# Patient Record
Sex: Female | Born: 2000 | Race: White | Hispanic: No | Marital: Single | Smoking: Current every day smoker
Health system: Southern US, Community
[De-identification: ages and names within clinical notes are randomized; demographics above are authoritative.]

## PROBLEM LIST (undated history)

## (undated) DIAGNOSIS — F329 Major depressive disorder, single episode, unspecified: Secondary | ICD-10-CM

## (undated) DIAGNOSIS — E119 Type 2 diabetes mellitus without complications: Secondary | ICD-10-CM

## (undated) DIAGNOSIS — J45909 Unspecified asthma, uncomplicated: Secondary | ICD-10-CM

## (undated) DIAGNOSIS — F32A Depression, unspecified: Secondary | ICD-10-CM

## (undated) DIAGNOSIS — IMO0002 Reserved for concepts with insufficient information to code with codable children: Secondary | ICD-10-CM

## (undated) DIAGNOSIS — F419 Anxiety disorder, unspecified: Secondary | ICD-10-CM

## (undated) HISTORY — PX: ADENOIDECTOMY: SUR15

## (undated) HISTORY — PX: OTHER SURGICAL HISTORY: SHX169

## (undated) HISTORY — DX: Anxiety disorder, unspecified: F41.9

## (undated) HISTORY — DX: Depression, unspecified: F32.A

## (undated) HISTORY — DX: Unspecified asthma, uncomplicated: J45.909

## (undated) HISTORY — DX: Type 2 diabetes mellitus without complications: E11.9

## (undated) HISTORY — DX: Major depressive disorder, single episode, unspecified: F32.9

## (undated) HISTORY — PX: TONSILLECTOMY: SUR1361

---
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
## 2018-06-05 NOTE — Progress Notes (Signed)
Subjective: QI:HKVQQVZDG care, ovarian pain HPI: Shelby Rubio is a 17 y.o. female presenting to clinic today for:  1.  Ovarian pain Patient reports a 4 to 28-monthhistory of ovarian pain.  She notes that it can occur on either side but the right seems to be more affected.  Pain seems worse within 2 weeks of her menstrual cycle and gradually gets worse as she approaches her menstrual cycle.  She reports associated nausea when the pain occurs.  Typically the pain lasts about 5 to 10 minutes then resolves.  Sometimes independently but sometimes she has to take ibuprofen to get it to go away.  The pain is sharp.  Often, she is left with residual aching and soreness.  She denies any abnormal vaginal discharge, dysuria, vaginal lesions, vaginal itching, vomiting, diarrhea.  She does occasionally have constipation.  Denies any rectal bleeding.  She is sexually active with one partner.  She does not use any contraception.  She was previously on OCPs but states that it seemed to make symptoms worse so she discontinued them several months ago.  Last menstrual cycle was 05/16/2018 and was very heavy.  She notes prior to that she had consecutive menstrual cycles every couple of weeks.  She has been screened for STDs in the past and reports she was negative.  Denies any known thyroid disorders in the family.  Denies any history of ovarian cancer, breast cancer, uterine cancer prostate cancers in the family.  No known family history of liver dysfunction or clotting disorders.  Past Medical History:  Diagnosis Date  . Behavior disorder    Past Surgical History:  Procedure Laterality Date  . ADENOIDECTOMY    . TONSILLECTOMY    . tubes in ears     Social History   Socioeconomic History  . Marital status: Single    Spouse name: Not on file  . Number of children: Not on file  . Years of education: Not on file  . Highest education level: Not on file  Occupational History  . Not on file  Social Needs  .  Financial resource strain: Not on file  . Food insecurity:    Worry: Not on file    Inability: Not on file  . Transportation needs:    Medical: Not on file    Non-medical: Not on file  Tobacco Use  . Smoking status: Never Smoker  . Smokeless tobacco: Never Used  Substance and Sexual Activity  . Alcohol use: No  . Drug use: No  . Sexual activity: Not on file  Lifestyle  . Physical activity:    Days per week: Not on file    Minutes per session: Not on file  . Stress: Not on file  Relationships  . Social connections:    Talks on phone: Not on file    Gets together: Not on file    Attends religious service: Not on file    Active member of club or organization: Not on file    Attends meetings of clubs or organizations: Not on file    Relationship status: Not on file  . Intimate partner violence:    Fear of current or ex partner: Not on file    Emotionally abused: Not on file    Physically abused: Not on file    Forced sexual activity: Not on file  Other Topics Concern  . Not on file  Social History Narrative  . Not on file   No outpatient medications have  been marked as taking for the 06/06/18 encounter (Appointment) with [name], DO.   Family History  Problem Relation Age of Onset  . Diabetes Other   . Asthma Sister   . Asthma Brother    Allergies  Allergen Reactions  . Amoxil [Amoxicillin] Hives  . Penicillins Hives     Health Maintenance: Declines flu shot  ROS: Per HPI  Objective: Office vital signs reviewed. BP 129/83   Pulse 93   Temp 98.3 F (36.8 C) (Oral)   Ht 5' 4"  (1.626 m)   Wt 210 lb (95.3 kg)   BMI 36.05 kg/m   Physical Examination:  General: Awake, alert, obese, No acute distress HEENT: Normal    Neck: No masses palpated. No lymphadenopathy; no thyromegaly or palpable nodules    Eyes: PERRLA, extraocular movement in tact, sclera white.  No exophthalmos Cardio: regular rate and rhythm, S1S2 heard, no murmurs appreciated Pulm:  clear to auscultation bilaterally, no wheezes, rhonchi or rales; normal work of breathing on room air GI: soft, non-tender, non-distended, bowel sounds present x4, no hepatomegaly, no splenomegaly, no masses GU: Mild tenderness to palpation over the right adnexa.  Assessment/ Plan: 17 y.o. female    1. Lower abdominal pain Mild right greater than left lower quadrant pain bilaterally.  This is over the ovaries.  I suspect that there is a component of mittelschmerz and generalized ovarian/uterine irritation related to menstrual cycles.  However, will obtain pelvic ultrasound to further evaluate.  Differential diagnosis considered include PID, UTI, referred pain from constipation.  Nothing on exam to suggest acute appendicitis.  Check labs. - Beta hCG quant (ref lab) - TSH - CMP14+EGFR - CBC - US Pelvic Complete With Transvaginal; Future  2. Abnormal menses We had a good discussion with regards to contraception and high risk sexual activity.  She does not desire pregnancy at this time.  Will obtain labs in pelvic ultrasound but will likely be proceeding with oral contraceptives.  Plan for Tri-Sprintec.  She will follow up in 3 months. - Beta hCG quant (ref lab) - TSH - CMP14+EGFR - CBC - US Pelvic Complete With Transvaginal; Future  3. Establishing care with new doctor, encounter for    [name], [place] [phone]

## 2018-06-06 ENCOUNTER — Ambulatory Visit (INDEPENDENT_AMBULATORY_CARE_PROVIDER_SITE_OTHER): Payer: Managed Care, Other (non HMO) | Admitting: Family Medicine

## 2018-06-06 ENCOUNTER — Encounter: Payer: Self-pay | Admitting: Family Medicine

## 2018-06-06 VITALS — BP 129/83 | HR 93 | Temp 98.3°F | Ht 64.0 in | Wt 210.0 lb

## 2018-06-06 DIAGNOSIS — R1030 Lower abdominal pain, unspecified: Secondary | ICD-10-CM

## 2018-06-06 DIAGNOSIS — Z7689 Persons encountering health services in other specified circumstances: Secondary | ICD-10-CM

## 2018-06-06 DIAGNOSIS — Z2821 Immunization not carried out because of patient refusal: Secondary | ICD-10-CM

## 2018-06-06 DIAGNOSIS — N926 Irregular menstruation, unspecified: Secondary | ICD-10-CM

## 2018-06-06 HISTORY — DX: Lower abdominal pain, unspecified: R10.30

## 2018-06-06 HISTORY — DX: Irregular menstruation, unspecified: N92.6

## 2018-06-06 HISTORY — DX: Immunization not carried out because of patient refusal: Z28.21

## 2018-06-06 NOTE — Patient Instructions (Signed)
You had labs performed today.  You will be contacted with the results of the labs once they are available, usually in the next 3 business days for routine lab work.   See me in 3 months for recheck, sooner if needed.  If your pain worsens, you have abnormal vaginal discharge or other worrisome symptoms, please seek medical attention.  Mittelschmerz Mittelschmerz is pain in the lower abdomen that is felt between periods.  The pain affects one side of the abdomen. The side that it affects may change from month to month.  The pain may be mild or severe.  The pain may last from minutes to hours. It does not last longer than 1-2 days.  The pain can happen with nausea and light vaginal bleeding.  Mittelschmerz is common among women. It is caused by the growth and release of an egg from an ovary, and it is a natural part of the ovulation cycle. It often happens about two weeks after a woman's period ends. Follow these instructions at home: Pay attention to any changes in your condition. Take these actions to help with your pain:  Try soaking in a hot bath.  Take over-the-counter and prescription medicines only as told by your health care provider.  Keep all follow-up visits as told by your health care provider. This is important.  Contact a health care provider if:  You have very bad pain most months.  You have abdominal pain that lasts longer than 24 hours.  Your pain medicine is not helping.  You have a fever.  You have nausea or vomiting that will not go away.  You miss your period.  You have vaginal bleeding between your periods that is heavier than spotting. This information is not intended to replace advice given to you by your health care provider. Make sure you discuss any questions you have with your health care provider. Document Released: 07/08/2002 Document Revised: 12/24/2015 Document Reviewed: 10/13/2014 Elsevier Interactive Patient Education  [employer].

## 2018-06-07 LAB — CMP14+EGFR
ALBUMIN: 4.5 g/dL (ref 3.5–5.5)
ALT: 16 IU/L (ref 0–32)
AST: 14 IU/L (ref 0–40)
Albumin/Globulin Ratio: 1.8 (ref 1.2–2.2)
Alkaline Phosphatase: 95 IU/L (ref 39–117)
BUN/Creatinine Ratio: 12 (ref 9–23)
BUN: 8 mg/dL (ref 6–20)
Bilirubin Total: 0.3 mg/dL (ref 0.0–1.2)
CO2: 20 mmol/L (ref 20–29)
CREATININE: 0.67 mg/dL (ref 0.57–1.00)
Calcium: 9.6 mg/dL (ref 8.7–10.2)
Chloride: 104 mmol/L (ref 96–106)
GFR calc non Af Amer: 128 mL/min/{1.73_m2} (ref >59)
GFR, EST AFRICAN AMERICAN: 147 mL/min/{1.73_m2} (ref >59)
GLOBULIN, TOTAL: 2.5 g/dL (ref 1.5–4.5)
GLUCOSE: 77 mg/dL (ref 65–99)
Potassium: 4.1 mmol/L (ref 3.5–5.2)
SODIUM: 140 mmol/L (ref 134–144)
TOTAL PROTEIN: 7 g/dL (ref 6.0–8.5)

## 2018-06-07 LAB — CBC
Hematocrit: 43.9 % (ref 34.0–46.6)
Hemoglobin: 15.2 g/dL (ref 11.1–15.9)
MCH: 31.3 pg (ref 26.6–33.0)
MCHC: 34.6 g/dL (ref 31.5–35.7)
MCV: 91 fL (ref 79–97)
PLATELETS: 363 10*3/uL (ref 150–450)
RBC: 4.85 x10E6/uL (ref 3.77–5.28)
RDW: 12.9 % (ref 12.3–15.4)
WBC: 10.3 10*3/uL (ref 3.4–10.8)

## 2018-06-07 LAB — BETA HCG QUANT (REF LAB): hCG Quant: <1 m[IU]/mL

## 2018-06-07 LAB — TSH: TSH: 1.66 u[IU]/mL (ref 0.450–4.500)

## 2018-06-08 ENCOUNTER — Other Ambulatory Visit: Payer: Self-pay | Admitting: Family Medicine

## 2018-06-08 ENCOUNTER — Encounter: Payer: Self-pay | Admitting: Family Medicine

## 2018-06-08 MED ORDER — NORGESTIM-ETH ESTRAD TRIPHASIC 0.18/0.215/0.25 MG-35 MCG PO TABS: 1.0000 | ORAL_TABLET | Freq: Every day | ORAL | 0 refills | Status: DC

## 2018-06-14 ENCOUNTER — Ambulatory Visit (HOSPITAL_COMMUNITY)
Admission: RE | Admit: 2018-06-14 | Discharge: 2018-06-14 | Disposition: A | Discharge status: Home / Self Care | Payer: Managed Care, Other (non HMO) | Source: Ambulatory Visit | Attending: Family Medicine | Admitting: Family Medicine

## 2018-06-14 DIAGNOSIS — R1030 Lower abdominal pain, unspecified: Secondary | ICD-10-CM | POA: Diagnosis present

## 2018-06-14 DIAGNOSIS — N926 Irregular menstruation, unspecified: Secondary | ICD-10-CM | POA: Insufficient documentation

## 2018-06-22 ENCOUNTER — Encounter: Payer: Self-pay | Admitting: Family Medicine

## 2018-06-22 MED ORDER — NORGESTIM-ETH ESTRAD TRIPHASIC 0.18/0.215/0.25 MG-35 MCG PO TABS: 1.0000 | ORAL_TABLET | Freq: Every day | ORAL | 0 refills | Status: DC

## 2018-07-05 ENCOUNTER — Encounter (HOSPITAL_COMMUNITY): Payer: Self-pay | Admitting: *Deleted

## 2018-07-05 ENCOUNTER — Emergency Department (HOSPITAL_COMMUNITY)
Admission: EM | Admit: 2018-07-05 | Discharge: 2018-07-05 | Disposition: A | Discharge status: Home / Self Care | Payer: Managed Care, Other (non HMO) | Attending: Emergency Medicine | Admitting: Emergency Medicine

## 2018-07-05 ENCOUNTER — Other Ambulatory Visit: Payer: Self-pay

## 2018-07-05 DIAGNOSIS — H9201 Otalgia, right ear: Secondary | ICD-10-CM | POA: Diagnosis not present

## 2018-07-05 DIAGNOSIS — F17210 Nicotine dependence, cigarettes, uncomplicated: Secondary | ICD-10-CM | POA: Diagnosis not present

## 2018-07-05 MED ORDER — AZITHROMYCIN 250 MG PO TABS: ORAL_TABLET | ORAL | 0 refills | Status: DC

## 2018-07-05 MED ORDER — IBUPROFEN 400 MG PO TABS
400.0000 mg | ORAL_TABLET | Freq: Once | ORAL | Status: AC
  Administered 2018-07-05: 400 mg via ORAL
  Filled 2018-07-05: qty 1

## 2018-07-05 NOTE — ED Provider Notes (Signed)
[facility] EMERGENCY DEPARTMENT Provider Note   CSN: [id number] Arrival date & time: 07/05/18  0046     History   Chief Complaint Chief Complaint  Patient presents with  . Otalgia    HPI Shelby Rubio is a 17 y.o. female.  The history is provided by the patient.  Otalgia  This is a new problem. The current episode started 1 to 2 hours ago. There is pain in the right ear. The problem occurs constantly. The problem has been gradually worsening. There has been no fever. The pain is moderate. Pertinent negatives include no ear discharge and no cough.  Patient reports onset of right ear pain.  She reports recent symptoms of a sinus infection, that she had abrupt onset of ear pain.  No drainage.  No fever.  Past Medical History:  Diagnosis Date  . Behavior disorder   . Depression     Patient Active Problem List   Diagnosis Date Noted  . Refused influenza vaccine 06/06/2018  . Abnormal menses 06/06/2018  . Lower abdominal pain 06/06/2018  . Moderate episode of recurrent major depressive disorder (HCC) 06/10/2016    Past Surgical History:  Procedure Laterality Date  . ADENOIDECTOMY    . TONSILLECTOMY    . tubes in ears       OB History    Gravida      Para      Term      Preterm      AB      Living  0     SAB      TAB      Ectopic      Multiple      Live Births               Home Medications    Prior to Admission medications   Medication Sig Start Date End Date Taking? Authorizing Provider  azithromycin (ZITHROMAX) 250 MG tablet Take first 2 tablets together, then 1 every day until finished. 07/05/18   Zadie Rhine, MD  Norgestimate-Ethinyl Estradiol Triphasic (ORTHO TRI-CYCLEN, 28,) 0.18/0.215/0.25 MG-35 MCG tablet Take 1 tablet by mouth daily. 06/22/18   Bennie Pierini, FNP    Family History Family History  Problem Relation Age of Onset  . Diabetes Other   . Asthma Sister   . Asthma Brother   . Hypertension Mother      Social History Social History   Tobacco Use  . Smoking status: Current Every Day Smoker    Packs/day: 0.50    Years: 0.50    Pack years: 0.25    Types: Cigarettes  . Smokeless tobacco: Never Used  Substance Use Topics  . Alcohol use: No  . Drug use: No     Allergies   Amoxil [amoxicillin] and Penicillins   Review of Systems Review of Systems  Constitutional: Negative for fever.  HENT: Positive for ear pain. Negative for ear discharge.   Respiratory: Negative for cough.      Physical Exam Updated Vital Signs BP (!) 139/91 (BP Location: Right Arm)   Pulse 95   Temp 98.8 F (37.1 C) (Oral)   Resp 17   Ht 1.626 m (5\' 4" )   Wt 94.3 kg   LMP 07/05/2018   SpO2 100%   BMI 35.70 kg/m   Physical Exam  CONSTITUTIONAL: Well developed/well nourished HEAD: Normocephalic/atraumatic EYES: EOMI/PERRL ENMT: Mucous membranes moist, right TM mostly occluded by cerumen, no mastoid tenderness, ears symmetric, no erythema noted NECK: supple no meningeal  signs SPINE/BACK:entire spine nontender CV: S1/S2 noted, no murmurs/rubs/gallops noted LUNGS: Lungs are clear to auscultation bilaterally, no apparent distress ABDOMEN: soft, nontender NEURO: Pt is awake/alert/appropriate, moves all extremitiesx4.  No facial droop.   EXTREMITIES:  full ROM SKIN: warm, color normal PSYCH: no abnormalities of mood noted, alert and oriented to situation  ED Treatments / Results  Labs (all labs ordered are listed, but only abnormal results are displayed) Labs Reviewed - No data to display  EKG None  Radiology No results found.  Procedures Procedures   Medications Ordered in ED Medications  ibuprofen (ADVIL,MOTRIN) tablet 400 mg (400 mg Oral Given 07/05/18 0125)     Initial Impression / Assessment and Plan / ED Course  I have reviewed the triage vital signs and the nursing notes.      Suspicion for otitis media.  However I was unable to fully visualize TM, due to cerumen.   She will first attempt to disimpact the cerumen at home, and if pain continues I advised her to start an antibiotic. If her pain resolves, she will not need the azithromycin I advised her to use other means of contraception while taking antibiotic as this may decrease effectiveness of OCPs Final Clinical Impressions(s) / ED Diagnoses   Final diagnoses:  Otalgia of right ear    ED Discharge Orders         Ordered    azithromycin (ZITHROMAX) 250 MG tablet     07/05/18 0116           Zadie RhineWickline, Toure Edmonds, MD 07/05/18 [phone]

## 2018-07-05 NOTE — ED Notes (Signed)
Pt ambulatory to waiting room. Pt verbalized understanding of discharge instructions.   

## 2018-07-05 NOTE — ED Triage Notes (Signed)
Pt c/o right ear pain that started tonight,  

## 2018-09-10 ENCOUNTER — Ambulatory Visit (INDEPENDENT_AMBULATORY_CARE_PROVIDER_SITE_OTHER): Payer: Managed Care, Other (non HMO) | Admitting: Family Medicine

## 2018-09-10 ENCOUNTER — Encounter: Payer: Self-pay | Admitting: Family Medicine

## 2018-09-10 VITALS — BP 108/74 | HR 87 | Temp 97.8°F | Ht 64.0 in | Wt 209.0 lb

## 2018-09-10 DIAGNOSIS — J069 Acute upper respiratory infection, unspecified: Secondary | ICD-10-CM

## 2018-09-10 DIAGNOSIS — N926 Irregular menstruation, unspecified: Secondary | ICD-10-CM

## 2018-09-10 MED ORDER — NORGESTIM-ETH ESTRAD TRIPHASIC 0.18/0.215/0.25 MG-35 MCG PO TABS: 1.0000 | ORAL_TABLET | Freq: Every day | ORAL | 3 refills | Status: DC

## 2018-09-10 MED ORDER — BENZONATATE 100 MG PO CAPS: 100.0000 mg | ORAL_CAPSULE | Freq: Three times a day (TID) | ORAL | 0 refills | Status: DC | PRN

## 2018-09-10 NOTE — Patient Instructions (Signed)
Glad to hear that the new pill is working well!  I have sent in a year's worth of refills.  See me back in 1 year, sooner if needed.  I have also sent in a cough medication you can use for your cold.  It appears that you have a viral upper respiratory infection (cold).  Cold symptoms can last up to 2 weeks.    - Get plenty of rest and drink plenty of fluids. - Try to breathe moist air. Use a cold mist humidifier. - Consume warm fluids (soup or tea) to provide relief for a stuffy nose and to loosen phlegm. - For nasal stuffiness, try saline nasal spray or a Neti Pot. Afrin nasal spray can also be used but this product should not be used longer than 3 days or it will cause rebound nasal stuffiness (worsening nasal congestion). - For sore throat pain relief: use chloraseptic spray, suck on throat lozenges, hard candy or popsicles; gargle with warm salt water (1/4 tsp. salt per 8 oz. of water); and eat soft, bland foods. - Eat a well-balanced diet. If you cannot, ensure you are getting enough nutrients by taking a daily multivitamin. - Avoid dairy products, as they can thicken phlegm. - Avoid alcohol, as it impairs your body's immune system.  CONTACT YOUR DOCTOR IF YOU EXPERIENCE ANY OF THE FOLLOWING: - High fever - Ear pain - Sinus-type headache - Unusually severe cold symptoms - Cough that gets worse while other cold symptoms improve - Flare up of any chronic lung problem, such as asthma - Your symptoms persist longer than 2 weeks

## 2018-09-10 NOTE — Progress Notes (Signed)
Subjective: CC: Abnormal menses/ pelvic pain HPI: Shelby Rubio is a 18 y.o. female presenting to clinic today for:  1.  Abnormal menses Patient was seen in November at that time complained about a 4 to 51-month history of "ovarian pain".  Ovarian pain was predominantly on the right.  She had noted associated nausea and gradual worsening of the pain about 2 weeks prior to menstrual cycles.  She reported heavy menses.  She was started on Ortho Tri-Cyclen and follows up today for 58-month recheck.  She notes substantial improvement in abdominal pain associated with menstrual cycle, she also reports that heaviness has improved substantially.  She now has a normal period that occurs 1 time per month lasting about 5 days with normal flow.  She has some cramping but notes that this is quite a bit better than it was previously.  She would like refills on the Ortho Tri-Cyclen.  2.  URI Patient reports onset of cough, cold symptoms about 1 week ago.  No shortness of breath, wheeze or hemoptysis.  She reports that she has been using Mucinex with some improvement.  She has associated stuffy nose and had some scant bleeding from the nose recently.  Not currently using any nasal sprays including saline.   Past Medical History:  Diagnosis Date  . Behavior disorder   . Depression    Past Surgical History:  Procedure Laterality Date  . ADENOIDECTOMY    . TONSILLECTOMY    . tubes in ears     Social History   Socioeconomic History  . Marital status: Single    Spouse name: Not on file  . Number of children: Not on file  . Years of education: Not on file  . Highest education level: Not on file  Occupational History  . Not on file  Social Needs  . Financial resource strain: Not on file  . Food insecurity:    Worry: Not on file    Inability: Not on file  . Transportation needs:    Medical: Not on file    Non-medical: Not on file  Tobacco Use  . Smoking status: Current Every Day Smoker   Packs/day: 0.50    Years: 0.50    Pack years: 0.25    Types: Cigarettes  . Smokeless tobacco: Never Used  Substance and Sexual Activity  . Alcohol use: No  . Drug use: No  . Sexual activity: Not on file  Lifestyle  . Physical activity:    Days per week: Not on file    Minutes per session: Not on file  . Stress: Not on file  Relationships  . Social connections:    Talks on phone: Not on file    Gets together: Not on file    Attends religious service: Not on file    Active member of club or organization: Not on file    Attends meetings of clubs or organizations: Not on file    Relationship status: Not on file  . Intimate partner violence:    Fear of current or ex partner: Not on file    Emotionally abused: Not on file    Physically abused: Not on file    Forced sexual activity: Not on file  Other Topics Concern  . Not on file  Social History Narrative  . Not on file   No outpatient medications have been marked as taking for the 09/10/18 encounter (Appointment) with Raliegh Ip, DO.   Family History  Problem Relation Age  of Onset  . Diabetes Other   . Asthma Sister   . Asthma Brother   . Hypertension Mother    Allergies  Allergen Reactions  . Amoxil [Amoxicillin] Hives  . Penicillins Hives     ROS: Per HPI  Objective: Office vital signs reviewed. BP 108/74   Pulse 87   Temp 97.8 F (36.6 C) (Oral)   Ht 5\' 4"  (1.626 m)   Wt 209 lb (94.8 kg)   BMI 35.87 kg/m   Physical Examination:  General: Awake, alert, obese, No acute distress HEENT: Normal    Eyes: PERRLA, extraocular movement in tact, sclera white.  No exophthalmos    Ears: Tympanic membranes intact bilaterally.  No bulging or erythema.  No purulence behind tympanic membrane    Mouth: Moist mucous membranes.  No oropharyngeal erythema.  No tonsillar exudates or enlargement. Cardio: regular rate and rhythm, S1S2 heard, no murmurs appreciated Pulm: clear to auscultation bilaterally, no wheezes,  rhonchi or rales; normal work of breathing on room air  Assessment/ Plan: 18 y.o. female   1. Abnormal menses Under excellent control with new OCP.  Ortho Tri-Cyclen refilled x1 year.  She may follow-up in 1 year or sooner if needed.  2. URI with cough and congestion She is afebrile and well-appearing.  I suspect this is a viral process.  I have recommended continuing Mucinex and over-the-counter therapies.  Consider adding nasal saline spray for nasal dryness.  We also discussed consideration for Claritin.  Tessalon Perles have been prescribed for cough.  Reasons for return discussed.  Handout provided.  Follow-up PRN.  Meds ordered this encounter  Medications  . Norgestimate-Ethinyl Estradiol Triphasic (ORTHO TRI-CYCLEN, 28,) 0.18/0.215/0.25 MG-35 MCG tablet    Sig: Take 1 tablet by mouth daily.    Dispense:  3 Package    Refill:  3  . benzonatate (TESSALON PERLES) 100 MG capsule    Sig: Take 1 capsule (100 mg total) by mouth 3 (three) times daily as needed.    Dispense:  20 capsule    Refill:  0   Ailey Wessling Hulen SkainsM Takiah Maiden, DO [facility] [phone]

## 2018-11-19 ENCOUNTER — Telehealth: Payer: Self-pay

## 2018-11-19 NOTE — Telephone Encounter (Signed)
[facility] - Declined services  

## 2018-11-19 NOTE — Telephone Encounter (Signed)
Patient declined [facility] services 

## 2018-12-21 ENCOUNTER — Encounter: Payer: Self-pay | Admitting: Family

## 2018-12-21 ENCOUNTER — Telehealth: Payer: Self-pay | Admitting: Family Medicine

## 2018-12-21 ENCOUNTER — Other Ambulatory Visit: Payer: Self-pay

## 2018-12-21 ENCOUNTER — Ambulatory Visit (INDEPENDENT_AMBULATORY_CARE_PROVIDER_SITE_OTHER): Payer: Managed Care, Other (non HMO) | Admitting: Family

## 2018-12-21 DIAGNOSIS — F17200 Nicotine dependence, unspecified, uncomplicated: Secondary | ICD-10-CM | POA: Diagnosis not present

## 2018-12-21 DIAGNOSIS — R05 Cough: Secondary | ICD-10-CM

## 2018-12-21 DIAGNOSIS — R059 Cough, unspecified: Secondary | ICD-10-CM

## 2018-12-21 MED ORDER — BENZONATATE 100 MG PO CAPS: 100.0000 mg | ORAL_CAPSULE | Freq: Three times a day (TID) | ORAL | 0 refills | Status: DC | PRN

## 2018-12-21 MED ORDER — CETIRIZINE HCL 10 MG PO TABS: 10.0000 mg | ORAL_TABLET | Freq: Every day | ORAL | 11 refills | Status: DC

## 2018-12-21 MED ORDER — FLUTICASONE PROPIONATE 50 MCG/ACT NA SUSP: 2.0000 | Freq: Every day | NASAL | 6 refills | Status: DC

## 2018-12-21 NOTE — Progress Notes (Signed)
   Virtual Visit via telephone Note  I connected with Shelby Rubio on 12/21/18 at 4:37 pm by telephone and verified that I am speaking with the correct person using two identifiers. Shelby Rubio is currently located at home and no one is currently with her during visit. The provider, Jannifer Rodney, FNP is located in their office at time of visit.  I discussed the limitations, risks, security and privacy concerns of performing an evaluation and management service by telephone and the availability of in person appointments. I also discussed with the patient that there may be a patient responsible charge related to this service. The patient expressed understanding and agreed to proceed.   History and Present Illness:  Cough  This is a new problem. The current episode started in the past 7 days. The problem has been gradually worsening. The problem occurs every few minutes. The cough is productive of sputum. Associated symptoms include a fever (99), headaches, nasal congestion, postnasal drip, a sore throat (scratchy) and wheezing. Pertinent negatives include no chills, ear congestion, ear pain, myalgias or shortness of breath. Associated symptoms comments: Sinus pressure . The symptoms are aggravated by lying down. Risk factors for lung disease include smoking/tobacco exposure. She has tried rest and OTC cough suppressant for the symptoms. The treatment provided mild relief. There is no history of asthma.      Review of Systems  Constitutional: Positive for fever (99). Negative for chills.  HENT: Positive for postnasal drip and sore throat (scratchy). Negative for ear pain.   Respiratory: Positive for cough and wheezing. Negative for shortness of breath.   Musculoskeletal: Negative for myalgias.  Neurological: Positive for headaches.  All other systems reviewed and are negative.    Observations/Objective: No SOB or distress   Assessment and Plan: 1. Cough - Take meds as prescribed - Use a  cool mist humidifier  -Use saline nose sprays frequently -Force fluids -For any cough or congestion  Use plain Mucinex- regular strength or max strength is fine -For fever or aces or pains- take tylenol or ibuprofen. -Throat lozenges if help -RTO if symptoms worsens or do not improve - benzonatate (TESSALON PERLES) 100 MG capsule; Take 1 capsule (100 mg total) by mouth 3 (three) times daily as needed.  Dispense: 20 capsule; Refill: 0 - fluticasone (FLONASE) 50 MCG/ACT nasal spray; Place 2 sprays into both nostrils daily.  Dispense: 16 g; Refill: 6 - cetirizine (ZYRTEC) 10 MG tablet; Take 1 tablet (10 mg total) by mouth daily.  Dispense: 30 tablet; Refill: 11  2. Current smoker Smoking cessation discussed      I discussed the assessment and treatment plan with the patient. The patient was provided an opportunity to ask questions and all were answered. The patient agreed with the plan and demonstrated an understanding of the instructions.   The patient was advised to call back or seek an in-person evaluation if the symptoms worsen or if the condition fails to improve as anticipated.  The above assessment and management plan was discussed with the patient. The patient verbalized understanding of and has agreed to the management plan. Patient is aware to call the clinic if symptoms persist or worsen. Patient is aware when to return to the clinic for a follow-up visit. Patient educated on when it is appropriate to go to the emergency department.   Time call ended:  4:46 pm  I provided 9 minutes of non-face-to-face time during this encounter.    Jannifer Rodney, FNP

## 2018-12-26 ENCOUNTER — Encounter: Payer: Self-pay | Admitting: Family Medicine

## 2018-12-26 ENCOUNTER — Other Ambulatory Visit: Payer: Self-pay

## 2018-12-26 ENCOUNTER — Telehealth: Payer: Self-pay | Admitting: Family Medicine

## 2018-12-26 ENCOUNTER — Other Ambulatory Visit: Payer: Managed Care, Other (non HMO)

## 2018-12-26 ENCOUNTER — Ambulatory Visit (INDEPENDENT_AMBULATORY_CARE_PROVIDER_SITE_OTHER): Payer: Managed Care, Other (non HMO) | Admitting: Family Medicine

## 2018-12-26 DIAGNOSIS — J988 Other specified respiratory disorders: Secondary | ICD-10-CM

## 2018-12-26 DIAGNOSIS — Z20822 Contact with and (suspected) exposure to covid-19: Secondary | ICD-10-CM

## 2018-12-26 DIAGNOSIS — B9789 Other viral agents as the cause of diseases classified elsewhere: Secondary | ICD-10-CM

## 2018-12-26 NOTE — Progress Notes (Signed)
Virtual Visit via telephone Note  I connected with Shelby Rubio on 12/26/18 at 1529 by telephone and verified that I am speaking with the correct person using two identifiers. Shelby Rubio is currently located at home and no other people are currently with her during visit. The provider, Elige Radon Dettinger, MD is located in their office at time of visit.  Call ended at 1340  I discussed the limitations, risks, security and privacy concerns of performing an evaluation and management service by telephone and the availability of in person appointments. I also discussed with the patient that there may be a patient responsible charge related to this service. The patient expressed understanding and agreed to proceed.   History and Present Illness: Patient has a cough and congestion that has been going 1 week.  Patient has temp of 99.1 but no true fever.  Patient has runny nose and cough and it has not gotten any better.  She has been using mucinex and flonase and zyrtec.  Patient denies any shortness of breath or wheezing.  Mother and sister-in-law are sick with same symptoms.  One of her co-workers came in sick and that is where it started.   No diagnosis found.  Outpatient Encounter Medications as of 12/26/2018  Medication Sig  . benzonatate (TESSALON PERLES) 100 MG capsule Take 1 capsule (100 mg total) by mouth 3 (three) times daily as needed.  . cetirizine (ZYRTEC) 10 MG tablet Take 1 tablet (10 mg total) by mouth daily.  . fluticasone (FLONASE) 50 MCG/ACT nasal spray Place 2 sprays into both nostrils daily.  . Norgestimate-Ethinyl Estradiol Triphasic (ORTHO TRI-CYCLEN, 28,) 0.18/0.215/0.25 MG-35 MCG tablet Take 1 tablet by mouth daily.   No facility-administered encounter medications on file as of 12/26/2018.     Review of Systems  Constitutional: Positive for fever. Negative for chills.  HENT: Positive for congestion, postnasal drip, rhinorrhea, sinus pressure, sneezing and sore throat.  Negative for ear discharge and ear pain.   Eyes: Negative for pain, redness and visual disturbance.  Respiratory: Positive for cough. Negative for chest tightness and shortness of breath.   Cardiovascular: Negative for chest pain and leg swelling.  Genitourinary: Negative for difficulty urinating and dysuria.  Musculoskeletal: Negative for back pain, gait problem and myalgias.  Skin: Negative for rash.  Neurological: Negative for light-headedness and headaches.  Psychiatric/Behavioral: Negative for agitation and behavioral problems.  All other systems reviewed and are negative.   Observations/Objective: Patient sounds comfortable and in no acute distress  Assessment and Plan: Problem List Items Addressed This Visit    None    Visit Diagnoses    Viral respiratory illness    -  Primary      Continue current management, will send for PEC testing Follow Up Instructions:  As needed, managed conservatively and self quarantine   I discussed the assessment and treatment plan with the patient. The patient was provided an opportunity to ask questions and all were answered. The patient agreed with the plan and demonstrated an understanding of the instructions.   The patient was advised to call back or seek an in-person evaluation if the symptoms worsen or if the condition fails to improve as anticipated.  The above assessment and management plan was discussed with the patient. The patient verbalized understanding of and has agreed to the management plan. Patient is aware to call the clinic if symptoms persist or worsen. Patient is aware when to return to the clinic for a follow-up visit. Patient  educated on when it is appropriate to go to the emergency department.    I provided 11 minutes of non-face-to-face time during this encounter.    Nils PyleJoshua A Dettinger, MD

## 2018-12-26 NOTE — Addendum Note (Signed)
Addended by: Amado Coe on: 12/26/2018 04:20 PM   Modules accepted: Orders

## 2018-12-26 NOTE — Telephone Encounter (Signed)
Pt has been scheduled for Covid testing. Referred by Dr. Ivin Booty Dettinger   Reached pt at [facility].

## 2018-12-27 ENCOUNTER — Other Ambulatory Visit: Payer: Self-pay | Admitting: *Deleted

## 2018-12-27 ENCOUNTER — Other Ambulatory Visit: Payer: Managed Care, Other (non HMO)

## 2018-12-27 DIAGNOSIS — Z20822 Contact with and (suspected) exposure to covid-19: Secondary | ICD-10-CM

## 2018-12-29 ENCOUNTER — Ambulatory Visit (INDEPENDENT_AMBULATORY_CARE_PROVIDER_SITE_OTHER)
Admission: RE | Admit: 2018-12-29 | Discharge: 2018-12-29 | Disposition: A | Discharge status: Home / Self Care | Payer: Managed Care, Other (non HMO) | Source: Ambulatory Visit

## 2018-12-29 ENCOUNTER — Telehealth: Payer: Managed Care, Other (non HMO)

## 2018-12-29 DIAGNOSIS — R05 Cough: Secondary | ICD-10-CM | POA: Diagnosis not present

## 2018-12-29 DIAGNOSIS — R0981 Nasal congestion: Secondary | ICD-10-CM

## 2018-12-29 DIAGNOSIS — R059 Cough, unspecified: Secondary | ICD-10-CM

## 2018-12-29 LAB — NOVEL CORONAVIRUS, NAA: SARS-CoV-2, NAA: NOT DETECTED

## 2018-12-29 MED ORDER — DOXYCYCLINE HYCLATE 100 MG PO CAPS: 100.0000 mg | ORAL_CAPSULE | Freq: Two times a day (BID) | ORAL | 0 refills | Status: AC

## 2018-12-29 NOTE — Discharge Instructions (Signed)
Push fluids to ensure adequate hydration and keep secretions thin.  Tylenol and/or ibuprofen as needed for pain or fevers.  May continue with previously prescribed medications.  Complete course of antibiotics.  If no improvement or if worsening please be seen in person.

## 2018-12-29 NOTE — ED Provider Notes (Signed)
Virtual Visit via Video Note:  LODELL MAGNIN  initiated request for Telemedicine visit with [facility] team. I connected with JOWANNA KREAMER  on 12/30/2018 at 9:54 AM  for a synchronized telemedicine visit using a video enabled HIPPA compliant telemedicine application. I verified that I am speaking with Charlsie Merles  using two identifiers. Georgetta Haber, NP  was physically located in a [facility] Urgent care site and KHALEYA AWADALLA was located at a different location.   The limitations of evaluation and management by telemedicine as well as the availability of in-person appointments were discussed. Patient was informed that she  may incur a bill ( including co-pay) for this virtual visit encounter. RAGINA GORDILLO  expressed understanding and gave verbal consent to proceed with virtual visit.     History of Present Illness:Shelby Rubio  is a 18 y.o. female presents with complaints of cough, runny nose last week. Flonase, zyrtec and tessalon was started, saw PCP 5/27.  No better since then. Covid testing completed, negative. Still with cough runny nose. No worse, no better. Temp of 99.1 occasionally, intermittently. No shortness of breath. No sore throat or ear pain. People at work with cough symptoms, mother ended up ill also and was treated with antibiotics. No facial pressure, no headaches. No gi symptoms. Tried sudafed and OTC cold and flu didn't help either. Denies any known previous, but has had sinus infections in the past.   Past Medical History:  Diagnosis Date  . Behavior disorder   . Depression     Allergies  Allergen Reactions  . Amoxil [Amoxicillin] Hives  . Penicillins Hives        Observations/Objective:   Assessment and Plan: Alert, oriented, non toxic appearing. Speaking in clear sentences without difficulty. No apparent shortness of breath or increased work of breathing.   Follow Up Instructions:  Persistent symptoms, nearly 10 days of symptoms, mother and ill  coworkers as well. Supportive treatments have not helped with symptoms. Opted to initiate antibiotics at this time. Return precautions provided. Patient verbalized understanding and agreeable to plan.     I discussed the assessment and treatment plan with the patient. The patient was provided an opportunity to ask questions and all were answered. The patient agreed with the plan and demonstrated an understanding of the instructions.   The patient was advised to call back or seek an in-person evaluation if the symptoms worsen or if the condition fails to improve as anticipated.  I provided 13 minutes of non-face-to-face time during this encounter.    Georgetta Haber, NP  12/30/2018 9:54 AM         Georgetta Haber, NP 12/30/18 [phone]

## 2019-03-01 ENCOUNTER — Encounter: Payer: Self-pay | Admitting: Family Medicine

## 2019-03-01 ENCOUNTER — Ambulatory Visit (INDEPENDENT_AMBULATORY_CARE_PROVIDER_SITE_OTHER): Payer: Managed Care, Other (non HMO) | Admitting: Family Medicine

## 2019-03-01 DIAGNOSIS — Z8639 Personal history of other endocrine, nutritional and metabolic disease: Secondary | ICD-10-CM

## 2019-03-01 DIAGNOSIS — R42 Dizziness and giddiness: Secondary | ICD-10-CM

## 2019-03-01 HISTORY — DX: Personal history of other endocrine, nutritional and metabolic disease: Z86.39

## 2019-03-01 HISTORY — DX: Dizziness and giddiness: R42

## 2019-03-01 MED ORDER — BLOOD GLUCOSE METER KIT: PACK | 0 refills | Status: DC

## 2019-03-01 NOTE — Progress Notes (Signed)
Virtual Visit via telephone Note Due to COVID-19 pandemic this visit was conducted virtually. This visit type was conducted due to national recommendations for restrictions regarding the COVID-19 Pandemic (e.g. social distancing, sheltering in place) in an effort to limit this patient's exposure and mitigate transmission in our community. All issues noted in this document were discussed and addressed.  A physical exam was not performed with this format.   I connected with Shelby Rubio on 03/01/19 at 0930 by telephone and verified that I am speaking with the correct person using two identifiers. Shelby Rubio is currently located at home and family is currently with them during visit. The provider, Monia Pouch, FNP is located in their office at time of visit.  I discussed the limitations, risks, security and privacy concerns of performing an evaluation and management service by telephone and the availability of in person appointments. I also discussed with the patient that there may be a patient responsible charge related to this service. The patient expressed understanding and agreed to proceed.  Subjective:  Patient ID: Shelby Rubio, female    DOB: [birth year], 18 y.o.   MRN: [id number]  Chief Complaint:  Dizziness (one month)   HPI: Shelby Rubio is a 18 y.o. female presenting on 03/01/2019 for Dizziness (one month)   Pt reports intermittent dizzy spells. States this has happened several times over the last few months. Notices them more when she has not eaten anything. States she feels light headed, fatigued, nauseated, and thirsty when the spells happen. Pt states she does have cold sweats with the episodes. She denies weakness, focal deficits, slurred speech, shortness of breath, chest pain, palpitations, or syncope. States the episodes improve after eating or drinking something with sugar. She states her dad has hypoglycemia and several other family members have DM.  Dizziness This is a  recurrent problem. The current episode started more than 1 month ago. The problem occurs intermittently. The problem has been waxing and waning. Associated symptoms include diaphoresis, fatigue, nausea and vertigo. Pertinent negatives include no abdominal pain, anorexia, arthralgias, change in bowel habit, chest pain, chills, congestion, coughing, fever, headaches, joint swelling, myalgias, neck pain, numbness, rash, sore throat, swollen glands, urinary symptoms, visual change, vomiting or weakness. Nothing aggravates the symptoms. She has tried drinking and eating for the symptoms. The treatment provided significant relief.     Relevant past medical, surgical, family, and social history reviewed and updated as indicated.  Allergies and medications reviewed and updated.   Past Medical History:  Diagnosis Date  . Behavior disorder   . Depression     Past Surgical History:  Procedure Laterality Date  . ADENOIDECTOMY    . TONSILLECTOMY    . tubes in ears      Social History   Socioeconomic History  . Marital status: Single    Spouse name: Not on file  . Number of children: Not on file  . Years of education: Not on file  . Highest education level: Not on file  Occupational History  . Not on file  Social Needs  . Financial resource strain: Not on file  . Food insecurity    Worry: Not on file    Inability: Not on file  . Transportation needs    Medical: Not on file    Non-medical: Not on file  Tobacco Use  . Smoking status: Current Every Day Smoker    Packs/day: 0.50    Years: 0.50    Pack years: 0.25  Types: Cigarettes  . Smokeless tobacco: Never Used  Substance and Sexual Activity  . Alcohol use: No  . Drug use: No  . Sexual activity: Not on file  Lifestyle  . Physical activity    Days per week: Not on file    Minutes per session: Not on file  . Stress: Not on file  Relationships  . Social Herbalist on phone: Not on file    Gets together: Not on file     Attends religious service: Not on file    Active member of club or organization: Not on file    Attends meetings of clubs or organizations: Not on file    Relationship status: Not on file  . Intimate partner violence    Fear of current or ex partner: Not on file    Emotionally abused: Not on file    Physically abused: Not on file    Forced sexual activity: Not on file  Other Topics Concern  . Not on file  Social History Narrative  . Not on file    Outpatient Encounter Medications as of 03/01/2019  Medication Sig  . benzonatate (TESSALON PERLES) 100 MG capsule Take 1 capsule (100 mg total) by mouth 3 (three) times daily as needed.  . blood glucose meter kit and supplies Dispense based on patient and insurance preference. Use up to four times daily as directed. (FOR ICD-10 E10.9, E11.9).  . cetirizine (ZYRTEC) 10 MG tablet Take 1 tablet (10 mg total) by mouth daily.  . fluticasone (FLONASE) 50 MCG/ACT nasal spray Place 2 sprays into both nostrils daily.  . Norgestimate-Ethinyl Estradiol Triphasic (ORTHO TRI-CYCLEN, 28,) 0.18/0.215/0.25 MG-35 MCG tablet Take 1 tablet by mouth daily.   No facility-administered encounter medications on file as of 03/01/2019.     Allergies  Allergen Reactions  . Amoxil [Amoxicillin] Hives  . Penicillins Hives    Review of Systems  Constitutional: Positive for diaphoresis and fatigue. Negative for activity change, appetite change, chills, fever and unexpected weight change.  HENT: Negative for congestion and sore throat.   Eyes: Negative for photophobia and visual disturbance.  Respiratory: Negative for cough, chest tightness and shortness of breath.   Cardiovascular: Negative for chest pain, palpitations and leg swelling.  Gastrointestinal: Positive for nausea. Negative for abdominal pain, anal bleeding, anorexia, blood in stool, change in bowel habit, constipation, diarrhea, rectal pain and vomiting.  Endocrine: Positive for polyuria. Negative for  polydipsia and polyphagia.  Genitourinary: Negative for decreased urine volume and difficulty urinating.  Musculoskeletal: Negative for arthralgias, joint swelling, myalgias and neck pain.  Skin: Negative for color change, pallor and rash.  Neurological: Positive for dizziness, vertigo and light-headedness. Negative for tremors, seizures, syncope, facial asymmetry, speech difficulty, weakness, numbness and headaches.  Psychiatric/Behavioral: Negative for confusion.  All other systems reviewed and are negative.        Observations/Objective: No vital signs or physical exam, this was a telephone or virtual health encounter.  Pt alert and oriented, answers all questions appropriately, and able to speak in full sentences.    Assessment and Plan: Latrish was seen today for dizziness.  Diagnoses and all orders for this visit:  Dizziness History of hypoglycemia Dizzy spells associated with fasting. Improvement with eating and drinking. No red flags concerning for acute cardiovascular or neurovascular events. Reported symptoms consistent with hypoglycemic episodes. Symptomatic care discussed. Pt aware to eat at least 3 meals per day and to keep a snack on her at all times. Will  order glucose monitoring kit. Pt instructed to check blood sugars randomly and with dizzy episodes and keep a record to bring to follow up appointment. Pt aware of symptoms that require emergent evaluation and treatment.  -     blood glucose meter kit and supplies; Dispense based on patient and insurance preference. Use up to four times daily as directed. (FOR ICD-10 E10.9, E11.9).     Follow Up Instructions: Return for ? hypoglycemia, dizziness.    I discussed the assessment and treatment plan with the patient. The patient was provided an opportunity to ask questions and all were answered. The patient agreed with the plan and demonstrated an understanding of the instructions.   The patient was advised to call back or  seek an in-person evaluation if the symptoms worsen or if the condition fails to improve as anticipated.  The above assessment and management plan was discussed with the patient. The patient verbalized understanding of and has agreed to the management plan. Patient is aware to call the clinic if symptoms persist or worsen. Patient is aware when to return to the clinic for a follow-up visit. Patient educated on when it is appropriate to go to the emergency department.    I provided 15 minutes of non-face-to-face time during this encounter. The call started at 0930. The call ended at 0945. The other time was used for coordination of care.    Monia Pouch, FNP-C [facility] [address] [place], [place] [address] [phone] 03/01/19

## 2019-03-19 ENCOUNTER — Other Ambulatory Visit: Payer: Self-pay | Admitting: Family Medicine

## 2019-03-26 ENCOUNTER — Other Ambulatory Visit: Payer: Self-pay

## 2019-03-27 ENCOUNTER — Encounter: Payer: Self-pay | Admitting: Family Medicine

## 2019-03-27 ENCOUNTER — Ambulatory Visit (INDEPENDENT_AMBULATORY_CARE_PROVIDER_SITE_OTHER): Payer: Managed Care, Other (non HMO) | Admitting: Family Medicine

## 2019-03-27 VITALS — BP 134/75 | HR 103 | Temp 99.4°F | Ht 64.0 in | Wt 211.0 lb

## 2019-03-27 DIAGNOSIS — F331 Major depressive disorder, recurrent, moderate: Secondary | ICD-10-CM

## 2019-03-27 DIAGNOSIS — E162 Hypoglycemia, unspecified: Secondary | ICD-10-CM

## 2019-03-27 DIAGNOSIS — G479 Sleep disorder, unspecified: Secondary | ICD-10-CM

## 2019-03-27 DIAGNOSIS — R5383 Other fatigue: Secondary | ICD-10-CM

## 2019-03-27 LAB — BAYER DCA HB A1C WAIVED: HB A1C (BAYER DCA - WAIVED): 5.1 % (ref <7.0)

## 2019-03-27 MED ORDER — FLUOXETINE HCL 20 MG PO TABS: 20.0000 mg | ORAL_TABLET | Freq: Every day | ORAL | 1 refills | Status: DC

## 2019-03-27 NOTE — Progress Notes (Signed)
Subjective: CC: hypoglycemia PCP: [name], DO HWK:GSUP Shelby Rubio is a 18 y.o. female presenting to clinic today for:  1. Hypoglycemia Patient notes that over the last 2 months she has been having intermittent episodes of lightheadedness, nausea and pallor.  She notes becoming tremulous during the episodes and had been monitoring her blood sugar as result.  The lowest that she seen is been in the 61s.  The highest in the 120s.  Typically she runs 70s to 80s fasting.  She does report erratic sleeping, often not getting to bed until 2:00 in the morning and sleeping till noon or later.  She never goes longer than 14 hours without a meal and this includes overnight.  Often she does not skip meals for longer than 4 hours.  2.  Depressive disorder/sleep disturbance She does report that her depression seems to impact her ability to go to sleep on time and in fact causes her to sleep longer than normal.  She was previously treated with a medication but she does not recall the name of the medicine.  She does want to go back on an antidepressant as she does feel the need for it.   ROS: Per HPI  Allergies  Allergen Reactions  . Amoxil [Amoxicillin] Hives  . Penicillins Hives   Past Medical History:  Diagnosis Date  . Behavior disorder   . Depression     Current Outpatient Medications:  .  benzonatate (TESSALON PERLES) 100 MG capsule, Take 1 capsule (100 mg total) by mouth 3 (three) times daily as needed., Disp: 20 capsule, Rfl: 0 .  blood glucose meter kit and supplies, Dispense based on patient and insurance preference. Use up to four times daily as directed. (FOR ICD-10 E10.9, E11.9)., Disp: 1 each, Rfl: 0 .  cetirizine (ZYRTEC) 10 MG tablet, Take 1 tablet (10 mg total) by mouth daily., Disp: 30 tablet, Rfl: 11 .  fluticasone (FLONASE) 50 MCG/ACT nasal spray, Place 2 sprays into both nostrils daily., Disp: 16 g, Rfl: 6 .  Norgestimate-Ethinyl Estradiol Triphasic (ORTHO TRI-CYCLEN,  28,) 0.18/0.215/0.25 MG-35 MCG tablet, Take 1 tablet by mouth daily., Disp: 3 Package, Rfl: 3 Social History   Socioeconomic History  . Marital status: Single    Spouse name: Not on file  . Number of children: Not on file  . Years of education: Not on file  . Highest education level: Not on file  Occupational History  . Not on file  Social Needs  . Financial resource strain: Not on file  . Food insecurity    Worry: Not on file    Inability: Not on file  . Transportation needs    Medical: Not on file    Non-medical: Not on file  Tobacco Use  . Smoking status: Current Every Day Smoker    Packs/day: 0.50    Years: 0.50    Pack years: 0.25    Types: Cigarettes  . Smokeless tobacco: Never Used  Substance and Sexual Activity  . Alcohol use: No  . Drug use: No  . Sexual activity: Not on file  Lifestyle  . Physical activity    Days per week: Not on file    Minutes per session: Not on file  . Stress: Not on file  Relationships  . Social Herbalist on phone: Not on file    Gets together: Not on file    Attends religious service: Not on file    Active member of club or organization:  Not on file    Attends meetings of clubs or organizations: Not on file    Relationship status: Not on file  . Intimate partner violence    Fear of current or ex partner: Not on file    Emotionally abused: Not on file    Physically abused: Not on file    Forced sexual activity: Not on file  Other Topics Concern  . Not on file  Social History Narrative  . Not on file   Family History  Problem Relation Age of Onset  . Diabetes Other   . Asthma Sister   . Asthma Brother   . Hypertension Mother     Objective: Office vital signs reviewed. BP 134/75   Pulse (!) 103   Temp 99.4 F (37.4 C) (Temporal)   Ht 5' 4"  (1.626 m)   Wt 211 lb (95.7 kg)   BMI 36.22 kg/m   Physical Examination:  General: Awake, alert, obese, No acute distress HEENT: Normal    Neck: No masses palpated.  No lymphadenopathy; no goiter Cardio: regular rate and rhythm, S1S2 heard, no murmurs appreciated Pulm: clear to auscultation bilaterally, no wheezes, rhonchi or rales; normal work of breathing on room air Extremities: warm, well perfused, No edema, cyanosis or clubbing; +2 pulses bilaterally Skin: dry; intact; no rashes or lesions; normal temperature Neuro: no tremor Psych: Mood stable, speech normal, eye contact fair Depression screen [facility] 2/9 03/27/2019 09/10/2018 06/06/2018  Decreased Interest 0 1 1  Down, Depressed, Hopeless 0 3 3  PHQ - 2 Score 0 4 4  Altered sleeping 0 3 3  Tired, decreased energy 0 3 3  Change in appetite 0 2 3  Feeling bad or failure about yourself  0 1 3  Trouble concentrating 0 1 2  Moving slowly or fidgety/restless 0 2 2  Suicidal thoughts 0 1 0  PHQ-9 Score 0 17 20  Difficult doing work/chores - - Somewhat difficult   GAD 7 : Generalized Anxiety Score 03/27/2019  Nervous, Anxious, on Edge 0  Control/stop worrying 0  Worry too much - different things 0  Trouble relaxing 0  Restless 0  Easily annoyed or irritable 1  Afraid - awful might happen 0  Total GAD 7 Score 1      Assessment/ Plan: 18 y.o. female   1. Hypoglycemia No significant hypoglycemic episodes per her report.  We will go ahead and check a cortisol level for completion.  Her A1c was 5.1 today.  No evidence of diabetes.  I did advise her to try and maintain a better sleep cycle/hygiene.  Would try and schedule meals that are wholesome with complex carbohydrates. - Bayer DCA Hb A1c Waived - Cortisol; Future  2. Sleep difficulties Advised to start melatonin and set a set sleep time and wake time.  I do think that this would likely improve overall fatigue symptoms.  Reinforced physical activity  3. Moderate episode of recurrent major depressive disorder (HCC) Her PHQ 9 score is not significant.  However, I do question if she is stoic and not answering questions truthfully.  We will trial  Prozac 20 mg daily and have her recheck in 6 to 8 weeks.  The national suicide hotline number, [place] crisis hotline and [facility] crisis hotline numbers were provided to her today - FLUoxetine (PROZAC) 20 MG tablet; Take 1 tablet (20 mg total) by mouth daily.  Dispense: 30 tablet; Refill: 1  4. Fatigue, unspecified type Uncertain etiology.  Likely related to low  physical activity, irregular sleep patterns and likely unhealthy eating habits.  She will come in for early a.m. labs.  Check TSH and CBC for completion - TSH; Future - CBC; Future - CMP14+EGFR; Future   Orders Placed This Encounter  Procedures  . Bayer DCA Hb A1c Waived   No orders of the defined types were placed in this encounter.    [name], DO [place] [phone]

## 2019-03-27 NOTE — Patient Instructions (Signed)
I have ordered labs to have done.  You have to come in at 8 AM to have these done to be valid.  I have sent in Fluoxetine to help with depression, and hopefully sleep.  Ok to start melatonin 3mg  daily.    Taking the medicine as directed and not missing any doses is one of the best things you can do to treat your depression.  Here are some things to keep in mind:  1) Side effects (stomach upset, some increased anxiety) may happen before you notice a benefit.  These side effects typically go away over time. 2) Changes to your dose of medicine or a change in medication all together is sometimes necessary 3) Most people need to be on medication at least 12 months 4) Many people will notice an improvement within two weeks but the full effect of the medication can take up to 4-6 weeks 5) Stopping the medication when you start feeling better often results in a return of symptoms 6) Never discontinue your medication without contacting a health care professional first.  Some medications require gradual discontinuation/ taper and can make you sick if you stop them abruptly.  If your symptoms worsen or you have thoughts of suicide/homicide, PLEASE SEEK IMMEDIATE MEDICAL ATTENTION.  You may always call:  National Suicide Hotline: 206 755 2115 [place]: 605-484-5538 Crisis Recovery in [place]: (919) 660-9797   These are available 24 hours a day, 7 days a week.

## 2019-05-25 ENCOUNTER — Other Ambulatory Visit: Payer: Self-pay | Admitting: Family Medicine

## 2019-05-25 DIAGNOSIS — F331 Major depressive disorder, recurrent, moderate: Secondary | ICD-10-CM

## 2019-05-28 ENCOUNTER — Ambulatory Visit (INDEPENDENT_AMBULATORY_CARE_PROVIDER_SITE_OTHER): Payer: Managed Care, Other (non HMO) | Admitting: Family Medicine

## 2019-05-28 ENCOUNTER — Encounter: Payer: Self-pay | Admitting: Family Medicine

## 2019-05-28 DIAGNOSIS — J019 Acute sinusitis, unspecified: Secondary | ICD-10-CM

## 2019-05-28 DIAGNOSIS — R3989 Other symptoms and signs involving the genitourinary system: Secondary | ICD-10-CM

## 2019-05-28 MED ORDER — LEVOFLOXACIN 750 MG PO TABS: 750.0000 mg | ORAL_TABLET | Freq: Every day | ORAL | 0 refills | Status: AC

## 2019-05-28 NOTE — Patient Instructions (Addendum)
Sinusitis, Adult Sinusitis is soreness and swelling (inflammation) of your sinuses. Sinuses are hollow spaces in the bones around your face. They are located:  Around your eyes.  In the middle of your forehead.  Behind your nose.  In your cheekbones. Your sinuses and nasal passages are lined with a fluid called mucus. Mucus drains out of your sinuses. Swelling can trap mucus in your sinuses. This lets germs (bacteria, virus, or fungus) grow, which leads to infection. Most of the time, this condition is caused by a virus. What are the causes? This condition is caused by:  Allergies.  Asthma.  Germs.  Things that block your nose or sinuses.  Growths in the nose (nasal polyps).  Chemicals or irritants in the air.  Fungus (rare). What increases the risk? You are more likely to develop this condition if:  You have a weak body defense system (immune system).  You do a lot of swimming or diving.  You use nasal sprays too much.  You smoke. What are the signs or symptoms? The main symptoms of this condition are pain and a feeling of pressure around the sinuses. Other symptoms include:  Stuffy nose (congestion).  Runny nose (drainage).  Swelling and warmth in the sinuses.  Headache.  Toothache.  A cough that may get worse at night.  Mucus that collects in the throat or the back of the nose (postnasal drip).  Being unable to smell and taste.  Being very tired (fatigue).  A fever.  Sore throat.  Bad breath. How is this diagnosed? This condition is diagnosed based on:  Your symptoms.  Your medical history.  A physical exam.  Tests to find out if your condition is short-term (acute) or long-term (chronic). Your doctor may: ? Check your nose for growths (polyps). ? Check your sinuses using a tool that has a light (endoscope). ? Check for allergies or germs. ? Do imaging tests, such as an MRI or CT scan. How is this treated? Treatment for this condition  depends on the cause and whether it is short-term or long-term.  If caused by a virus, your symptoms should go away on their own within 10 days. You may be given medicines to relieve symptoms. They include: ? Medicines that shrink swollen tissue in the nose. ? Medicines that treat allergies (antihistamines). ? A spray that treats swelling of the nostrils. ? Rinses that help get rid of thick mucus in your nose (nasal saline washes).  If caused by bacteria, your doctor may wait to see if you will get better without treatment. You may be given antibiotic medicine if you have: ? A very bad infection. ? A weak body defense system.  If caused by growths in the nose, you may need to have surgery. Follow these instructions at home: Medicines  Take, use, or apply over-the-counter and prescription medicines only as told by your doctor. These may include nasal sprays.  If you were prescribed an antibiotic medicine, take it as told by your doctor. Do not stop taking the antibiotic even if you start to feel better. Hydrate and humidify   Drink enough water to keep your pee (urine) pale yellow.  Use a cool mist humidifier to keep the humidity level in your home above 50%.  Breathe in steam for 10-15 minutes, 3-4 times a day, or as told by your doctor. You can do this in the bathroom while a hot shower is running.  Try not to spend time in cool or dry air.   Rest  Rest as much as you can.  Sleep with your head raised (elevated).  Make sure you get enough sleep each night. General instructions   Put a warm, moist washcloth on your face 3-4 times a day, or as often as told by your doctor. This will help with discomfort.  Wash your hands often with soap and water. If there is no soap and water, use hand sanitizer.  Do not smoke. Avoid being around people who are smoking (secondhand smoke).  Keep all follow-up visits as told by your doctor. This is important. Contact a doctor if:  You  have a fever.  Your symptoms get worse.  Your symptoms do not get better within 10 days. Get help right away if:  You have a very bad headache.  You cannot stop throwing up (vomiting).  You have very bad pain or swelling around your face or eyes.  You have trouble seeing.  You feel confused.  Your neck is stiff.  You have trouble breathing. Summary  Sinusitis is swelling of your sinuses. Sinuses are hollow spaces in the bones around your face.  This condition is caused by tissues in your nose that become inflamed or swollen. This traps germs. These can lead to infection.  If you were prescribed an antibiotic medicine, take it as told by your doctor. Do not stop taking it even if you start to feel better.  Keep all follow-up visits as told by your doctor. This is important. This information is not intended to replace advice given to you by your health care provider. Make sure you discuss any questions you have with your health care provider. Document Released: 01/04/2008 Document Revised: 12/18/2017 Document Reviewed: 12/18/2017 Elsevier Patient Education  2020 [employer]   Urinary Tract Infection, Adult A urinary tract infection (UTI) is an infection of any part of the urinary tract. The urinary tract includes:  The kidneys.  The ureters.  The bladder.  The urethra. These organs make, store, and get rid of pee (urine) in the body. What are the causes? This is caused by germs (bacteria) in your genital area. These germs grow and cause swelling (inflammation) of your urinary tract. What increases the risk? You are more likely to develop this condition if:  You have a small, thin tube (catheter) to drain pee.  You cannot control when you pee or poop (incontinence).  You are female, and: ? You use these methods to prevent pregnancy: ? A medicine that kills sperm (spermicide). ? A device that blocks sperm (diaphragm). ? You have low levels of a female hormone  (estrogen). ? You are pregnant.  You have genes that add to your risk.  You are sexually active.  You take antibiotic medicines.  You have trouble peeing because of: ? A prostate that is bigger than normal, if you are female. ? A blockage in the part of your body that drains pee from the bladder (urethra). ? A kidney stone. ? A nerve condition that affects your bladder (neurogenic bladder). ? Not getting enough to drink. ? Not peeing often enough.  You have other conditions, such as: ? Diabetes. ? A weak disease-fighting system (immune system). ? Sickle cell disease. ? Gout. ? Injury of the spine. What are the signs or symptoms? Symptoms of this condition include:  Needing to pee right away (urgently).  Peeing often.  Peeing small amounts often.  Pain or burning when peeing.  Blood in the pee.  Pee that smells bad or  not like normal.  Trouble peeing.  Pee that is cloudy.  Fluid coming from the vagina, if you are female.  Pain in the belly or lower back. Other symptoms include:  Throwing up (vomiting).  No urge to eat.  Feeling mixed up (confused).  Being tired and grouchy (irritable).  A fever.  Watery poop (diarrhea). How is this treated? This condition may be treated with:  Antibiotic medicine.  Other medicines.  Drinking enough water. Follow these instructions at home:  Medicines  Take over-the-counter and prescription medicines only as told by your doctor.  If you were prescribed an antibiotic medicine, take it as told by your doctor. Do not stop taking it even if you start to feel better. General instructions  Make sure you: ? Pee until your bladder is empty. ? Do not hold pee for a long time. ? Empty your bladder after sex. ? Wipe from front to back after pooping if you are a female. Use each tissue one time when you wipe.  Drink enough fluid to keep your pee pale yellow.  Keep all follow-up visits as told by your doctor. This is  important. Contact a doctor if:  You do not get better after 1-2 days.  Your symptoms go away and then come back. Get help right away if:  You have very bad back pain.  You have very bad pain in your lower belly.  You have a fever.  You are sick to your stomach (nauseous).  You are throwing up. Summary  A urinary tract infection (UTI) is an infection of any part of the urinary tract.  This condition is caused by germs in your genital area.  There are many risk factors for a UTI. These include having a small, thin tube to drain pee and not being able to control when you pee or poop.  Treatment includes antibiotic medicines for germs.  Drink enough fluid to keep your pee pale yellow. This information is not intended to replace advice given to you by your health care provider. Make sure you discuss any questions you have with your health care provider. Document Released: 01/04/2008 Document Revised: 07/05/2018 Document Reviewed: 01/25/2018 Elsevier Patient Education  2020 [employer].

## 2019-05-28 NOTE — Progress Notes (Signed)
Virtual Visit via Telephone Note  I connected with Shelby Rubio on 05/28/19 at 4:59 PM by telephone and verified that I am speaking with the correct person using two identifiers. Shelby Rubio is currently located at home and nobody is currently with her during this visit. The provider, Loman Brooklyn, FNP is located in their office at time of visit.  I discussed the limitations, risks, security and privacy concerns of performing an evaluation and management service by telephone and the availability of in person appointments. I also discussed with the patient that there may be a patient responsible charge related to this service. The patient expressed understanding and agreed to proceed.  Subjective: PCP: [name], DO  Chief Complaint  Patient presents with  . Flank Pain  . Headache  . Generalized Body Aches  . Chest Pain   Patient complains of flank and chest pain. Additional symptoms include cough, chest congestion, headache, runny nose, sneezing, sore throat, facial pain/pressure, nausea, urinary frequency, cloudy urine, and body aches. Onset of symptoms was 3 days ago, gradually worsening since that time. She denies hematuria, abnormal odor, and fevers. She is drinking plenty of fluids. Evaluation to date: none. Treatment to date: Azo and Naproxen. She does smoke. Patient has not had known recent close contact with someone who has tested positive for COVID-19. Patient works at [employer].     ROS: Per HPI  Current Outpatient Medications:  .  blood glucose meter kit and supplies, Dispense based on patient and insurance preference. Use up to four times daily as directed. (FOR ICD-10 E10.9, E11.9)., Disp: 1 each, Rfl: 0 .  cetirizine (ZYRTEC) 10 MG tablet, Take 1 tablet (10 mg total) by mouth daily., Disp: 30 tablet, Rfl: 11 .  FLUoxetine (PROZAC) 20 MG tablet, Take 1 tablet (20 mg total) by mouth daily., Disp: 30 tablet, Rfl: 1 .  levofloxacin (LEVAQUIN) 750 MG  tablet, Take 1 tablet (750 mg total) by mouth daily for 7 days., Disp: 7 tablet, Rfl: 0 .  Norgestimate-Ethinyl Estradiol Triphasic (ORTHO TRI-CYCLEN, 28,) 0.18/0.215/0.25 MG-35 MCG tablet, Take 1 tablet by mouth daily., Disp: 3 Package, Rfl: 3  Allergies  Allergen Reactions  . Amoxil [Amoxicillin] Hives  . Penicillins Hives   Past Medical History:  Diagnosis Date  . Behavior disorder   . Depression     Observations/Objective: A&O  No respiratory distress or wheezing audible over the phone Mood, judgement, and thought processes all WNL  Assessment and Plan: 1. Acute non-recurrent sinusitis, unspecified location - Education provided on sinusitis and symptom management. Encouraged patient to go get tested for COVID-19. Note provided for work.  - levofloxacin (LEVAQUIN) 750 MG tablet; Take 1 tablet (750 mg total) by mouth daily for 7 days.  Dispense: 7 tablet; Refill: 0  2. Suspected UTI - levofloxacin (LEVAQUIN) 750 MG tablet; Take 1 tablet (750 mg total) by mouth daily for 7 days.  Dispense: 7 tablet; Refill: 0   Follow Up Instructions:  I discussed the assessment and treatment plan with the patient. The patient was provided an opportunity to ask questions and all were answered. The patient agreed with the plan and demonstrated an understanding of the instructions.   The patient was advised to call back or seek an in-person evaluation if the symptoms worsen or if the condition fails to improve as anticipated.  The above assessment and management plan was discussed with the patient. The patient verbalized understanding of and has agreed to the management plan.  Patient is aware to call the clinic if symptoms persist or worsen. Patient is aware when to return to the clinic for a follow-up visit. Patient educated on when it is appropriate to go to the emergency department.   Time call ended: 5:15 PM  I provided 17 minutes of non-face-to-face time during this encounter.  Shelby Limes, MSN, APRN, FNP-C [facility] 05/28/19

## 2019-09-03 IMAGING — US US PELVIS COMPLETE TRANSABD/TRANSVAG
1 series · 13 of 25 positions shown · non-contrast
Comparison: None

CLINICAL DATA: Initial evaluation for intermittent lower abdominal
pain for several months.



[Series 1: us pelvis complete transabd/transvag · 13 of 66 slices shown]
[im 1/66]
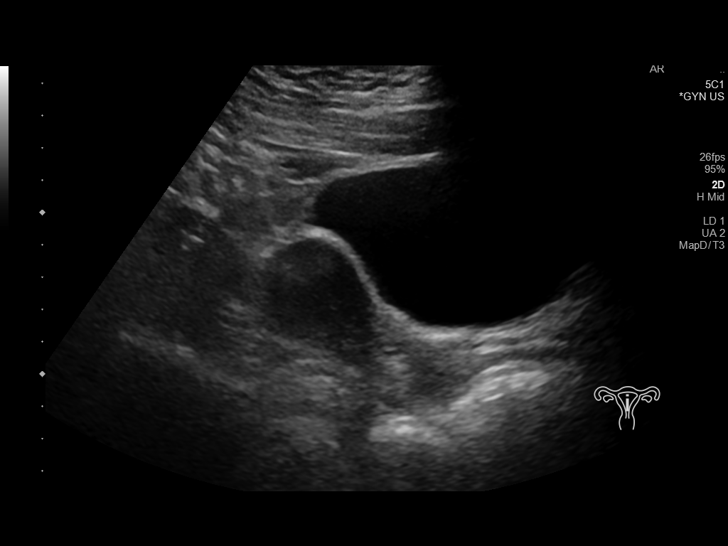
[im 6/66]
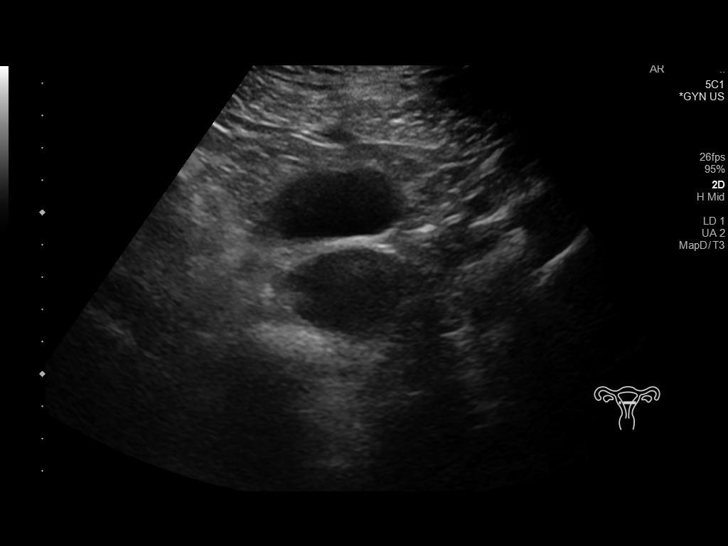
[im 11/66]
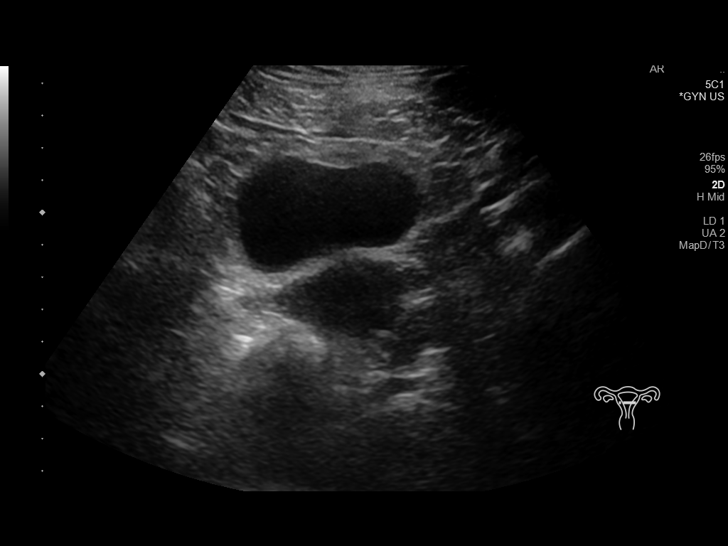
[im 17/66]
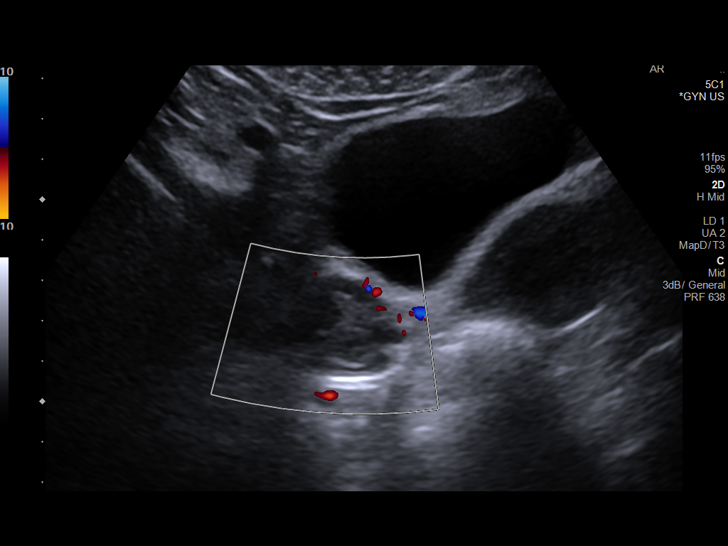
[im 22/66]
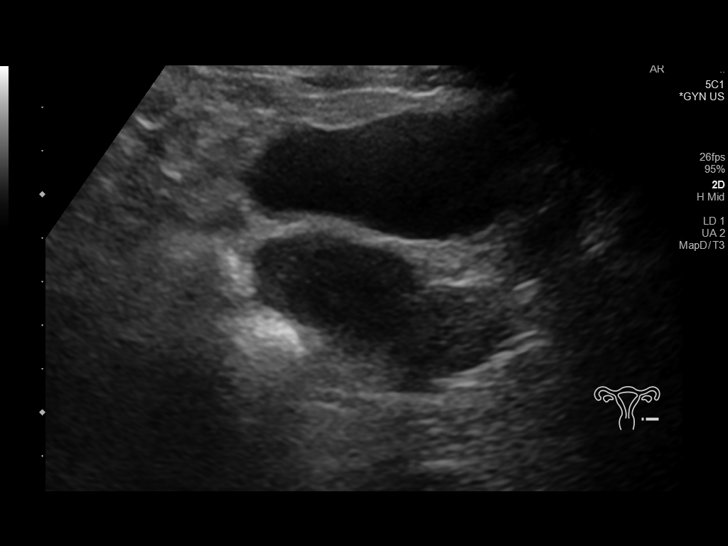
[im 28/66]
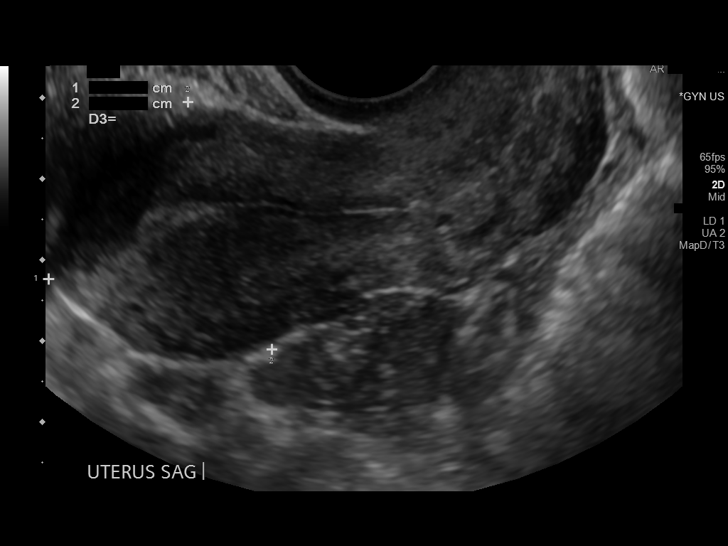
[im 33/66]
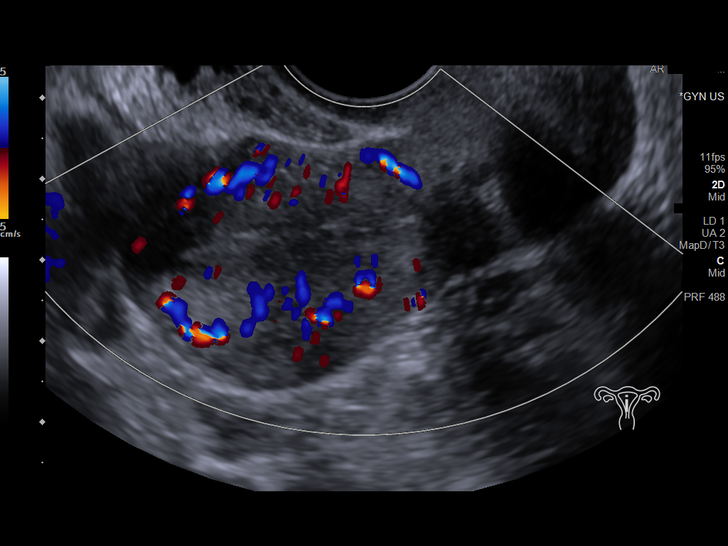
[im 38/66]
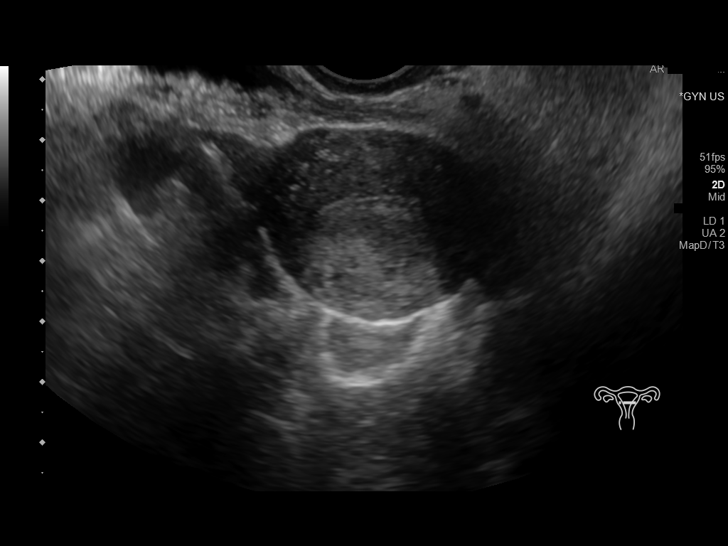
[im 44/66]
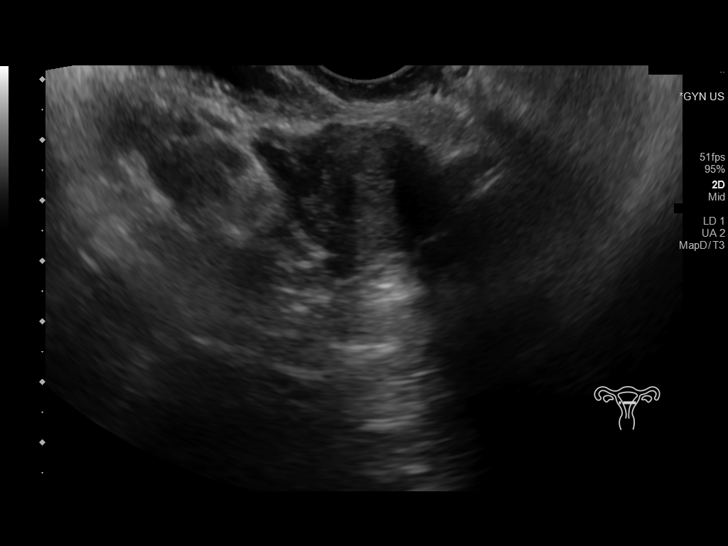
[im 49/66]
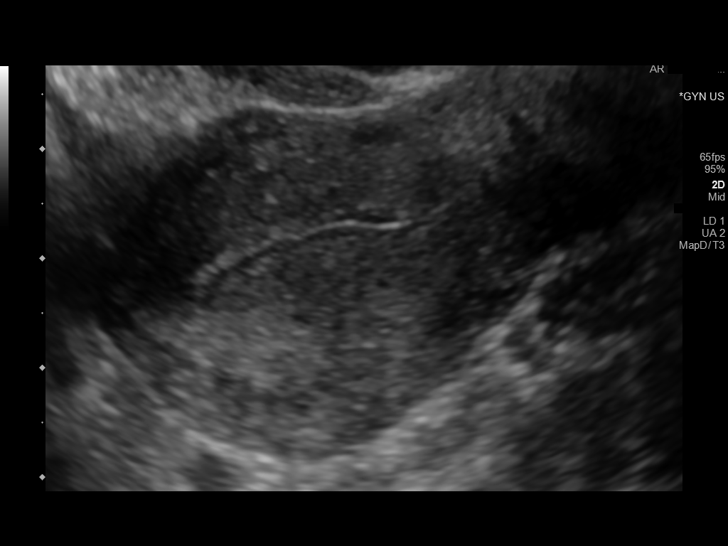
[im 55/66]
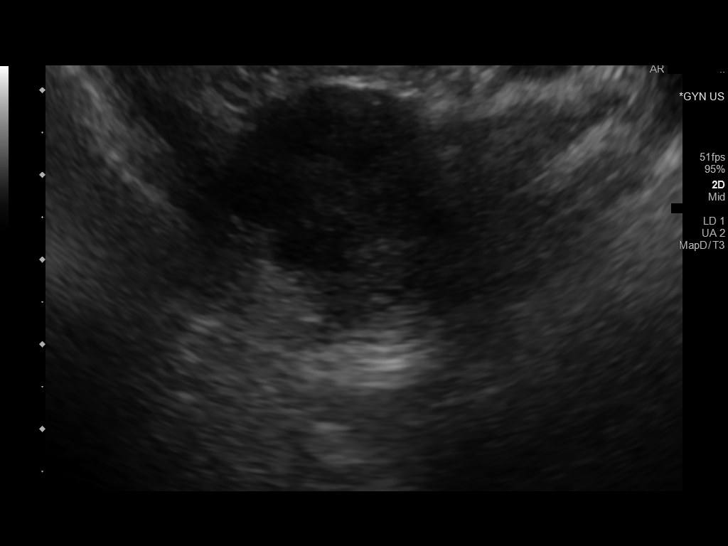
[im 60/66]
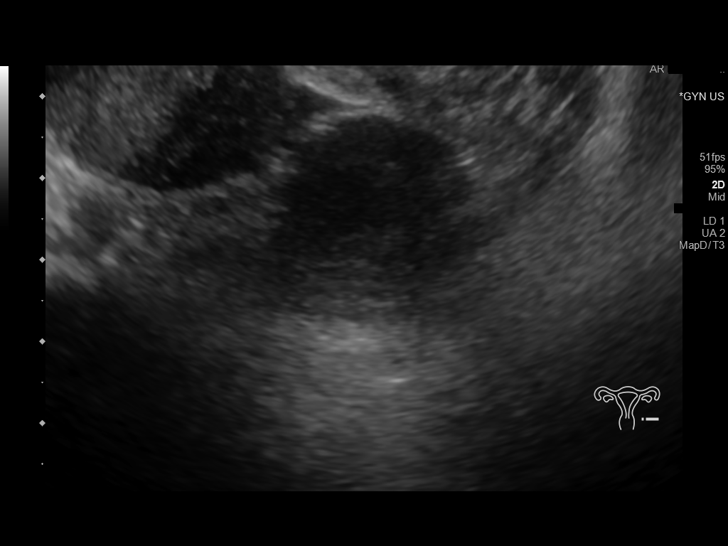
[im 66/66]
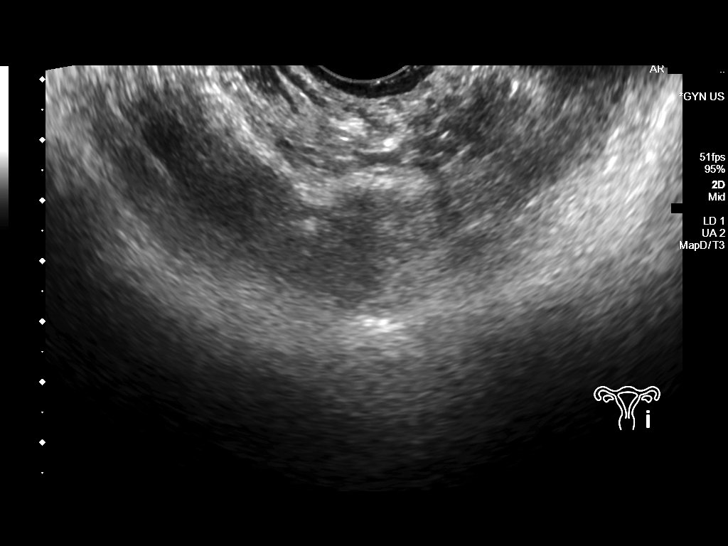

[13 of 25 positions shown; findings below may reference images not displayed]

FINDINGS: Uterus

Measurements: 7.3 x 3.2 x 3.6 cm = volume: 44.1 mL. No fibroids or
other mass visualized.

Endometrium

Thickness: 4 mm.  No focal abnormality visualized.

Right ovary

Measurements: 2.9 x 2.3 x 2.4 cm = volume: 8.4 mL. Normal
appearance/no adnexal mass. No findings to suggest PCOS identified.

Left ovary

Measurements: 3.1 x 2.6 x 2.6 cm = volume: 11.1 mL. Normal
appearance/no adnexal mass. No findings to suggest PCOS identified.

Other findings

No abnormal free fluid.
IMPRESSION: 1. Normal pelvic ultrasound.  No acute abnormality identified.
2. No imaging findings to suggest PCOS identified.

## 2020-07-01 ENCOUNTER — Encounter: Payer: Self-pay | Admitting: Emergency Medicine

## 2020-07-01 ENCOUNTER — Other Ambulatory Visit: Payer: Self-pay

## 2020-07-01 ENCOUNTER — Ambulatory Visit
Admission: EM | Admit: 2020-07-01 | Discharge: 2020-07-01 | Disposition: A | Discharge status: Home / Self Care | Payer: Managed Care, Other (non HMO) | Attending: Emergency Medicine | Admitting: Emergency Medicine

## 2020-07-01 DIAGNOSIS — J019 Acute sinusitis, unspecified: Secondary | ICD-10-CM

## 2020-07-01 DIAGNOSIS — R059 Cough, unspecified: Secondary | ICD-10-CM

## 2020-07-01 MED ORDER — DOXYCYCLINE HYCLATE 100 MG PO CAPS: 100.0000 mg | ORAL_CAPSULE | Freq: Two times a day (BID) | ORAL | 0 refills | Status: DC

## 2020-07-01 NOTE — Discharge Instructions (Signed)
COVID testing ordered.  It will take between 5-7 days for test results.  Someone will contact you regarding abnormal results.    In the meantime: You should remain isolated in your home for 10 days from symptom onset AND greater than 72 hours after symptoms resolution (absence of fever without the use of fever-reducing medication and improvement in respiratory symptoms), whichever is longer Get plenty of rest and push fluids Will cover for probably sinus infection Use OTC zyrtec for nasal congestion, runny nose, and/or sore throat Use OTC flonase for nasal congestion and runny nose Use medications daily for symptom relief Use OTC medications like ibuprofen or tylenol as needed fever or pain Call or go to the ED if you have any new or worsening symptoms such as fever, cough, shortness of breath, chest tightness, chest pain, turning blue, changes in mental status, etc..Marland Kitchen

## 2020-07-01 NOTE — ED Triage Notes (Signed)
Sinus congestion since Friday  and scratchy throat that started yesterday morning

## 2020-07-01 NOTE — ED Provider Notes (Signed)
[facility]   [id number] 07/01/20 Arrival Time: 4944   CC: COVID symptoms  SUBJECTIVE: History from: patient.  Shelby Rubio is a 19 y.o. female who presents with sinus congestion x 5 days and scratchy throat x 1 day.  Little brother with similar symptoms, did not test positive for covid.  Has tried OTC medications without relief.  Denies aggravating factors.  Denies previous covid infection in the past.   Denies fever, chills, fatigue, rhinorrhea, cough, SOB, wheezing, chest pain, nausea, changes in bowel or bladder habits.     ROS: As per HPI.  All other pertinent ROS negative.     Past Medical History:  Diagnosis Date  . Behavior disorder   . Depression    Past Surgical History:  Procedure Laterality Date  . ADENOIDECTOMY    . TONSILLECTOMY    . tubes in ears     Allergies  Allergen Reactions  . Amoxil [Amoxicillin] Hives  . Penicillins Hives   No current facility-administered medications on file prior to encounter.   Current Outpatient Medications on File Prior to Encounter  Medication Sig Dispense Refill  . blood glucose meter kit and supplies Dispense based on patient and insurance preference. Use up to four times daily as directed. (FOR ICD-10 E10.9, E11.9). 1 each 0  . cetirizine (ZYRTEC) 10 MG tablet Take 1 tablet (10 mg total) by mouth daily. 30 tablet 11  . FLUoxetine (PROZAC) 20 MG tablet Take 1 tablet (20 mg total) by mouth daily. 30 tablet 1  . Norgestimate-Ethinyl Estradiol Triphasic (ORTHO TRI-CYCLEN, 28,) 0.18/0.215/0.25 MG-35 MCG tablet Take 1 tablet by mouth daily. 3 Package 3   Social History   Socioeconomic History  . Marital status: Single    Spouse name: Not on file  . Number of children: Not on file  . Years of education: Not on file  . Highest education level: Not on file  Occupational History  . Not on file  Tobacco Use  . Smoking status: Current Every Day Smoker    Packs/day: 0.50    Years: 0.50    Pack years: 0.25    Types:  Cigarettes  . Smokeless tobacco: Never Used  Vaping Use  . Vaping Use: Never used  Substance and Sexual Activity  . Alcohol use: No  . Drug use: No  . Sexual activity: Not on file  Other Topics Concern  . Not on file  Social History Narrative  . Not on file   Social Determinants of Health   Financial Resource Strain:   . Difficulty of Paying Living Expenses: Not on file  Food Insecurity:   . Worried About Charity fundraiser in the Last Year: Not on file  . Ran Out of Food in the Last Year: Not on file  Transportation Needs:   . Lack of Transportation (Medical): Not on file  . Lack of Transportation (Non-Medical): Not on file  Physical Activity:   . Days of Exercise per Week: Not on file  . Minutes of Exercise per Session: Not on file  Stress:   . Feeling of Stress : Not on file  Social Connections:   . Frequency of Communication with Friends and Family: Not on file  . Frequency of Social Gatherings with Friends and Family: Not on file  . Attends Religious Services: Not on file  . Active Member of Clubs or Organizations: Not on file  . Attends Archivist Meetings: Not on file  . Marital Status: Not on file  Intimate Partner Violence:   . Fear of Current or Ex-Partner: Not on file  . Emotionally Abused: Not on file  . Physically Abused: Not on file  . Sexually Abused: Not on file   Family History  Problem Relation Age of Onset  . Diabetes Other   . Asthma Sister   . Asthma Brother   . Hypertension Mother     OBJECTIVE:  Vitals:   07/01/20 1438 07/01/20 1439  BP:  115/78  Pulse:  (!) 104  Resp:  19  Temp:  98 F (36.7 C)  TempSrc:  Oral  SpO2:  97%  Weight: 218 lb (98.9 kg)   Height: 5' 4"  (1.626 m)      General appearance: alert; appears fatigued, but nontoxic; speaking in full sentences and tolerating own secretions HEENT: NCAT; Ears: EACs clear, TMs pearly gray; Eyes: PERRL.  EOM grossly intact. Sinuses: TTP; Nose: nares patent without  rhinorrhea, Throat: oropharynx clear, tonsils non erythematous or enlarged, uvula midline  Neck: supple without LAD Lungs: unlabored respirations, symmetrical air entry; cough: absent; no respiratory distress; CTAB Heart: regular rate and rhythm.   Skin: warm and dry Psychological: alert and cooperative; normal mood and affect  ASSESSMENT & PLAN:  1. Cough   2. Acute non-recurrent sinusitis, unspecified location     Meds ordered this encounter  Medications  . doxycycline (VIBRAMYCIN) 100 MG capsule    Sig: Take 1 capsule (100 mg total) by mouth 2 (two) times daily.    Dispense:  20 capsule    Refill:  0    Order Specific Question:   Supervising Provider    Answer:   Raylene Everts [[id number]]   COVID testing ordered.  It will take between 5-7 days for test results.  Someone will contact you regarding abnormal results.    In the meantime: You should remain isolated in your home for 10 days from symptom onset AND greater than 72 hours after symptoms resolution (absence of fever without the use of fever-reducing medication and improvement in respiratory symptoms), whichever is longer Get plenty of rest and push fluids Will cover for probable sinus infection Use OTC zyrtec for nasal congestion, runny nose, and/or sore throat Use OTC flonase for nasal congestion and runny nose Use medications daily for symptom relief Use OTC medications like ibuprofen or tylenol as needed fever or pain Call or go to the ED if you have any new or worsening symptoms such as fever, cough, shortness of breath, chest tightness, chest pain, turning blue, changes in mental status, etc...   Reviewed expectations re: course of current medical issues. Questions answered. Outlined signs and symptoms indicating need for more acute intervention. Patient verbalized understanding. After Visit Summary given.         Lestine Box, PA-C 07/01/20 1456

## 2020-07-03 LAB — SARS-COV-2, NAA 2 DAY TAT

## 2020-07-03 LAB — NOVEL CORONAVIRUS, NAA: SARS-CoV-2, NAA: NOT DETECTED

## 2020-10-29 ENCOUNTER — Other Ambulatory Visit: Payer: Self-pay

## 2020-10-29 ENCOUNTER — Emergency Department (HOSPITAL_COMMUNITY)
Admission: EM | Admit: 2020-10-29 | Discharge: 2020-10-29 | Disposition: A | Discharge status: Home / Self Care | Payer: BC Managed Care – PPO | Attending: Emergency Medicine | Admitting: Emergency Medicine

## 2020-10-29 ENCOUNTER — Encounter (HOSPITAL_COMMUNITY): Payer: Self-pay | Admitting: Emergency Medicine

## 2020-10-29 DIAGNOSIS — R0981 Nasal congestion: Secondary | ICD-10-CM | POA: Insufficient documentation

## 2020-10-29 DIAGNOSIS — F17210 Nicotine dependence, cigarettes, uncomplicated: Secondary | ICD-10-CM | POA: Insufficient documentation

## 2020-10-29 DIAGNOSIS — R067 Sneezing: Secondary | ICD-10-CM | POA: Diagnosis not present

## 2020-10-29 DIAGNOSIS — Z20822 Contact with and (suspected) exposure to covid-19: Secondary | ICD-10-CM | POA: Diagnosis not present

## 2020-10-29 MED ORDER — LORATADINE 10 MG PO TABS: 10.0000 mg | ORAL_TABLET | Freq: Every day | ORAL | 0 refills | Status: DC

## 2020-10-29 MED ORDER — FLUTICASONE PROPIONATE 50 MCG/ACT NA SUSP: 2.0000 | Freq: Every day | NASAL | 0 refills | Status: DC

## 2020-10-29 NOTE — Discharge Instructions (Signed)
You were given a prescription for antibiotics. If your symptoms continue past 7-10 days then you should take the antibiotic.    You were also given a prescription for nasal spray and allergy medication. Take these medications as directed.   Your covid test is pending. You can follow up on the result tomorrow in [place].  Please follow up with your primary care provider within 5-7 days for re-evaluation of your symptoms. If you do not have a primary care provider, information for a healthcare clinic has been provided for you to make arrangements for follow up care. Please return to the emergency department for any new or worsening symptoms.

## 2020-10-29 NOTE — ED Provider Notes (Signed)
[facility] EMERGENCY DEPARTMENT Provider Note   CSN: [id number] Arrival date & time: 10/29/20  1436     History Chief Complaint  Patient presents with  . Nasal Congestion    Shelby Rubio is a 20 y.o. female.  HPI   20 year old female with a history of behavior disorder, depression, who presents to the emergency department today for evaluation of nasal congestion.  She reports nasal congestion postnasal drip and cough for the last 6 days.  She denies any fevers or sore throat.  She has been sneezing as well.  She has a history of seasonal allergies but is no longer taking anything for them right now.  She has not had her Covid vaccine.  She denies any sick contacts.   Past Medical History:  Diagnosis Date  . Behavior disorder   . Depression     Patient Active Problem List   Diagnosis Date Noted  . History of hypoglycemia 03/01/2019  . Dizziness 03/01/2019  . Refused influenza vaccine 06/06/2018  . Abnormal menses 06/06/2018  . Lower abdominal pain 06/06/2018  . Moderate episode of recurrent major depressive disorder ([place]) 06/10/2016    Past Surgical History:  Procedure Laterality Date  . ADENOIDECTOMY    . TONSILLECTOMY    . tubes in ears       OB History    Gravida      Para      Term      Preterm      AB      Living  0     SAB      IAB      Ectopic      Multiple      Live Births              Family History  Problem Relation Age of Onset  . Diabetes Other   . Asthma Sister   . Asthma Brother   . Hypertension Mother     Social History   Tobacco Use  . Smoking status: Current Every Day Smoker    Packs/day: 0.50    Years: 0.50    Pack years: 0.25    Types: Cigarettes  . Smokeless tobacco: Never Used  Vaping Use  . Vaping Use: Never used  Substance Use Topics  . Alcohol use: No  . Drug use: No    Home Medications Prior to Admission medications   Medication Sig Start Date End Date Taking? Authorizing Provider  fluticasone  (FLONASE) 50 MCG/ACT nasal spray Place 2 sprays into both nostrils daily. 10/29/20  Yes Tabari Volkert S, PA-C  loratadine (CLARITIN) 10 MG tablet Take 1 tablet (10 mg total) by mouth daily. 10/29/20 11/28/20 Yes Trever Streater S, PA-C  blood glucose meter kit and supplies Dispense based on patient and insurance preference. Use up to four times daily as directed. (FOR ICD-10 E10.9, E11.9). 03/01/19   Baruch Gouty, FNP  doxycycline (VIBRAMYCIN) 100 MG capsule Take 1 capsule (100 mg total) by mouth 2 (two) times daily. 07/01/20   Wurst, [place], PA-C  FLUoxetine (PROZAC) 20 MG tablet Take 1 tablet (20 mg total) by mouth daily. 03/27/19   [name], DO  Norgestimate-Ethinyl Estradiol Triphasic (ORTHO TRI-CYCLEN, 28,) 0.18/0.215/0.25 MG-35 MCG tablet Take 1 tablet by mouth daily. 09/10/18   [name], DO    Allergies    Amoxil [amoxicillin] and Penicillins  Review of Systems   Review of Systems  Constitutional: Negative for fever.  HENT: Positive for congestion  and postnasal drip.   Eyes: Negative for visual disturbance.  Respiratory: Positive for cough.   Cardiovascular: Negative for chest pain.  Gastrointestinal: Negative for abdominal pain.  Genitourinary: Negative for pelvic pain.  Musculoskeletal: Negative for back pain.    Physical Exam Updated Vital Signs BP 127/84 (BP Location: Right Arm)   Pulse 95   Temp 97.9 F (36.6 C) (Oral)   Resp 17   Ht 5' 4"  (1.626 m)   Wt 102.1 kg   LMP 10/25/2020   SpO2 99%   BMI 38.62 kg/m   Physical Exam Vitals and nursing note reviewed.  Constitutional:      General: She is not in acute distress.    Appearance: She is well-developed.  HENT:     Head: Normocephalic and atraumatic.     Nose: Congestion present.     Comments: Nasal turbinates swollen bilaterally Eyes:     Conjunctiva/sclera: Conjunctivae normal.  Cardiovascular:     Rate and Rhythm: Normal rate.  Pulmonary:     Effort: Pulmonary effort is normal.   Musculoskeletal:        General: Normal range of motion.     Cervical back: Neck supple.  Skin:    General: Skin is warm and dry.  Neurological:     Mental Status: She is alert.     ED Results / Procedures / Treatments   Labs (all labs ordered are listed, but only abnormal results are displayed) Labs Reviewed  SARS CORONAVIRUS 2 (TAT 6-24 HRS)    EKG None  Radiology No results found.  Procedures Procedures   Medications Ordered in ED Medications - No data to display  ED Course  I have reviewed the triage vital signs and the nursing notes.  Pertinent labs & imaging results that were available during my care of the patient were reviewed by me and considered in my medical decision making (see chart for details).    MDM Rules/Calculators/A&P                          20 year old female here with URI symptoms that started 6 days ago.  Lungs are clear to auscultation bilaterally.  She is afebrile here.  She is unvaccinated against Covid so we will test her for this.  She has had symptoms of sinusitis for about 6 days, I did give antibiotic and advised to wait and watch symptoms and if they continue for 7 to 10 days she should take the antibiotic.  She does have history of allergies as well as I gave prescription for fluticasone and Claritin to help with the symptoms.  Have advised close follow-up with PCP and strict return precautions.  She voices understanding of the plan and reasons to return.  All questions answered.  Patient stable for discharge.   Final Clinical Impression(s) / ED Diagnoses Final diagnoses:  Nasal congestion    Rx / DC Orders ED Discharge Orders         Ordered    loratadine (CLARITIN) 10 MG tablet  Daily        10/29/20 1550    fluticasone (FLONASE) 50 MCG/ACT nasal spray  Daily        10/29/20 [address] [place], PA-C 10/29/20 1551    Milton Ferguson, MD 10/29/20 2237

## 2020-10-29 NOTE — ED Triage Notes (Signed)
Pt c/o nasal congestion, cough x 6 days, reports she has taken OTC sinus meds with no relief

## 2020-10-30 LAB — SARS CORONAVIRUS 2 (TAT 6-24 HRS): SARS Coronavirus 2: NEGATIVE

## 2021-08-06 ENCOUNTER — Other Ambulatory Visit: Payer: Self-pay

## 2021-08-06 ENCOUNTER — Ambulatory Visit
Admission: EM | Admit: 2021-08-06 | Discharge: 2021-08-06 | Disposition: A | Discharge status: Home / Self Care | Payer: BC Managed Care – PPO

## 2021-08-06 DIAGNOSIS — E119 Type 2 diabetes mellitus without complications: Secondary | ICD-10-CM | POA: Diagnosis not present

## 2021-08-06 DIAGNOSIS — B3731 Acute candidiasis of vulva and vagina: Secondary | ICD-10-CM | POA: Diagnosis not present

## 2021-08-06 DIAGNOSIS — R000 Tachycardia, unspecified: Secondary | ICD-10-CM

## 2021-08-06 DIAGNOSIS — R052 Subacute cough: Secondary | ICD-10-CM | POA: Diagnosis not present

## 2021-08-06 DIAGNOSIS — R739 Hyperglycemia, unspecified: Secondary | ICD-10-CM

## 2021-08-06 DIAGNOSIS — E1165 Type 2 diabetes mellitus with hyperglycemia: Secondary | ICD-10-CM

## 2021-08-06 LAB — POCT URINALYSIS DIP (MANUAL ENTRY)
Bilirubin, UA: NEGATIVE
Glucose, UA: >=1000 mg/dL — AB
Leukocytes, UA: NEGATIVE
Nitrite, UA: NEGATIVE
Protein Ur, POC: 30 mg/dL — AB
Spec Grav, UA: 1.02 (ref 1.010–1.025)
Urobilinogen, UA: 0.2 E.U./dL
pH, UA: 5.5 (ref 5.0–8.0)

## 2021-08-06 LAB — POCT FASTING CBG KUC MANUAL ENTRY: POCT Glucose (KUC): 333 mg/dL — AB (ref 70–99)

## 2021-08-06 MED ORDER — FLUCONAZOLE 150 MG PO TABS: 150.0000 mg | ORAL_TABLET | ORAL | 0 refills | Status: DC

## 2021-08-06 MED ORDER — LISINOPRIL 5 MG PO TABS: 5.0000 mg | ORAL_TABLET | Freq: Every day | ORAL | 0 refills | Status: DC

## 2021-08-06 MED ORDER — METFORMIN HCL 1000 MG PO TABS: 1000.0000 mg | ORAL_TABLET | Freq: Two times a day (BID) | ORAL | 0 refills | Status: DC

## 2021-08-06 NOTE — Discharge Instructions (Addendum)
Metformin Dosing (to be taken with food) Week 1: take 1/2 tablet twice a day. Week 2: take 1 tablet in the morning, 1/2 tablet at night. Week 3: take 1 tablets twice a day.  For diabetes or elevated blood sugar, please make sure you are limiting and avoiding starchy, carbohydrate foods like pasta, breads, sweet breads, pastry, rice, potatoes, desserts. These foods can elevate your blood sugar. Also, limit and avoid drinks that contain a lot of sugar such as sodas, sweet teas, fruit juices.  Drinking plain water will be much more helpful, try 64 ounces of water daily.  It is okay to flavor your water naturally by cutting cucumber, lemon, mint or lime, placing it in a picture with water and drinking it over a period of 24-48 hours as long as it remains refrigerated.  For elevated blood pressure, make sure you are monitoring salt in your diet.  Do not eat restaurant foods and limit processed foods at home. I highly recommend you prepare and cook your own foods at home.  Processed foods include things like frozen meals, pre-seasoned meats and dinners, deli meats, canned foods as these foods contain a high amount of sodium/salt.  Make sure you are paying attention to sodium labels on foods you buy at the grocery store. Buy your spices separately such as garlic powder, onion powder, cumin, cayenne, parsley flakes so that you can avoid seasonings that contain salt. However, salt-free seasonings are available and can be used, an example is Mrs. Dash and includes a lot of different mixtures that do not contain salt.  Lastly, when cooking using oils that are healthier for you is important. This includes olive oil, avocado oil, canola oil. We have discussed a lot of foods to avoid but below is a list of foods that can be very healthy to use in your diet whether it is for diabetes, cholesterol, high blood pressure, or in general healthy eating.  Salads - kale, spinach, cabbage, spring mix, arugula Fruits -  avocadoes, berries (blueberries, raspberries, blackberries), apples, oranges, pomegranate, grapefruit, kiwi Vegetables - asparagus, cauliflower, broccoli, green beans, brussel sprouts, bell peppers, beets; stay away from or limit starchy vegetables like potatoes, carrots, peas Other general foods - kidney beans, egg whites, almonds, walnuts, sunflower seeds, pumpkin seeds, fat free yogurt, almond milk, flax seeds, quinoa, oats  Meat - It is better to eat lean meats and limit your red meat including pork to once a week.  Wild caught fish, chicken breast are good options as they tend to be leaner sources of good protein. Still be mindful of the sodium labels for the meats you buy.  DO NOT EAT ANY FOODS ON THIS LIST THAT YOU ARE ALLERGIC TO. For more specific needs, I highly recommend consulting a dietician or nutritionist but this can definitely be a good starting point.  

## 2021-08-06 NOTE — ED Provider Notes (Signed)
[place]   MRN: [id number] DOB: [birth year]  Subjective:   Shelby Rubio is a 21 y.o. female presenting for 1 week history of persistent urinary frequency, persistent thirstiness and dry mouth. Has family history of type 2 diabetes.  One of her family members passed away from a hypoglycemic coma.  Patient would like to start diabetes treatment.  She also has concerns about a yeast vaginitis.  Denies any fever, confusion, headache, vision changes, chest pain, shortness of breath, nausea, vomiting, abdominal pain, hematuria, genital rashes.  She has a primary care doctor but has not seen them recently.  No current facility-administered medications for this encounter.  Current Outpatient Medications:    levonorgestrel (MIRENA) 20 MCG/DAY IUD, 1 each by Intrauterine route once., Disp: , Rfl:    blood glucose meter kit and supplies, Dispense based on patient and insurance preference. Use up to four times daily as directed. (FOR ICD-10 E10.9, E11.9)., Disp: 1 each, Rfl: 0   doxycycline (VIBRAMYCIN) 100 MG capsule, Take 1 capsule (100 mg total) by mouth 2 (two) times daily., Disp: 20 capsule, Rfl: 0   FLUoxetine (PROZAC) 20 MG tablet, Take 1 tablet (20 mg total) by mouth daily., Disp: 30 tablet, Rfl: 1   fluticasone (FLONASE) 50 MCG/ACT nasal spray, Place 2 sprays into both nostrils daily., Disp: 16 g, Rfl: 0   loratadine (CLARITIN) 10 MG tablet, Take 1 tablet (10 mg total) by mouth daily., Disp: 30 tablet, Rfl: 0   Norgestimate-Ethinyl Estradiol Triphasic (ORTHO TRI-CYCLEN, 28,) 0.18/0.215/0.25 MG-35 MCG tablet, Take 1 tablet by mouth daily., Disp: 3 Package, Rfl: 3   Allergies  Allergen Reactions   Penicillins Hives   Amoxil [Amoxicillin] Hives    Past Medical History:  Diagnosis Date   Behavior disorder    Depression      Past Surgical History:  Procedure Laterality Date   ADENOIDECTOMY     TONSILLECTOMY     tubes in ears      Family History  Problem Relation Age  of Onset   Diabetes Mother    Hypertension Mother    Asthma Sister    Asthma Brother    Diabetes Other     Social History   Tobacco Use   Smoking status: Every Day    Packs/day: 1.00    Types: Cigarettes   Smokeless tobacco: Never  Vaping Use   Vaping Use: Never used  Substance Use Topics   Alcohol use: No   Drug use: Never    ROS   Objective:   Vitals: BP (!) 147/108 (BP Location: Right Arm)    Pulse (!) 125    Temp 98.5 F (36.9 C) (Oral)    Resp 18    SpO2 98%   BP Readings from Last 3 Encounters:  08/06/21 (!) 147/108  10/29/20 127/84  07/01/20 115/78   Physical Exam Constitutional:      General: She is not in acute distress.    Appearance: Normal appearance. She is well-developed. She is obese. She is not ill-appearing, toxic-appearing or diaphoretic.  HENT:     Head: Normocephalic and atraumatic.     Nose: Nose normal.     Mouth/Throat:     Mouth: Mucous membranes are moist.  Eyes:     General: No scleral icterus.       Right eye: No discharge.        Left eye: No discharge.     Extraocular Movements: Extraocular movements intact.     Conjunctiva/sclera: Conjunctivae normal.  Cardiovascular:     Rate and Rhythm: Regular rhythm. Tachycardia present.     Pulses: Normal pulses.     Heart sounds: Normal heart sounds. No murmur heard.   No friction rub. No gallop.  Pulmonary:     Effort: Pulmonary effort is normal. No respiratory distress.     Breath sounds: Normal breath sounds. No stridor. No wheezing, rhonchi or rales.  Abdominal:     General: Bowel sounds are normal. There is no distension.     Palpations: Abdomen is soft. There is no mass.     Tenderness: There is no abdominal tenderness. There is no right CVA tenderness, left CVA tenderness, guarding or rebound.  Skin:    General: Skin is warm and dry.     Findings: No rash.  Neurological:     General: No focal deficit present.     Mental Status: She is alert and oriented to person, place,  and time.  Psychiatric:        Mood and Affect: Mood normal.        Behavior: Behavior normal.        Thought Content: Thought content normal.        Judgment: Judgment normal.    Results for orders placed or performed during the hospital encounter of 08/06/21 (from the past 24 hour(s))  POCT CBG (manual entry)     Status: Abnormal   Collection Time: 08/06/21  4:16 PM  Result Value Ref Range   POCT Glucose ([facility]) 333 (A) 70 - 99 mg/dL  POCT urinalysis dipstick     Status: Abnormal   Collection Time: 08/06/21  4:31 PM  Result Value Ref Range   Color, UA yellow yellow   Clarity, UA clear clear   Glucose, UA >=1,000 (A) negative mg/dL   Bilirubin, UA negative negative   Ketones, POC UA moderate (40) (A) negative mg/dL   Spec Grav, UA 1.020 1.010 - 1.025   Blood, UA large (A) negative   pH, UA 5.5 5.0 - 8.0   Protein Ur, POC =30 (A) negative mg/dL   Urobilinogen, UA 0.2 0.2 or 1.0 E.U./dL   Nitrite, UA Negative Negative   Leukocytes, UA Negative Negative    Assessment and Plan :   PDMP not reviewed this encounter.  1. New onset type 2 diabetes mellitus ([place])   2. Tachycardia   3. Hyperglycemia   4. Subacute cough   5. Yeast vaginitis    Patient does not have a history of kidney disease and as her blood sugars severely elevated, has uncontrolled diabetes recommended starting metformin and titrating up to 1030m twice daily.  Advised that she make significant dietary modifications.  Start lisinopril 5 mg for renal protection.  Use fluconazole for yeast vaginitis likely secondary to her uncontrolled diabetes.  No signs of urinary tract infection.  Low suspicion for diabetic ketoacidosis but patient is borderline and therefore reviewed signs and symptoms of this, patient verbalizes understanding.  Counseled patient on potential for adverse effects with medications prescribed/recommended today, ER and return-to-clinic precautions discussed, patient verbalized understanding.    MJaynee Eagles PVermont01/06/23 1702

## 2021-08-06 NOTE — ED Triage Notes (Signed)
Pt reports she is being thirsty, dry mouth, increased urinary frequency recurrent yeast infection x 1 week. Pt reports blood sugar was 363 fasting today

## 2021-08-06 NOTE — ED Notes (Signed)
Provider notified of BG level

## 2021-08-07 LAB — COVID-19, FLU A+B NAA
Influenza A, NAA: NOT DETECTED
Influenza B, NAA: NOT DETECTED
SARS-CoV-2, NAA: NOT DETECTED

## 2021-08-07 LAB — COMPREHENSIVE METABOLIC PANEL
ALT: 17 IU/L (ref 0–32)
AST: 13 IU/L (ref 0–40)
Albumin/Globulin Ratio: 2 (ref 1.2–2.2)
Albumin: 5.1 g/dL — ABNORMAL HIGH (ref 3.9–5.0)
Alkaline Phosphatase: 166 IU/L — ABNORMAL HIGH (ref 44–121)
BUN/Creatinine Ratio: 15 (ref 9–23)
BUN: 11 mg/dL (ref 6–20)
Bilirubin Total: 0.3 mg/dL (ref 0.0–1.2)
CO2: 22 mmol/L (ref 20–29)
Calcium: 10 mg/dL (ref 8.7–10.2)
Chloride: 98 mmol/L (ref 96–106)
Creatinine, Ser: 0.75 mg/dL (ref 0.57–1.00)
Globulin, Total: 2.6 g/dL (ref 1.5–4.5)
Glucose: 327 mg/dL — ABNORMAL HIGH (ref 70–99)
Potassium: 4.3 mmol/L (ref 3.5–5.2)
Sodium: 139 mmol/L (ref 134–144)
Total Protein: 7.7 g/dL (ref 6.0–8.5)
eGFR: 115 mL/min/{1.73_m2} (ref >59)

## 2021-09-01 DIAGNOSIS — E119 Type 2 diabetes mellitus without complications: Secondary | ICD-10-CM

## 2021-09-01 HISTORY — DX: Type 2 diabetes mellitus without complications: E11.9

## 2021-09-09 ENCOUNTER — Ambulatory Visit (INDEPENDENT_AMBULATORY_CARE_PROVIDER_SITE_OTHER): Payer: BC Managed Care – PPO | Admitting: Family

## 2021-09-09 ENCOUNTER — Encounter: Payer: Self-pay | Admitting: Family

## 2021-09-09 ENCOUNTER — Other Ambulatory Visit: Payer: Self-pay

## 2021-09-09 VITALS — BP 114/80 | HR 88 | Temp 98.3°F | Ht 64.0 in | Wt 215.2 lb

## 2021-09-09 DIAGNOSIS — Z794 Long term (current) use of insulin: Secondary | ICD-10-CM | POA: Insufficient documentation

## 2021-09-09 DIAGNOSIS — E669 Obesity, unspecified: Secondary | ICD-10-CM

## 2021-09-09 DIAGNOSIS — E1169 Type 2 diabetes mellitus with other specified complication: Secondary | ICD-10-CM | POA: Insufficient documentation

## 2021-09-09 DIAGNOSIS — E1159 Type 2 diabetes mellitus with other circulatory complications: Secondary | ICD-10-CM | POA: Diagnosis not present

## 2021-09-09 DIAGNOSIS — I152 Hypertension secondary to endocrine disorders: Secondary | ICD-10-CM

## 2021-09-09 HISTORY — DX: Type 2 diabetes mellitus with other specified complication: E11.69

## 2021-09-09 HISTORY — DX: Type 2 diabetes mellitus with other specified complication: E66.9

## 2021-09-09 LAB — LIPID PANEL
Cholesterol: 136 mg/dL (ref 0–200)
HDL: 33 mg/dL — ABNORMAL LOW (ref >39.00)
LDL Cholesterol: 79 mg/dL (ref 0–99)
NonHDL: 102.86
Total CHOL/HDL Ratio: 4
Triglycerides: 119 mg/dL (ref 0.0–149.0)
VLDL: 23.8 mg/dL (ref 0.0–40.0)

## 2021-09-09 LAB — HEMOGLOBIN A1C: Hgb A1c MFr Bld: 8.9 % — ABNORMAL HIGH (ref 4.6–6.5)

## 2021-09-09 MED ORDER — TRULICITY 0.75 MG/0.5ML ~~LOC~~ SOAJ: 0.7500 mg | SUBCUTANEOUS | 0 refills | Status: DC

## 2021-09-09 MED ORDER — METFORMIN HCL ER 500 MG PO TB24: 500.0000 mg | ORAL_TABLET | Freq: Two times a day (BID) | ORAL | 1 refills | Status: DC

## 2021-09-09 NOTE — Progress Notes (Signed)
New Patient Office Visit  Subjective:  Patient ID: Shelby Rubio, female    DOB: [birth year]  Age: 21 y.o. MRN: [id number]  CC:  Chief Complaint  Patient presents with   Diabetes   Establish Care   Hypertension   Depression    HPI Shelby Rubio presents for establishing care and to discuss a couple of problems.  T2DM: Pt is currently maintained on the following medications for diabetes:Metformin bid Denies polyuria/polydipsia/polyphagia Denies hypoglycemia Home glucose readings range: 140-150 fasting; 180-200 postprandiol Last A1C was  Lab Results  Component Value Date   HGBA1C 5.1 03/27/2019   Hypertension: Patient is currently maintained on the following medications for blood pressure: Lisinopril Patient reports good compliance with blood pressure medications. Patient denies chest pain, headaches, shortness of breath or swelling. Last 3 blood pressure readings in our office are as follows: BP Readings from Last 3 Encounters:  09/09/21 114/80  08/06/21 (!) 147/108  10/29/20 127/84     Past Medical History:  Diagnosis Date   Anxiety 2011?   When diagnosed depressed   Asthma    When i was little   Behavior disorder    Depression     Past Surgical History:  Procedure Laterality Date   ADENOIDECTOMY     TONSILLECTOMY     tubes in ears      Family History  Problem Relation Age of Onset   Diabetes Mother    Hypertension Mother    Obesity Mother    Asthma Sister    Asthma Brother    Diabetes Other    ADD / ADHD Father    Alcohol abuse Father    Anxiety disorder Father    Arthritis Father    Depression Father    Stroke Father    Arthritis Maternal Grandfather    Asthma Maternal Grandfather    Cancer Maternal Grandfather    Diabetes Maternal Grandfather    Hearing loss Maternal Grandfather    Kidney disease Maternal Grandfather    Arthritis Maternal Grandmother    Miscarriages / Stillbirths Maternal Grandmother    ADD / ADHD Paternal Grandfather     Anxiety disorder Paternal Grandfather    Arthritis Paternal Grandfather    Depression Paternal Grandfather    Diabetes Paternal Grandfather    Early death Paternal Grandfather    Heart disease Paternal Grandfather    Anxiety disorder Paternal Grandmother    Depression Paternal Grandmother    Diabetes Paternal Grandmother    Obesity Paternal Grandmother    ADD / ADHD Brother    Anxiety disorder Brother    Asthma Brother    Depression Brother    ADD / ADHD Brother    ADD / ADHD Paternal Uncle    Anxiety disorder Paternal Uncle    Arthritis Paternal Uncle    Asthma Paternal Uncle    Depression Paternal Uncle    Diabetes Paternal Uncle    Hypertension Paternal Uncle    Obesity Paternal Uncle    ADD / ADHD Paternal Uncle    Anxiety disorder Paternal Uncle    Arthritis Paternal Uncle    Asthma Paternal Uncle    Depression Paternal Uncle    Diabetes Paternal Uncle    Hypertension Paternal Uncle    Obesity Paternal Uncle    Anxiety disorder Sister    Asthma Sister    Depression Sister    Miscarriages / Stillbirths Sister    Anxiety disorder Paternal Uncle    Depression Paternal Uncle  Diabetes Paternal Uncle    Learning disabilities Paternal Uncle    Obesity Paternal Uncle    Diabetes Paternal Uncle    Kidney disease Paternal Uncle    Obesity Paternal Uncle    Miscarriages / Stillbirths Maternal Aunt    Obesity Maternal Aunt     Social History   Socioeconomic History   Marital status: Single    Spouse name: Not on file   Number of children: Not on file   Years of education: Not on file   Highest education level: Not on file  Occupational History   Not on file  Tobacco Use   Smoking status: Every Day    Packs/day: 1.00    Years: 4.00    Pack years: 4.00    Types: Cigarettes   Smokeless tobacco: Never  Vaping Use   Vaping Use: Never used  Substance and Sexual Activity   Alcohol use: No   Drug use: Never   Sexual activity: Yes    Birth control/protection:  Abstinence, Condom, I.U.D.  Other Topics Concern   Not on file  Social History Narrative   Not on file   Social Determinants of Health   Financial Resource Strain: Not on file  Food Insecurity: Not on file  Transportation Needs: Not on file  Physical Activity: Not on file  Stress: Not on file  Social Connections: Not on file  Intimate Partner Violence: Not on file    Objective:   Today's Vitals: BP 114/80    Pulse 88    Temp 98.3 F (36.8 C) (Temporal)    Ht _0  (1.626 m)    Wt 215 lb 3.2 oz (97.6 kg)    LMP 09/02/2021 (Exact Date)    SpO2 99%    BMI 36.94 kg/m   Physical Exam Vitals and nursing note reviewed.  Constitutional:      Appearance: Normal appearance. She is obese.  Cardiovascular:     Rate and Rhythm: Normal rate and regular rhythm.  Pulmonary:     Effort: Pulmonary effort is normal.     Breath sounds: Normal breath sounds.  Musculoskeletal:        General: Normal range of motion.  Skin:    General: Skin is warm and dry.  Neurological:     Mental Status: She is alert.  Psychiatric:        Mood and Affect: Mood normal.        Behavior: Behavior normal.    Assessment & Plan:   Problem List Items Addressed This Visit       Cardiovascular and Mediastinum   Hypertension associated with diabetes ([place])    NEW - started on Lisinopril in ER, tolerating, BP good today.      Relevant Medications   Dulaglutide (TRULICITY) 4.01 UU/7.2ZD SOPN   metFORMIN (GLUCOPHAGE XR) 500 MG 24 hr tablet     Endocrine   Type 2 diabetes mellitus with obesity ([place]) - Primary    NEW - started on Lisinopril and Metformin bid causing diarrhea- will switch to ER, reports blood sugar >360 in ER, now down to 140-150 fasting and 180-200 postprandiol. pt very motivated to get under control. Starting Trulicity, advised on use & SE and to schedule a nurse visit to learn how to use after picking up from pharmacy. f/u in 1 month.      Relevant Medications   Dulaglutide (TRULICITY)  6.64 QI/3.4VQ SOPN   metFORMIN (GLUCOPHAGE XR) 500 MG 24 hr tablet   Other Relevant Orders  HgB A1c   Lipid panel    Outpatient Encounter Medications as of 09/09/2021  Medication Sig   Dulaglutide (TRULICITY) 6.04 NV/9.8XA SOPN Inject 0.75 mg into the skin once a week.   levonorgestrel (MIRENA) 20 MCG/DAY IUD 1 each by Intrauterine route once.   lisinopril (ZESTRIL) 5 MG tablet Take 1 tablet (5 mg total) by mouth daily.   metFORMIN (GLUCOPHAGE XR) 500 MG 24 hr tablet Take 1 tablet (500 mg total) by mouth 2 (two) times daily after a meal.   [DISCONTINUED] metFORMIN (GLUCOPHAGE) 1000 MG tablet Take 1 tablet (1,000 mg total) by mouth 2 (two) times daily with a meal.   blood glucose meter kit and supplies Dispense based on patient and insurance preference. Use up to four times daily as directed. (FOR ICD-10 E10.9, E11.9).   [DISCONTINUED] doxycycline (VIBRAMYCIN) 100 MG capsule Take 1 capsule (100 mg total) by mouth 2 (two) times daily.   [DISCONTINUED] fluconazole (DIFLUCAN) 150 MG tablet Take 1 tablet (150 mg total) by mouth once a week.   [DISCONTINUED] FLUoxetine (PROZAC) 20 MG tablet Take 1 tablet (20 mg total) by mouth daily.   [DISCONTINUED] fluticasone (FLONASE) 50 MCG/ACT nasal spray Place 2 sprays into both nostrils daily.   [DISCONTINUED] loratadine (CLARITIN) 10 MG tablet Take 1 tablet (10 mg total) by mouth daily.   [DISCONTINUED] Norgestimate-Ethinyl Estradiol Triphasic (ORTHO TRI-CYCLEN, 28,) 0.18/0.215/0.25 MG-35 MCG tablet Take 1 tablet by mouth daily.   No facility-administered encounter medications on file as of 09/09/2021.    Follow-up: Return in about 4 weeks (around 10/07/2021) for diabetes f/u.   Jeanie Sewer, NP

## 2021-09-09 NOTE — Assessment & Plan Note (Signed)
NEW - started on Lisinopril and Metformin bid causing diarrhea- will switch to ER, reports blood sugar >360 in ER, now down to 140-150 fasting and 180-200 postprandiol. pt very motivated to get under control. Starting Trulicity, advised on use & SE and to schedule a nurse visit to learn how to use after picking up from pharmacy. f/u in 1 month.

## 2021-09-09 NOTE — Assessment & Plan Note (Signed)
NEW - started on Lisinopril in ER, tolerating, BP good today.

## 2021-09-09 NOTE — Patient Instructions (Addendum)
Welcome to [facility]! It was a pleasure meeting you today.  Go to the lab for blood work today. As discussed, I have sent Trulicity to your pharmacy, schedule a nurse visit to learn how to do this injection after you pick it up. If it is too expensive, let me know. Let us know through MyChart when you need refills, and I have changed the Metformin to ER which should help the diarrhea. Please schedule a 1 month follow up visit today.    PLEASE NOTE:  If you had any LAB tests please let us know if you have not heard back within a few days. You may see your results on MyChart before we have a chance to review them but we will give you a call once they are reviewed by [place]. If we ordered any REFERRALS today, please let us know if you have not heard from their office within the next week.  Let us know through MyChart if you are needing REFILLS, or have your pharmacy send [place] the request. You can also use MyChart to communicate with me or any office staff.  Please try these tips to maintain a healthy lifestyle:  Eat most of your calories during the day when you are active. Eliminate processed foods including packaged sweets (pies, cakes, cookies), reduce intake of potatoes, white bread, white pasta, and white rice. Look for whole grain options, oat flour or almond flour.  Each meal should contain half fruits/vegetables, one quarter protein, and one quarter carbs (no bigger than a computer mouse).  Cut down on sweet beverages. This includes juice, soda, and sweet tea. Also watch fruit intake, though this is a healthier sweet option, it still contains natural sugar! Limit to 3 servings daily.  Drink at least 1 glass of water with each meal and aim for at least 8 glasses per day  Exercise at least 150 minutes every week.

## 2021-09-14 ENCOUNTER — Encounter: Payer: Self-pay | Admitting: Family

## 2021-09-14 DIAGNOSIS — E1169 Type 2 diabetes mellitus with other specified complication: Secondary | ICD-10-CM

## 2021-10-07 ENCOUNTER — Other Ambulatory Visit: Payer: Self-pay

## 2021-10-07 ENCOUNTER — Encounter: Payer: Self-pay | Admitting: Family

## 2021-10-07 ENCOUNTER — Ambulatory Visit (INDEPENDENT_AMBULATORY_CARE_PROVIDER_SITE_OTHER): Payer: BC Managed Care – PPO | Admitting: Family

## 2021-10-07 DIAGNOSIS — E1159 Type 2 diabetes mellitus with other circulatory complications: Secondary | ICD-10-CM

## 2021-10-07 DIAGNOSIS — E669 Obesity, unspecified: Secondary | ICD-10-CM | POA: Diagnosis not present

## 2021-10-07 DIAGNOSIS — E1169 Type 2 diabetes mellitus with other specified complication: Secondary | ICD-10-CM

## 2021-10-07 DIAGNOSIS — I152 Hypertension secondary to endocrine disorders: Secondary | ICD-10-CM

## 2021-10-07 MED ORDER — OZEMPIC (0.25 OR 0.5 MG/DOSE) 2 MG/1.5ML ~~LOC~~ SOPN: 0.2500 mg | PEN_INJECTOR | SUBCUTANEOUS | 0 refills | Status: DC

## 2021-10-07 NOTE — Assessment & Plan Note (Addendum)
Reports fasting CBGs 120s and postprandiols 150s - better overall. Tolerating Metformin ER, but Trulicity caused stomach cramps. She called in and was advised to wait an extra few days before doing the next dose, but states it still caused cramping the next day. Switching to Ozempic, advised on use & SE. f/u in 1 mo. ?

## 2021-10-07 NOTE — Progress Notes (Signed)
? ?Subjective:  ? ? ? Patient ID: Shelby Rubio, female    DOB: 05-08-2001, 21 y.o.   MRN: [id number] ? ?Chief Complaint  ?Patient presents with  ? Diabetes  ?  Taking as prescribed  ?No issues with glucose since last visit   ? Medication Reaction  ?  Stomach problems due to Rx trulicity   ? ?HPI: ?T2DM: Pt is currently maintained on the following medications for diabetes:Trulicity,  ?Failed meds include: none ?Denies polyuria/polydipsia/polyphagia ?Denies hypoglycemia ?Home glucose readings range: 120-150 ?Last A1C was  ?Lab Results  ?Component Value Date  ? HGBA1C 8.9 (H) 09/09/2021  ? HGBA1C 5.1 03/27/2019  ?Hypertension: Patient is currently maintained on the following medications for blood pressure: Lisinopril ?Failed meds include: none ?Patient reports good compliance with blood pressure medications. ?Patient denies chest pain, headaches, shortness of breath or swelling. ?Last 3 blood pressure readings in our office are as follows: ?BP Readings from Last 3 Encounters:  ?10/07/21 111/77  ?09/09/21 114/80  ?08/06/21 (!) 147/108  ? ? ?Health Maintenance Due  ?Topic Date Due  ? FOOT EXAM  Never done  ? OPHTHALMOLOGY EXAM  Never done  ? HPV VACCINES (1 - 2-dose series) Never done  ? HIV Screening  Never done  ? Hepatitis C Screening  Never done  ? TETANUS/TDAP  02/11/2020  ? PAP-Cervical Cytology Screening  Never done  ? PAP SMEAR-Modifier  Never done  ? ? ?Past Medical History:  ?Diagnosis Date  ? Anxiety 2011?  ? When diagnosed depressed  ? Asthma   ? When i was little  ? Behavior disorder   ? Depression   ? Dizziness 03/01/2019  ? Lower abdominal pain 06/06/2018  ? ? ?Past Surgical History:  ?Procedure Laterality Date  ? ADENOIDECTOMY    ? TONSILLECTOMY    ? tubes in ears    ? ? ?Outpatient Medications Prior to Visit  ?Medication Sig Dispense Refill  ? levonorgestrel (MIRENA) 20 MCG/DAY IUD 1 each by Intrauterine route once.    ? lisinopril (ZESTRIL) 5 MG tablet Take 1 tablet (5 mg total) by mouth daily. 90 tablet 0   ? metFORMIN (GLUCOPHAGE XR) 500 MG 24 hr tablet Take 1 tablet (500 mg total) by mouth 2 (two) times daily after a meal. 180 tablet 1  ? Dulaglutide (TRULICITY) 0.75 MG/0.5ML SOPN Inject 0.75 mg into the skin once a week. 2 mL 0  ? ?No facility-administered medications prior to visit.  ? ? ?Allergies  ?Allergen Reactions  ? Penicillins Hives  ? Amoxil [Amoxicillin] Hives  ?   ?Objective:  ?  ?Physical Exam ?Vitals and nursing note reviewed.  ?Constitutional:   ?   Appearance: Normal appearance.  ?Cardiovascular:  ?   Rate and Rhythm: Normal rate and regular rhythm.  ?Pulmonary:  ?   Effort: Pulmonary effort is normal.  ?   Breath sounds: Normal breath sounds.  ?Musculoskeletal:     ?   General: Normal range of motion.  ?Skin: ?   General: Skin is warm and dry.  ?Neurological:  ?   Mental Status: She is alert.  ?Psychiatric:     ?   Mood and Affect: Mood normal.     ?   Behavior: Behavior normal.  ? ? ?BP 111/77   Pulse 88   Temp 98.6 ?F (37 ?C) (Temporal)   Ht 5\' 4"  (1.626 m)   Wt 212 lb 6.4 oz (96.3 kg)   LMP 09/30/2021   SpO2 99%   BMI 36.46  kg/m?  ?Wt Readings from Last 3 Encounters:  ?10/07/21 212 lb 6.4 oz (96.3 kg)  ?09/09/21 215 lb 3.2 oz (97.6 kg)  ?10/29/20 225 lb (102.1 kg)  ? ? ?   ?Assessment & Plan:  ? ?Problem List Items Addressed This Visit   ? ?  ? Cardiovascular and Mediastinum  ? Hypertension associated with diabetes (HCC)  ?  BP still good, continue Lisinopril ?  ?  ? Relevant Medications  ? Semaglutide,0.25 or 0.5MG /DOS, (OZEMPIC, 0.25 OR 0.5 MG/DOSE,) 2 MG/1.5ML SOPN  ?  ? Endocrine  ? Type 2 diabetes mellitus with obesity (HCC)  ?  Reports fasting CBGs 120s and postprandiols 150s - better overall. Tolerating Metformin ER, but Trulicity caused stomach cramps. She called in and was advised to wait an extra few days before doing the next dose, but states it still caused cramping the next day. Switching to Ozempic, advised on use & SE. f/u in 1 mo. ?  ?  ? Relevant Medications  ?  Semaglutide,0.25 or 0.5MG /DOS, (OZEMPIC, 0.25 OR 0.5 MG/DOSE,) 2 MG/1.5ML SOPN  ? ? ?Meds ordered this encounter  ?Medications  ? Semaglutide,0.25 or 0.5MG /DOS, (OZEMPIC, 0.25 OR 0.5 MG/DOSE,) 2 MG/1.5ML SOPN  ?  Sig: Inject 0.25 mg into the skin once a week. If no side effects, ok to increase to 0.5mg  weekly. Change needle weekly, do not throw pen away.  ?  Dispense:  1.5 mL  ?  Refill:  0  ?  Order Specific Question:   Supervising Provider  ?  Answer:   ANDY, CAMILLE L [[id number]]  ? ? ?Dulce Sellar, NP ? ?

## 2021-10-07 NOTE — Assessment & Plan Note (Signed)
BP still good, continue Lisinopril ?

## 2021-10-07 NOTE — Patient Instructions (Signed)
It was very nice to see you today! ? ?Continue the Metformin twice a day. I have sent Ozempic to your pharmacy to replace the Trulicity. Let me know if this is too expensive. ? ?Schedule a virtual or office visit for 1 month. ? ? ? ?PLEASE NOTE: ? ?If you had any lab tests please let us know if you have not heard back within a few days. You may see your results on MyChart before we have a chance to review them but we will give you a call once they are reviewed by [place]. If we ordered any referrals today, please let us know if you have not heard from their office within the next week.  ? ?Please try these tips to maintain a healthy lifestyle: ? ?Eat most of your calories during the day when you are active. Eliminate processed foods including packaged sweets (pies, cakes, cookies), reduce intake of potatoes, white bread, white pasta, and white rice. Look for whole grain options, oat flour or almond flour. ? ?Each meal should contain half fruits/vegetables, one quarter protein, and one quarter carbs (no bigger than a computer mouse). ? ?Cut down on sweet beverages. This includes juice, soda, and sweet tea. Also watch fruit intake, though this is a healthier sweet option, it still contains natural sugar! Limit to 3 servings daily. ? ?Drink at least 1 glass of water with each meal and aim for at least 8 glasses per day ? ?Exercise at least 150 minutes every week.  ? ?

## 2021-10-17 MED ORDER — BYDUREON BCISE 2 MG/0.85ML ~~LOC~~ AUIJ: 2.0000 mg | AUTO-INJECTOR | SUBCUTANEOUS | 0 refills | Status: DC

## 2021-11-04 ENCOUNTER — Other Ambulatory Visit: Payer: Self-pay | Admitting: Family

## 2021-11-04 DIAGNOSIS — E669 Obesity, unspecified: Secondary | ICD-10-CM

## 2021-11-08 ENCOUNTER — Telehealth: Payer: BC Managed Care – PPO | Admitting: Family

## 2021-11-08 ENCOUNTER — Other Ambulatory Visit: Payer: Self-pay | Admitting: Family

## 2021-11-08 DIAGNOSIS — E1169 Type 2 diabetes mellitus with other specified complication: Secondary | ICD-10-CM

## 2021-11-08 MED ORDER — METFORMIN HCL ER 500 MG PO TB24: 500.0000 mg | ORAL_TABLET | Freq: Two times a day (BID) | ORAL | 1 refills | Status: DC

## 2021-11-08 MED ORDER — TRULICITY 0.75 MG/0.5ML ~~LOC~~ SOAJ: 0.7500 mg | SUBCUTANEOUS | 0 refills | Status: DC

## 2021-11-08 NOTE — Telephone Encounter (Signed)
Did Shelby Rubio start this? If yes, remind her to schedule a 1 month follow up visit after starting if she hasn't already, thx ?

## 2021-11-08 NOTE — Telephone Encounter (Signed)
Pt states she was not able to start this yet due to not being able to afford it. She has an appointment scheduled with you for tomorrow morning and would like to discuss cheaper options.  ?

## 2021-11-09 ENCOUNTER — Telehealth (INDEPENDENT_AMBULATORY_CARE_PROVIDER_SITE_OTHER): Payer: BC Managed Care – PPO | Admitting: Family

## 2021-11-09 ENCOUNTER — Encounter: Payer: Self-pay | Admitting: Family

## 2021-11-09 DIAGNOSIS — E669 Obesity, unspecified: Secondary | ICD-10-CM | POA: Diagnosis not present

## 2021-11-09 DIAGNOSIS — E1169 Type 2 diabetes mellitus with other specified complication: Secondary | ICD-10-CM

## 2021-11-09 DIAGNOSIS — E1159 Type 2 diabetes mellitus with other circulatory complications: Secondary | ICD-10-CM | POA: Diagnosis not present

## 2021-11-09 DIAGNOSIS — I152 Hypertension secondary to endocrine disorders: Secondary | ICD-10-CM

## 2021-11-09 MED ORDER — LISINOPRIL 5 MG PO TABS: 5.0000 mg | ORAL_TABLET | Freq: Every day | ORAL | 1 refills | Status: DC

## 2021-11-09 MED ORDER — TRULICITY 0.75 MG/0.5ML ~~LOC~~ SOAJ: 0.7500 mg | SUBCUTANEOUS | 0 refills | Status: DC

## 2021-11-09 NOTE — Progress Notes (Signed)
? ? ?MyChart Video Visit ? ? ? ?Virtual Visit via Video Note  ? ?This visit type was conducted due to national recommendations for restrictions regarding the COVID-19 Pandemic (e.g. social distancing) in an effort to limit this patient's exposure and mitigate transmission in our community. This patient is at least at moderate Rubio for complications without adequate follow up. This format is felt to be most appropriate for this patient at this time. Physical exam was limited by quality of the video and audio technology used for the visit. CMA was able to get the patient set up on a video visit. ? ?Patient location: Home. Patient and provider in visit ?Provider location: Office ? ?I discussed the limitations of evaluation and management by telemedicine and the availability of in person appointments. The patient expressed understanding and agreed to proceed. ? ?Visit Date: 11/09/2021 ? ?Today's healthcare provider: Dulce Sellar, NP  ? ? ? ?Subjective:  ? ? Patient ID: Shelby Rubio, female    DOB: May 24, 2001, 21 y.o.   MRN: [id number] ? ?Chief Complaint  ?Patient presents with  ? Hypertension  ? Diabetes  ?  Pt states her blood sugar have been a little high at home.  ? ?HPI ?T2DM: Pt is currently maintained on the following medications for diabetes: Metformin, Trulicity for 3 weeks, caused stomach cramping & tried to switch meds, but none covered. ?Denies polyuria/polydipsia/polyphagia ?Denies hypoglycemia ?Home glucose readings range: 130-160 fasting  ?Last A1C was  ?Lab Results  ?Component Value Date  ? HGBA1C 8.9 (H) 09/09/2021  ? HGBA1C 5.1 03/27/2019  ?  ?Past Medical History:  ?Diagnosis Date  ? Anxiety 2011?  ? When diagnosed depressed  ? Asthma   ? When i was little  ? Behavior disorder   ? Dizziness 03/01/2019  ? Lower abdominal pain 06/06/2018  ? ?Past Surgical History:  ?Procedure Laterality Date  ? ADENOIDECTOMY    ? TONSILLECTOMY    ? tubes in ears    ? ?Outpatient Medications Prior to Visit  ?Medication  Sig Dispense Refill  ? levonorgestrel (MIRENA) 20 MCG/DAY IUD 1 each by Intrauterine route once.    ? metFORMIN (GLUCOPHAGE XR) 500 MG 24 hr tablet Take 1 tablet (500 mg total) by mouth 2 (two) times daily after a meal. 180 tablet 1  ? lisinopril (ZESTRIL) 5 MG tablet Take 1 tablet (5 mg total) by mouth daily. 90 tablet 0  ? Dulaglutide (TRULICITY) 0.75 MG/0.5ML SOPN Inject 0.75 mg into the skin once a week. (Patient not taking: Reported on 11/09/2021) 2 mL 0  ? ?No facility-administered medications prior to visit.  ? ? ?Allergies  ?Allergen Reactions  ? Penicillins Hives  ? Amoxil [Amoxicillin] Hives  ? ?   ?Objective:  ?  ? ?Physical Exam ?Vitals and nursing note reviewed.  ?Constitutional:   ?   General: She is not in acute distress. ?   Appearance: Normal appearance.  ?HENT:  ?   Head: Normocephalic.  ?Pulmonary:  ?   Effort: No respiratory distress.  ?Musculoskeletal:  ?   Cervical back: Normal range of motion.  ?Skin: ?   General: Skin is dry.  ?   Coloration: Skin is not pale.  ?Neurological:  ?   Mental Status: She is alert and oriented to person, place, and time.  ?Psychiatric:     ?   Mood and Affect: Mood normal.  ? ?Ht 5\' 4"  (1.626 m)   Wt 208 lb 2 oz (94.4 kg)   LMP 10/27/2021 (Approximate)  BMI 35.72 kg/m?  ? ?Wt Readings from Last 3 Encounters:  ?11/09/21 208 lb 2 oz (94.4 kg)  ?10/07/21 212 lb 6.4 oz (96.3 kg)  ?09/09/21 215 lb 3.2 oz (97.6 kg)  ? ?   ?Assessment & Plan:  ? ?Problem List Items Addressed This Visit   ? ?  ? Cardiovascular and Mediastinum  ? Hypertension associated with diabetes (HCC)  ?  Chronic - refilling Lisinopril ?  ?  ? Relevant Medications  ? lisinopril (ZESTRIL) 5 MG tablet  ? Dulaglutide (TRULICITY) 0.75 MG/0.5ML SOPN  ?  ? Endocrine  ? Type 2 diabetes mellitus with obesity (HCC)  ?  pt started Trulicity but developed stomach cramping, no nausea after 3 weeks, tried to switch to [employer] or [employer] but neither covered. pt wants to retry Trulicity, advised eating mini-meals  every 2-3 hours, avoid any gassy foods, and go 8-9 days in between injections to see if can avoid cramping. pt out of metformin the last week, refill sent again yesterday. f/u in one month. ?  ?  ? Relevant Medications  ? lisinopril (ZESTRIL) 5 MG tablet  ? Dulaglutide (TRULICITY) 0.75 MG/0.5ML SOPN  ? ? ?Meds ordered this encounter  ?Medications  ? lisinopril (ZESTRIL) 5 MG tablet  ?  Sig: Take 1 tablet (5 mg total) by mouth daily.  ?  Dispense:  90 tablet  ?  Refill:  1  ?  Order Specific Question:   Supervising Provider  ?  Answer:   ANDY, CAMILLE L [[id number]]  ? Dulaglutide (TRULICITY) 0.75 MG/0.5ML SOPN  ?  Sig: Inject 0.75 mg into the skin once a week.  ?  Dispense:  2 mL  ?  Refill:  0  ?  Order Specific Question:   Supervising Provider  ?  Answer:   ANDY, CAMILLE L [[id number]]  ? ? ?I discussed the assessment and treatment plan with the patient. The patient was provided an opportunity to ask questions and all were answered. The patient agreed with the plan and demonstrated an understanding of the instructions. ?  ?The patient was advised to call back or seek an in-person evaluation if the symptoms worsen or if the condition fails to improve as anticipated. ? ?I provided 24 minutes of face-to-face time during this encounter. ? ? ?Dulce Sellar, NP ?[facility] ?[phone] (phone) ?[phone] (fax) ? ?[facility]  ?

## 2021-11-09 NOTE — Assessment & Plan Note (Signed)
Chronic - refilling Lisinopril ?

## 2021-11-09 NOTE — Assessment & Plan Note (Signed)
pt started Trulicity but developed stomach cramping, no nausea after 3 weeks, tried to switch to [employer] or [employer] but neither covered. pt wants to retry Trulicity, advised eating mini-meals every 2-3 hours, avoid any gassy foods, and go 8-9 days in between injections to see if can avoid cramping. pt out of metformin the last week, refill sent again yesterday. f/u in one month. ?

## 2021-12-07 ENCOUNTER — Other Ambulatory Visit: Payer: Self-pay | Admitting: Family

## 2021-12-07 DIAGNOSIS — E1169 Type 2 diabetes mellitus with other specified complication: Secondary | ICD-10-CM

## 2021-12-09 ENCOUNTER — Ambulatory Visit: Payer: BC Managed Care – PPO | Admitting: Family

## 2021-12-14 ENCOUNTER — Ambulatory Visit: Payer: BC Managed Care – PPO | Admitting: Family

## 2021-12-16 ENCOUNTER — Ambulatory Visit (INDEPENDENT_AMBULATORY_CARE_PROVIDER_SITE_OTHER): Payer: BC Managed Care – PPO | Admitting: Family

## 2021-12-16 ENCOUNTER — Encounter: Payer: Self-pay | Admitting: Family

## 2021-12-16 VITALS — BP 98/68 | HR 87 | Temp 98.3°F | Ht 64.0 in | Wt 206.2 lb

## 2021-12-16 DIAGNOSIS — I152 Hypertension secondary to endocrine disorders: Secondary | ICD-10-CM

## 2021-12-16 DIAGNOSIS — E1159 Type 2 diabetes mellitus with other circulatory complications: Secondary | ICD-10-CM | POA: Diagnosis not present

## 2021-12-16 DIAGNOSIS — E669 Obesity, unspecified: Secondary | ICD-10-CM | POA: Diagnosis not present

## 2021-12-16 DIAGNOSIS — E1169 Type 2 diabetes mellitus with other specified complication: Secondary | ICD-10-CM | POA: Diagnosis not present

## 2021-12-16 MED ORDER — METFORMIN HCL ER 750 MG PO TB24: 750.0000 mg | ORAL_TABLET | Freq: Two times a day (BID) | ORAL | 1 refills | Status: DC

## 2021-12-16 NOTE — Patient Instructions (Addendum)
It was very nice to see you today!  Go to the lab for blood work today.  Start taking 1 and half pills of your metformin twice a day, let us know when you are running low and we will send a higher dose to your pharmacy. Also let me know if you are still doing well after another month of Trulicity at 0.75 mg, and we will go ahead and send the 1.5 mg dose to start. Okay to take 1/2 pill of the lisinopril at night if you are feeling dizzy during the day.  Your blood pressure is slightly low today.  Follow-up in 2 months.   PLEASE NOTE:  If you had any lab tests please let us know if you have not heard back within a few days. You may see your results on MyChart before we have a chance to review them but we will give you a call once they are reviewed by [place]. If we ordered any referrals today, please let us know if you have not heard from their office within the next week.

## 2021-12-16 NOTE — Assessment & Plan Note (Addendum)
Chronic - f/u 2 mos - doing better on Trulicity, but will stay on current dose another month, advised pt to let me know if still doing ok then will send 1.5mg  to pharmacy. CBGs at home still high, advised to increase Metformin to 1.5 pills bid = 750mg . rechecking A1C today.

## 2021-12-16 NOTE — Progress Notes (Signed)
Subjective:     Patient ID: Shelby Rubio, female    DOB: [birth year], 21 y.o.   MRN: [id number]  Chief Complaint  Patient presents with   Hypertension   Diabetes   HPI: T2DM: Pt is currently maintained on the following medications for diabetes: Metformin, Trulicity for 3 weeks, caused stomach cramping & tried to switch meds, but none covered. Denies polyuria/polydipsia/polyphagia Denies hypoglycemia Home glucose readings range: 190-210 fasting, postprandial 260-290. Hypertension: Patient is currently maintained on the following medications for blood pressure: Lisinopril. Failed meds include: none. Patient reports good compliance with blood pressure medications. Patient denies chest pain, headaches, shortness of breath or swelling. Last 3 blood pressure readings in our office are as follows: BP Readings from Last 3 Encounters:  12/16/21 98/68  10/07/21 111/77  09/09/21 114/80   Assessment & Plan:   Problem List Items Addressed This Visit       Cardiovascular and Mediastinum   Hypertension associated with diabetes ([place])    chronic - BP low today, denies sx, will monitor, but adivsed ok to cut Lisinopril in half if dizzy during the day. checking CMP today.       Relevant Medications   metFORMIN (GLUCOPHAGE-XR) 750 MG 24 hr tablet     Endocrine   Type 2 diabetes mellitus with obesity ([place]) - Primary    Chronic - f/u 2 mos - doing better on Trulicity, but will stay on current dose another month, advised pt to let me know if still doing ok then will send 1.1m to pharmacy. CBGs at home still high, advised to increase Metformin to 1.5 pills bid = 7564m rechecking A1C today.       Relevant Medications   metFORMIN (GLUCOPHAGE-XR) 750 MG 24 hr tablet   Other Relevant Orders   HgB A1c   Comp Met (CMET)    Outpatient Medications Prior to Visit  Medication Sig Dispense Refill   levonorgestrel (MIRENA) 20 MCG/DAY IUD 1 each by Intrauterine route once.     lisinopril (ZESTRIL)  5 MG tablet Take 1 tablet (5 mg total) by mouth daily. 90 tablet 1   TRULICITY 0.2.33GID/5.6YSOPN INJECT 0.75 MG SUBCUTANEOUSLY ONE TIME PER WEEK 2 mL 2   metFORMIN (GLUCOPHAGE XR) 500 MG 24 hr tablet Take 1 tablet (500 mg total) by mouth 2 (two) times daily after a meal. 180 tablet 1   No facility-administered medications prior to visit.    Past Medical History:  Diagnosis Date   Abnormal menses 06/06/2018   Anxiety 2011?   When diagnosed depressed   Asthma    When i was little   Behavior disorder    Dizziness 03/01/2019   History of hypoglycemia 03/01/2019   Lower abdominal pain 06/06/2018    Past Surgical History:  Procedure Laterality Date   ADENOIDECTOMY     TONSILLECTOMY     tubes in ears      Allergies  Allergen Reactions   Penicillins Hives   Amoxil [Amoxicillin] Hives       Objective:    Physical Exam Vitals and nursing note reviewed.  Constitutional:      Appearance: Normal appearance.  Cardiovascular:     Rate and Rhythm: Normal rate and regular rhythm.  Pulmonary:     Effort: Pulmonary effort is normal.     Breath sounds: Normal breath sounds.  Musculoskeletal:        General: Normal range of motion.  Skin:    General: Skin is warm and dry.  Neurological:     Mental Status: She is alert.  Psychiatric:        Mood and Affect: Mood normal.        Behavior: Behavior normal.    BP 98/68 (BP Location: Left Arm, Patient Position: Sitting, Cuff Size: Large)   Pulse 87   Temp 98.3 F (36.8 C) (Temporal)   Ht 5' 4"  (1.626 m)   Wt 206 lb 4 oz (93.6 kg)   LMP 11/21/2021 (Approximate)   SpO2 98%   BMI 35.40 kg/m  Wt Readings from Last 3 Encounters:  12/16/21 206 lb 4 oz (93.6 kg)  11/09/21 208 lb 2 oz (94.4 kg)  10/07/21 212 lb 6.4 oz (96.3 kg)        Meds ordered this encounter  Medications   metFORMIN (GLUCOPHAGE-XR) 750 MG 24 hr tablet    Sig: Take 1 tablet (750 mg total) by mouth 2 (two) times daily after a meal.    Dispense:  180 tablet     Refill:  1    Order Specific Question:   Supervising Provider    Answer:   ANDY, CAMILLE L [[id number]]    Jeanie Sewer, NP

## 2021-12-16 NOTE — Assessment & Plan Note (Addendum)
chronic - BP low today, denies sx, will monitor, but adivsed ok to cut Lisinopril in half if dizzy during the day. checking CMP today.

## 2021-12-17 ENCOUNTER — Encounter: Payer: Self-pay | Admitting: Family

## 2021-12-17 LAB — COMPREHENSIVE METABOLIC PANEL
ALT: 14 U/L (ref 0–35)
AST: 13 U/L (ref 0–37)
Albumin: 4.6 g/dL (ref 3.5–5.2)
Alkaline Phosphatase: 89 U/L (ref 39–117)
BUN: 10 mg/dL (ref 6–23)
CO2: 27 mEq/L (ref 19–32)
Calcium: 9.7 mg/dL (ref 8.4–10.5)
Chloride: 102 mEq/L (ref 96–112)
Creatinine, Ser: 0.88 mg/dL (ref 0.40–1.20)
GFR: 93.05 mL/min (ref >60.00)
Glucose, Bld: 339 mg/dL — ABNORMAL HIGH (ref 70–99)
Potassium: 4.2 mEq/L (ref 3.5–5.1)
Sodium: 137 mEq/L (ref 135–145)
Total Bilirubin: 0.3 mg/dL (ref 0.2–1.2)
Total Protein: 7.2 g/dL (ref 6.0–8.3)

## 2021-12-17 LAB — HEMOGLOBIN A1C: Hgb A1c MFr Bld: 9.2 % — ABNORMAL HIGH (ref 4.6–6.5)

## 2021-12-23 ENCOUNTER — Other Ambulatory Visit: Payer: Self-pay | Admitting: Family

## 2021-12-23 DIAGNOSIS — E1169 Type 2 diabetes mellitus with other specified complication: Secondary | ICD-10-CM

## 2021-12-23 MED ORDER — FLUCONAZOLE 150 MG PO TABS: 150.0000 mg | ORAL_TABLET | ORAL | 0 refills | Status: DC | PRN

## 2021-12-23 MED ORDER — GLIPIZIDE ER 2.5 MG PO TB24: 2.5000 mg | ORAL_TABLET | Freq: Two times a day (BID) | ORAL | 2 refills | Status: DC

## 2021-12-23 NOTE — Progress Notes (Signed)
Hi Shelby Rubio,  So your sugar is still high and your A1C is a little higher. Hopefully increasing the Metformin will help, but I'm also sending Glipizide to your pharmacy which will also help. Take this twice a day with the Metformin. Eating after taking the both meds, especially the Glipizide will be very important to not drop your blood sugar too low, let me know if any concerns.

## 2021-12-30 DIAGNOSIS — Z7984 Long term (current) use of oral hypoglycemic drugs: Secondary | ICD-10-CM | POA: Diagnosis not present

## 2021-12-30 DIAGNOSIS — E119 Type 2 diabetes mellitus without complications: Secondary | ICD-10-CM | POA: Diagnosis not present

## 2021-12-30 DIAGNOSIS — H5213 Myopia, bilateral: Secondary | ICD-10-CM | POA: Diagnosis not present

## 2021-12-30 LAB — HM DIABETES EYE EXAM

## 2022-02-19 ENCOUNTER — Ambulatory Visit
Admission: RE | Admit: 2022-02-19 | Discharge: 2022-02-19 | Disposition: A | Discharge status: Home / Self Care | Payer: BC Managed Care – PPO | Source: Ambulatory Visit | Attending: Nurse Practitioner | Admitting: Nurse Practitioner

## 2022-02-19 VITALS — BP 138/85 | HR 83 | Temp 97.8°F | Resp 18

## 2022-02-19 DIAGNOSIS — H66001 Acute suppurative otitis media without spontaneous rupture of ear drum, right ear: Secondary | ICD-10-CM

## 2022-02-19 DIAGNOSIS — J069 Acute upper respiratory infection, unspecified: Secondary | ICD-10-CM | POA: Diagnosis not present

## 2022-02-19 MED ORDER — PSEUDOEPH-BROMPHEN-DM 30-2-10 MG/5ML PO SYRP
5.0000 mL | ORAL_SOLUTION | Freq: Four times a day (QID) | ORAL | 0 refills | Status: DC | PRN
Start: 2022-02-19 — End: 2022-03-28

## 2022-02-19 MED ORDER — AZITHROMYCIN 250 MG PO TABS: 250.0000 mg | ORAL_TABLET | Freq: Every day | ORAL | 0 refills | Status: DC

## 2022-02-19 MED ORDER — FLUTICASONE PROPIONATE 50 MCG/ACT NA SUSP: 2.0000 | Freq: Every day | NASAL | 0 refills | Status: DC

## 2022-02-19 MED ORDER — CETIRIZINE HCL 10 MG PO TABS: 10.0000 mg | ORAL_TABLET | Freq: Every day | ORAL | 0 refills | Status: DC

## 2022-02-19 NOTE — ED Provider Notes (Signed)
[facility]    CSN: [id number] Arrival date & time: 02/19/22  0959      History   Chief Complaint Chief Complaint  Patient presents with   Cough    Entered by patient    HPI Shelby Rubio is a 21 y.o. female.   The history is provided by the patient.   Patient presents for upper respiratory symptoms that been present for the past 2 days.  Patient complains of chills, sore throat, nasal congestion, cough, and runny nose.  She also complains of bilateral ear pain.  Patient denies fever, wheezing, shortness of breath, difficulty breathing, or GI symptoms.  She has been taking Sudafed for her symptoms.  Denies any known sick contacts.  Past Medical History:  Diagnosis Date   Abnormal menses 06/06/2018   Anxiety 2011?   When diagnosed depressed   Asthma    When i was little   Behavior disorder    Diabetes mellitus without complication (HCC)    Dizziness 03/01/2019   History of hypoglycemia 03/01/2019   Lower abdominal pain 06/06/2018    Patient Active Problem List   Diagnosis Date Noted   Type 2 diabetes mellitus with obesity (HCC) 09/09/2021   Hypertension associated with diabetes (HCC) 09/09/2021   Refused influenza vaccine 06/06/2018   Moderate episode of recurrent major depressive disorder (HCC) 06/10/2016    Past Surgical History:  Procedure Laterality Date   ADENOIDECTOMY     TONSILLECTOMY     tubes in ears      OB History     Gravida      Para      Term      Preterm      AB      Living  0      SAB      IAB      Ectopic      Multiple      Live Births               Home Medications    Prior to Admission medications   Medication Sig Start Date End Date Taking? Authorizing Provider  azithromycin (ZITHROMAX) 250 MG tablet Take 1 tablet (250 mg total) by mouth daily. Take first 2 tablets together, then 1 every day until finished. 02/19/22  Yes Jasmane Brockway-Warren, Sadie Haber, NP  brompheniramine-pseudoephedrine-DM 30-2-10 MG/5ML  syrup Take 5 mLs by mouth 4 (four) times daily as needed. 02/19/22  Yes Helvi Royals-Warren, Sadie Haber, NP  cetirizine (ZYRTEC) 10 MG tablet Take 1 tablet (10 mg total) by mouth daily. 02/19/22  Yes Shanina Kepple-Warren, Sadie Haber, NP  fluticasone (FLONASE) 50 MCG/ACT nasal spray Place 2 sprays into both nostrils daily. 02/19/22  Yes Leanna Hamid-Warren, Sadie Haber, NP  fluconazole (DIFLUCAN) 150 MG tablet Take 1 tablet (150 mg total) by mouth every three (3) days as needed. 12/23/21   Dulce Sellar, NP  glipiZIDE (GLUCOTROL XL) 2.5 MG 24 hr tablet Take 1 tablet (2.5 mg total) by mouth 2 (two) times daily with a meal. 12/23/21   Dulce Sellar, NP  levonorgestrel (MIRENA) 20 MCG/DAY IUD 1 each by Intrauterine route once.    [provider]  lisinopril (ZESTRIL) 5 MG tablet Take 1 tablet (5 mg total) by mouth daily. 11/09/21   Dulce Sellar, NP  metFORMIN (GLUCOPHAGE-XR) 750 MG 24 hr tablet Take 1 tablet (750 mg total) by mouth 2 (two) times daily after a meal. 12/16/21   Hudnell, Stephanie, NP  TRULICITY 0.75 MG/0.5ML SOPN INJECT 0.75 MG SUBCUTANEOUSLY  ONE TIME PER WEEK 12/07/21   Dulce Sellar, NP    Family History Family History  Problem Relation Age of Onset   Diabetes Mother    Hypertension Mother    Obesity Mother    Asthma Sister    Asthma Brother    Diabetes Other    ADD / ADHD Father    Alcohol abuse Father    Anxiety disorder Father    Arthritis Father    Depression Father    Stroke Father    Arthritis Maternal Grandfather    Asthma Maternal Grandfather    Cancer Maternal Grandfather    Diabetes Maternal Grandfather    Hearing loss Maternal Grandfather    Kidney disease Maternal Grandfather    Arthritis Maternal Grandmother    Miscarriages / Stillbirths Maternal Grandmother    ADD / ADHD Paternal Grandfather    Anxiety disorder Paternal Grandfather    Arthritis Paternal Grandfather    Depression Paternal Grandfather    Diabetes Paternal Grandfather    Early death  Paternal Grandfather    Heart disease Paternal Grandfather    Anxiety disorder Paternal Grandmother    Depression Paternal Grandmother    Diabetes Paternal Grandmother    Obesity Paternal Grandmother    ADD / ADHD Brother    Anxiety disorder Brother    Asthma Brother    Depression Brother    ADD / ADHD Brother    ADD / ADHD Paternal Uncle    Anxiety disorder Paternal Uncle    Arthritis Paternal Uncle    Asthma Paternal Uncle    Depression Paternal Uncle    Diabetes Paternal Uncle    Hypertension Paternal Uncle    Obesity Paternal Uncle    ADD / ADHD Paternal Uncle    Anxiety disorder Paternal Uncle    Arthritis Paternal Uncle    Asthma Paternal Uncle    Depression Paternal Uncle    Diabetes Paternal Uncle    Hypertension Paternal Uncle    Obesity Paternal Uncle    Anxiety disorder Sister    Asthma Sister    Depression Sister    Miscarriages / Stillbirths Sister    Anxiety disorder Paternal Uncle    Depression Paternal Uncle    Diabetes Paternal Uncle    Learning disabilities Paternal Uncle    Obesity Paternal Uncle    Diabetes Paternal Uncle    Kidney disease Paternal Uncle    Obesity Paternal Uncle    Miscarriages / Stillbirths Maternal Aunt    Obesity Maternal Aunt     Social History Social History   Tobacco Use   Smoking status: Every Day    Packs/day: 1.00    Years: 4.00    Total pack years: 4.00    Types: Cigarettes   Smokeless tobacco: Never  Vaping Use   Vaping Use: Never used  Substance Use Topics   Alcohol use: No   Drug use: Never     Allergies   Penicillins and Amoxil [amoxicillin]   Review of Systems Review of Systems Per HPI  Physical Exam Triage Vital Signs ED Triage Vitals [02/19/22 1022]  Enc Vitals Group     BP 138/85     Pulse Rate 83     Resp 18     Temp 97.8 F (36.6 C)     Temp Source Oral     SpO2 98 %     Weight      Height      Head Circumference  Peak Flow      Pain Score 4     Pain Loc      Pain Edu?       Excl. in GC?    No data found.  Updated Vital Signs BP 138/85 (BP Location: Right Arm)   Pulse 83   Temp 97.8 F (36.6 C) (Oral)   Resp 18   LMP 02/17/2022 (Exact Date)   SpO2 98%   Visual Acuity Right Eye Distance:   Left Eye Distance:   Bilateral Distance:    Right Eye Near:   Left Eye Near:    Bilateral Near:     Physical Exam Vitals and nursing note reviewed.  Constitutional:      General: She is not in acute distress.    Appearance: Normal appearance.  HENT:     Head: Normocephalic.     Right Ear: Ear canal and external ear normal. Tympanic membrane is erythematous and bulging.     Left Ear: Ear canal and external ear normal. Tympanic membrane is not erythematous or bulging.     Nose: Congestion and rhinorrhea present. Rhinorrhea is clear.     Right Turbinates: Enlarged and swollen.     Left Turbinates: Enlarged and swollen.     Right Sinus: No maxillary sinus tenderness or frontal sinus tenderness.     Left Sinus: No maxillary sinus tenderness or frontal sinus tenderness.     Mouth/Throat:     Lips: Pink.     Mouth: Mucous membranes are moist.     Pharynx: Pharyngeal swelling and posterior oropharyngeal erythema present. No oropharyngeal exudate.     Tonsils: 1+ on the right. 1+ on the left.  Eyes:     Extraocular Movements: Extraocular movements intact.     Conjunctiva/sclera: Conjunctivae normal.     Pupils: Pupils are equal, round, and reactive to light.  Cardiovascular:     Rate and Rhythm: Normal rate and regular rhythm.     Pulses: Normal pulses.     Heart sounds: Normal heart sounds.  Pulmonary:     Effort: Pulmonary effort is normal. No respiratory distress.     Breath sounds: Normal breath sounds. No stridor. No wheezing, rhonchi or rales.  Abdominal:     General: Bowel sounds are normal.     Palpations: Abdomen is soft.     Tenderness: There is no abdominal tenderness.  Musculoskeletal:     Cervical back: Normal range of motion.   Lymphadenopathy:     Cervical: No cervical adenopathy.  Skin:    General: Skin is warm and dry.  Neurological:     General: No focal deficit present.     Mental Status: She is alert and oriented to person, place, and time.  Psychiatric:        Mood and Affect: Mood normal.        Behavior: Behavior normal.      UC Treatments / Results  Labs (all labs ordered are listed, but only abnormal results are displayed) Labs Reviewed - No data to display  EKG   Radiology No results found.  Procedures Procedures (including critical care time)  Medications Ordered in UC Medications - No data to display  Initial Impression / Assessment and Plan / UC Course  I have reviewed the triage vital signs and the nursing notes.  Pertinent labs & imaging results that were available during my care of the patient were reviewed by me and considered in my medical decision making (see chart for details).  Patient presents for upper respiratory symptoms that been present for the past 2 days.  On exam, patient has bulging and erythema of the right tympanic membrane.  Exam is otherwise benign, her vital signs are stable.  Her other upper respiratory symptoms are most likely related to a viral upper respiratory infection.  Patient will be covered with azithromycin which should cover any other possible bacterial etiology.  Will also provide patient with Bromfed, fluticasone, and cetirizine for her symptoms.  Supportive care recommendations were provided to the patient.  Patient advised to follow-up as needed. Final Clinical Impressions(s) / UC Diagnoses   Final diagnoses:  Acute suppurative otitis media of right ear without spontaneous rupture of tympanic membrane, recurrence not specified  Viral upper respiratory infection     Discharge Instructions      Take medication as prescribed. Increase fluids and allow for plenty of rest. Recommend Tylenol or ibuprofen as needed for pain, fever, or general  discomfort. Warm salt water gargles 3-4 times daily to help with throat pain or discomfort. Recommend using a humidifier at bedtime during sleep to help with cough and nasal congestion. Sleep elevated on 2 pillows while symptoms persist. Follow-up if your symptoms do not improve.      ED Prescriptions     Medication Sig Dispense Auth. Provider   azithromycin (ZITHROMAX) 250 MG tablet Take 1 tablet (250 mg total) by mouth daily. Take first 2 tablets together, then 1 every day until finished. 6 tablet Jamecia Lerman-Warren, Sadie Haber, NP   brompheniramine-pseudoephedrine-DM 30-2-10 MG/5ML syrup Take 5 mLs by mouth 4 (four) times daily as needed. 140 mL Trevonn Hallum-Warren, Sadie Haber, NP   fluticasone (FLONASE) 50 MCG/ACT nasal spray Place 2 sprays into both nostrils daily. 16 g Mariesa Grieder-Warren, Sadie Haber, NP   cetirizine (ZYRTEC) 10 MG tablet Take 1 tablet (10 mg total) by mouth daily. 30 tablet Mikolaj Woolstenhulme-Warren, Sadie Haber, NP      PDMP not reviewed this encounter.   Abran Cantor, NP 02/19/22 1103

## 2022-02-19 NOTE — Discharge Instructions (Signed)
Take medication as prescribed. Increase fluids and allow for plenty of rest. Recommend Tylenol or ibuprofen as needed for pain, fever, or general discomfort. Warm salt water gargles 3-4 times daily to help with throat pain or discomfort. Recommend using a humidifier at bedtime during sleep to help with cough and nasal congestion. Sleep elevated on 2 pillows while symptoms persist. Follow-up if your symptoms do not improve.

## 2022-02-19 NOTE — ED Triage Notes (Signed)
Bilateral ear pain x 2 days.  Nasal congestion and some coughing.  Has been taking sudafed with some relief.

## 2022-02-22 ENCOUNTER — Encounter: Payer: Self-pay | Admitting: Family

## 2022-02-22 ENCOUNTER — Ambulatory Visit (INDEPENDENT_AMBULATORY_CARE_PROVIDER_SITE_OTHER): Payer: BC Managed Care – PPO | Admitting: Family

## 2022-02-22 VITALS — BP 102/72 | HR 90 | Temp 98.2°F | Ht 64.0 in | Wt 184.6 lb

## 2022-02-22 DIAGNOSIS — H65113 Acute and subacute allergic otitis media (mucoid) (sanguinous) (serous), bilateral: Secondary | ICD-10-CM

## 2022-02-22 DIAGNOSIS — E669 Obesity, unspecified: Secondary | ICD-10-CM | POA: Diagnosis not present

## 2022-02-22 DIAGNOSIS — E1169 Type 2 diabetes mellitus with other specified complication: Secondary | ICD-10-CM

## 2022-02-22 MED ORDER — GLIPIZIDE ER 2.5 MG PO TB24: 2.5000 mg | ORAL_TABLET | Freq: Two times a day (BID) | ORAL | 2 refills | Status: DC

## 2022-02-22 MED ORDER — DOXYCYCLINE HYCLATE 100 MG PO TABS: 100.0000 mg | ORAL_TABLET | Freq: Two times a day (BID) | ORAL | 0 refills | Status: AC

## 2022-02-22 MED ORDER — BLOOD GLUCOSE MONITOR KIT: PACK | 0 refills | Status: DC

## 2022-02-22 NOTE — Progress Notes (Signed)
Patient ID: Shelby Rubio, female    DOB: [birth year], 21 y.o.   MRN: [id number]  Chief Complaint  Patient presents with   Diabetes   URI    Pt states she was diagnosed with a respiratory and ear infection of right ear but left ear is giving her problems too. Left is full and she cant hear of it and drainage of blood/clear. Urgent care in [place].     HPI: Ear pain:  pt seen in UC 3 days ago for URI, and told her right ear was also infected and given a zpack. She has been instilling OTC Lidocaine drops in the ear to help with the pain. Today she reports continued sx, with pain in both ears and left ear with fullness and decreased hearing. T2DM: Pt is currently maintained on the following medications for diabetes: Metformin, Trulicity for 3 weeks, caused stomach cramping & tried to switch meds, but none covered. Denies polyuria/polydipsia/polyphagia Denies hypoglycemia Home glucose readings range: pt has not checked CBGs due to cost of strips - has been getting OTC  Assessment & Plan:   Problem List Items Addressed This Visit       Endocrine   Type 2 diabetes mellitus with obesity ([place]) - Primary    chronic stopped Trulicity due to never getting over nausea even on lowest dose taking Glipizide 2.27m bid & Metformin 7584mbid hasn't been checking CBGs at home due to cost of strips has managed to lose 22lbs on her own! sending glucometer to be filled under pt insurance coverage continue same doses of metformin & glipizide, refill sent f/u in 2 months - recheck A1C      Relevant Medications   blood glucose meter kit and supplies KIT   glipiZIDE (GLUCOTROL XL) 2.5 MG 24 hr tablet   Other Visit Diagnoses     Acute mucoid otitis media of both ears    - Verbal consent received to perform left ear lavage via Hydrogen peroxide/water mix solution. Pt tolerated well, only partial evacuation of cerumen obtained. Mild erythema & mild bleeding noted in ear canal after procedure. Both TM  erythematous w/mucoid effusion, starting DOXY x 7d, advised on use & SE,  go ahead and finish the Zpack. Advised to take up to [id number] Ibuprofen tid after eating for ear pain, can alternate with 2 extra strength Tylenol.  Return after DOXY finished to recheck ears.     Relevant Medications   doxycycline (VIBRA-TABS) 100 MG tablet   Other Relevant Orders   Ear Lavage      Subjective:    Outpatient Medications Prior to Visit  Medication Sig Dispense Refill   azithromycin (ZITHROMAX) 250 MG tablet Take 1 tablet (250 mg total) by mouth daily. Take first 2 tablets together, then 1 every day until finished. 6 tablet 0   brompheniramine-pseudoephedrine-DM 30-2-10 MG/5ML syrup Take 5 mLs by mouth 4 (four) times daily as needed. 140 mL 0   cetirizine (ZYRTEC) 10 MG tablet Take 1 tablet (10 mg total) by mouth daily. 30 tablet 0   fluticasone (FLONASE) 50 MCG/ACT nasal spray Place 2 sprays into both nostrils daily. 16 g 0   levonorgestrel (MIRENA) 20 MCG/DAY IUD 1 each by Intrauterine route once.     lisinopril (ZESTRIL) 5 MG tablet Take 1 tablet (5 mg total) by mouth daily. 90 tablet 1   metFORMIN (GLUCOPHAGE-XR) 750 MG 24 hr tablet Take 1 tablet (750 mg total) by mouth 2 (two) times daily after a meal. 180  tablet 1   fluconazole (DIFLUCAN) 150 MG tablet Take 1 tablet (150 mg total) by mouth every three (3) days as needed. 2 tablet 0   glipiZIDE (GLUCOTROL XL) 2.5 MG 24 hr tablet Take 1 tablet (2.5 mg total) by mouth 2 (two) times daily with a meal. 60 tablet 2   TRULICITY 9.89 QJ/1.9ER SOPN INJECT 0.75 MG SUBCUTANEOUSLY ONE TIME PER WEEK 2 mL 2   No facility-administered medications prior to visit.   Past Medical History:  Diagnosis Date   Abnormal menses 06/06/2018   Anxiety 2011?   When diagnosed depressed   Asthma    When i was little   Behavior disorder    Diabetes mellitus without complication ([place])    Dizziness 03/01/2019   History of hypoglycemia 03/01/2019   Lower abdominal pain  06/06/2018   Refused influenza vaccine 06/06/2018   Past Surgical History:  Procedure Laterality Date   ADENOIDECTOMY     TONSILLECTOMY     tubes in ears     Allergies  Allergen Reactions   Penicillins Hives   Amoxil [Amoxicillin] Hives      Objective:    Physical Exam Vitals and nursing note reviewed.  Constitutional:      Appearance: Normal appearance.  HENT:     Right Ear: Tenderness present. A middle ear effusion is present. Tympanic membrane is erythematous.     Left Ear: Decreased hearing noted. Drainage (raw, open, erythematous skin noted in canal), swelling and tenderness present. A middle ear effusion is present.  Cardiovascular:     Rate and Rhythm: Normal rate and regular rhythm.  Pulmonary:     Effort: Pulmonary effort is normal.     Breath sounds: Normal breath sounds.  Musculoskeletal:        General: Normal range of motion.  Lymphadenopathy:     Head:     Right side of head: No preauricular or posterior auricular adenopathy.     Left side of head: No preauricular or posterior auricular adenopathy.     Cervical: No cervical adenopathy.  Skin:    General: Skin is warm and dry.  Neurological:     Mental Status: She is alert.  Psychiatric:        Mood and Affect: Mood normal.        Behavior: Behavior normal.    BP 102/72 (BP Location: Left Arm, Patient Position: Sitting, Cuff Size: Large)   Pulse 90   Temp 98.2 F (36.8 C) (Temporal)   Ht _0  (1.626 m)   Wt 184 lb 9.6 oz (83.7 kg)   LMP 02/17/2022 (Exact Date)   SpO2 98%   BMI 31.69 kg/m  Wt Readings from Last 3 Encounters:  02/22/22 184 lb 9.6 oz (83.7 kg)  12/16/21 206 lb 4 oz (93.6 kg)  11/09/21 208 lb 2 oz (94.4 kg)       Jeanie Sewer, NP

## 2022-02-22 NOTE — Patient Instructions (Addendum)
It was very nice to see you today!   Finish your Zpack and I have sent over Doxycycline to your pharmacy to start taking also, start today for your ear infection. Come in after you finish the antibiotic - nurse visit so I can recheck your ear.  Continue the Glipizide and Metformin twice a day. I have sent over a glucometer for you to check your fasting blood sugar in the mornings, and you can randomly check a reading before supper.  Great job losing 22lbs all on you own! That is awesome, keep it up!!  2 months follow up - recheck A1C   PLEASE NOTE:  If you had any lab tests please let us know if you have not heard back within a few days. You may see your results on MyChart before we have a chance to review them but we will give you a call once they are reviewed by [place]. If we ordered any referrals today, please let us know if you have not heard from their office within the next week.

## 2022-02-22 NOTE — Assessment & Plan Note (Signed)
   chronic  stopped Trulicity due to never getting over nausea even on lowest dose  taking Glipizide 2.5mg  bid & Metformin 750mg  bid  hasn't been checking CBGs at home due to cost of strips  has managed to lose 22lbs on her own!  sending glucometer to be filled under pt insurance coverage  continue same doses of metformin & glipizide, refill sent  f/u in 2 months - recheck A1C

## 2022-03-08 ENCOUNTER — Ambulatory Visit: Payer: BC Managed Care – PPO | Admitting: Family

## 2022-03-08 ENCOUNTER — Ambulatory Visit: Payer: BC Managed Care – PPO

## 2022-03-08 NOTE — Progress Notes (Deleted)
Patient ID: Shelby Rubio, female    DOB: 01/12/2001, 21 y.o.   MRN: [id number]  No chief complaint on file.   HPI: Ear pain:  pt seen in UC 3 days ago for URI, and told her right ear was also infected and given a zpack. She has been instilling OTC Lidocaine drops in the ear to help with the pain. Today she reports continued sx, with pain in both ears and left ear with fullness and decreased hearing.  Assessment & Plan:   Problem List Items Addressed This Visit   None   Subjective:    Outpatient Medications Prior to Visit  Medication Sig Dispense Refill  . azithromycin (ZITHROMAX) 250 MG tablet Take 1 tablet (250 mg total) by mouth daily. Take first 2 tablets together, then 1 every day until finished. 6 tablet 0  . blood glucose meter kit and supplies KIT Dispense based on patient and insurance preference. Use up to 2 times daily as directed. 1 each 0  . brompheniramine-pseudoephedrine-DM 30-2-10 MG/5ML syrup Take 5 mLs by mouth 4 (four) times daily as needed. 140 mL 0  . cetirizine (ZYRTEC) 10 MG tablet Take 1 tablet (10 mg total) by mouth daily. 30 tablet 0  . fluticasone (FLONASE) 50 MCG/ACT nasal spray Place 2 sprays into both nostrils daily. 16 g 0  . glipiZIDE (GLUCOTROL XL) 2.5 MG 24 hr tablet Take 1 tablet (2.5 mg total) by mouth 2 (two) times daily with a meal. 60 tablet 2  . levonorgestrel (MIRENA) 20 MCG/DAY IUD 1 each by Intrauterine route once.    . lisinopril (ZESTRIL) 5 MG tablet Take 1 tablet (5 mg total) by mouth daily. 90 tablet 1  . metFORMIN (GLUCOPHAGE-XR) 750 MG 24 hr tablet Take 1 tablet (750 mg total) by mouth 2 (two) times daily after a meal. 180 tablet 1   No facility-administered medications prior to visit.   Past Medical History:  Diagnosis Date  . Abnormal menses 06/06/2018  . Anxiety 2011?   When diagnosed depressed  . Asthma    When i was little  . Behavior disorder   . Diabetes mellitus without complication (HCC)   . Dizziness 03/01/2019  .  History of hypoglycemia 03/01/2019  . Lower abdominal pain 06/06/2018  . Refused influenza vaccine 06/06/2018   Past Surgical History:  Procedure Laterality Date  . ADENOIDECTOMY    . TONSILLECTOMY    . tubes in ears     Allergies  Allergen Reactions  . Penicillins Hives  . Amoxil [Amoxicillin] Hives      Objective:    Physical Exam Vitals and nursing note reviewed.  Constitutional:      Appearance: Normal appearance.  Cardiovascular:     Rate and Rhythm: Normal rate and regular rhythm.  Pulmonary:     Effort: Pulmonary effort is normal.     Breath sounds: Normal breath sounds.  Musculoskeletal:        General: Normal range of motion.  Skin:    General: Skin is warm and dry.  Neurological:     Mental Status: She is alert.  Psychiatric:        Mood and Affect: Mood normal.        Behavior: Behavior normal.  LMP 02/17/2022 (Exact Date)  Wt Readings from Last 3 Encounters:  02/22/22 184 lb 9.6 oz (83.7 kg)  12/16/21 206 lb 4 oz (93.6 kg)  11/09/21 208 lb 2 oz (94.4 kg)       Hudnell, Stephanie, NP  

## 2022-03-08 NOTE — Progress Notes (Signed)
Patient here to get bilateral ears looked at for [facility]. I checked Both ears, both were clear. Pt stated she is feeling a lot better and no ear pain.

## 2022-03-27 ENCOUNTER — Encounter (HOSPITAL_COMMUNITY): Payer: Self-pay | Admitting: *Deleted

## 2022-03-27 ENCOUNTER — Other Ambulatory Visit: Payer: Self-pay

## 2022-03-27 ENCOUNTER — Emergency Department (HOSPITAL_COMMUNITY): Payer: BC Managed Care – PPO

## 2022-03-27 ENCOUNTER — Observation Stay (HOSPITAL_COMMUNITY)
Admission: EM | Admit: 2022-03-27 | Discharge: 2022-03-28 | Disposition: A | Discharge status: Home / Self Care | Payer: BC Managed Care – PPO | Attending: Family Medicine | Admitting: Family Medicine

## 2022-03-27 DIAGNOSIS — Z794 Long term (current) use of insulin: Secondary | ICD-10-CM | POA: Diagnosis present

## 2022-03-27 DIAGNOSIS — E1165 Type 2 diabetes mellitus with hyperglycemia: Secondary | ICD-10-CM | POA: Diagnosis not present

## 2022-03-27 DIAGNOSIS — E1169 Type 2 diabetes mellitus with other specified complication: Secondary | ICD-10-CM | POA: Diagnosis present

## 2022-03-27 DIAGNOSIS — F17210 Nicotine dependence, cigarettes, uncomplicated: Secondary | ICD-10-CM | POA: Insufficient documentation

## 2022-03-27 DIAGNOSIS — E870 Hyperosmolality and hypernatremia: Secondary | ICD-10-CM | POA: Diagnosis not present

## 2022-03-27 DIAGNOSIS — I10 Essential (primary) hypertension: Secondary | ICD-10-CM | POA: Insufficient documentation

## 2022-03-27 DIAGNOSIS — R42 Dizziness and giddiness: Secondary | ICD-10-CM | POA: Diagnosis not present

## 2022-03-27 DIAGNOSIS — Z7985 Long-term (current) use of injectable non-insulin antidiabetic drugs: Secondary | ICD-10-CM | POA: Diagnosis not present

## 2022-03-27 DIAGNOSIS — Z7984 Long term (current) use of oral hypoglycemic drugs: Secondary | ICD-10-CM | POA: Diagnosis not present

## 2022-03-27 DIAGNOSIS — E1100 Type 2 diabetes mellitus with hyperosmolarity without nonketotic hyperglycemic-hyperosmolar coma (NKHHC): Secondary | ICD-10-CM | POA: Diagnosis not present

## 2022-03-27 DIAGNOSIS — J45909 Unspecified asthma, uncomplicated: Secondary | ICD-10-CM | POA: Diagnosis not present

## 2022-03-27 DIAGNOSIS — Z79899 Other long term (current) drug therapy: Secondary | ICD-10-CM | POA: Diagnosis not present

## 2022-03-27 DIAGNOSIS — I152 Hypertension secondary to endocrine disorders: Secondary | ICD-10-CM | POA: Diagnosis present

## 2022-03-27 DIAGNOSIS — Z720 Tobacco use: Secondary | ICD-10-CM | POA: Diagnosis present

## 2022-03-27 DIAGNOSIS — R110 Nausea: Secondary | ICD-10-CM | POA: Diagnosis not present

## 2022-03-27 DIAGNOSIS — F331 Major depressive disorder, recurrent, moderate: Secondary | ICD-10-CM | POA: Diagnosis present

## 2022-03-27 DIAGNOSIS — R739 Hyperglycemia, unspecified: Secondary | ICD-10-CM | POA: Diagnosis not present

## 2022-03-27 DIAGNOSIS — Z20822 Contact with and (suspected) exposure to covid-19: Secondary | ICD-10-CM | POA: Diagnosis not present

## 2022-03-27 DIAGNOSIS — E1159 Type 2 diabetes mellitus with other circulatory complications: Secondary | ICD-10-CM | POA: Diagnosis present

## 2022-03-27 DIAGNOSIS — E669 Obesity, unspecified: Secondary | ICD-10-CM | POA: Diagnosis present

## 2022-03-27 DIAGNOSIS — F17200 Nicotine dependence, unspecified, uncomplicated: Secondary | ICD-10-CM | POA: Diagnosis present

## 2022-03-27 HISTORY — DX: Type 2 diabetes mellitus with hyperosmolarity without nonketotic hyperglycemic-hyperosmolar coma (NKHHC): E11.00

## 2022-03-27 HISTORY — DX: Obesity, unspecified: E66.9

## 2022-03-27 LAB — CBC WITH DIFFERENTIAL/PLATELET
Abs Immature Granulocytes: 0.02 10*3/uL (ref 0.00–0.07)
Basophils Absolute: 0 10*3/uL (ref 0.0–0.1)
Basophils Relative: 0 %
Eosinophils Absolute: 0.7 10*3/uL — ABNORMAL HIGH (ref 0.0–0.5)
Eosinophils Relative: 9 %
HCT: 43.8 % (ref 36.0–46.0)
Hemoglobin: 15.8 g/dL — ABNORMAL HIGH (ref 12.0–15.0)
Immature Granulocytes: 0 %
Lymphocytes Relative: 23 %
Lymphs Abs: 1.9 10*3/uL (ref 0.7–4.0)
MCH: 31.7 pg (ref 26.0–34.0)
MCHC: 36.1 g/dL — ABNORMAL HIGH (ref 30.0–36.0)
MCV: 87.8 fL (ref 80.0–100.0)
Monocytes Absolute: 0.5 10*3/uL (ref 0.1–1.0)
Monocytes Relative: 6 %
Neutro Abs: 5.1 10*3/uL (ref 1.7–7.7)
Neutrophils Relative %: 62 %
Platelets: 315 10*3/uL (ref 150–400)
RBC: 4.99 MIL/uL (ref 3.87–5.11)
RDW: 12.2 % (ref 11.5–15.5)
WBC: 8.2 10*3/uL (ref 4.0–10.5)
nRBC: 0 % (ref 0.0–0.2)

## 2022-03-27 LAB — URINALYSIS, ROUTINE W REFLEX MICROSCOPIC
Bilirubin Urine: NEGATIVE
Glucose, UA: >=500 mg/dL — AB
Ketones, ur: 5 mg/dL — AB
Leukocytes,Ua: NEGATIVE
Nitrite: NEGATIVE
Protein, ur: NEGATIVE mg/dL
Specific Gravity, Urine: 1.027 (ref 1.005–1.030)
pH: 6 (ref 5.0–8.0)

## 2022-03-27 LAB — BLOOD GAS, VENOUS
Acid-Base Excess: 3.1 mmol/L — ABNORMAL HIGH (ref 0.0–2.0)
Bicarbonate: 27 mmol/L (ref 20.0–28.0)
Drawn by: 6051
O2 Saturation: 88.9 %
Patient temperature: 37
pCO2, Ven: 38 mmHg — ABNORMAL LOW (ref 44–60)
pH, Ven: 7.46 — ABNORMAL HIGH (ref 7.25–7.43)
pO2, Ven: 51 mmHg — ABNORMAL HIGH (ref 32–45)

## 2022-03-27 LAB — COMPREHENSIVE METABOLIC PANEL
ALT: 17 U/L (ref 0–44)
AST: 11 U/L — ABNORMAL LOW (ref 15–41)
Albumin: 4.7 g/dL (ref 3.5–5.0)
Alkaline Phosphatase: 121 U/L (ref 38–126)
Anion gap: 12 (ref 5–15)
BUN: 14 mg/dL (ref 6–20)
CO2: 23 mmol/L (ref 22–32)
Calcium: 9.8 mg/dL (ref 8.9–10.3)
Chloride: 96 mmol/L — ABNORMAL LOW (ref 98–111)
Creatinine, Ser: 0.63 mg/dL (ref 0.44–1.00)
GFR, Estimated: >60 mL/min (ref >60)
Glucose, Bld: >1200 mg/dL (ref 70–99)
Potassium: 3.9 mmol/L (ref 3.5–5.1)
Sodium: 131 mmol/L — ABNORMAL LOW (ref 135–145)
Total Bilirubin: 0.8 mg/dL (ref 0.3–1.2)
Total Protein: 7.9 g/dL (ref 6.5–8.1)

## 2022-03-27 LAB — OSMOLALITY: Osmolality: 326 mOsm/kg (ref 275–295)

## 2022-03-27 LAB — PREGNANCY, URINE: Preg Test, Ur: NEGATIVE

## 2022-03-27 LAB — SARS CORONAVIRUS 2 BY RT PCR: SARS Coronavirus 2 by RT PCR: NEGATIVE

## 2022-03-27 LAB — BETA-HYDROXYBUTYRIC ACID: Beta-Hydroxybutyric Acid: 0.72 mmol/L — ABNORMAL HIGH (ref 0.05–0.27)

## 2022-03-27 LAB — GLUCOSE, CAPILLARY
Glucose-Capillary: 250 mg/dL — ABNORMAL HIGH (ref 70–99)
Glucose-Capillary: 207 mg/dL — ABNORMAL HIGH (ref 70–99)
Glucose-Capillary: 211 mg/dL — ABNORMAL HIGH (ref 70–99)
Glucose-Capillary: 193 mg/dL — ABNORMAL HIGH (ref 70–99)
Glucose-Capillary: 156 mg/dL — ABNORMAL HIGH (ref 70–99)
Glucose-Capillary: 157 mg/dL — ABNORMAL HIGH (ref 70–99)
Glucose-Capillary: 141 mg/dL — ABNORMAL HIGH (ref 70–99)
Glucose-Capillary: 250 mg/dL — ABNORMAL HIGH (ref 70–99)
Glucose-Capillary: 276 mg/dL — ABNORMAL HIGH (ref 70–99)
Glucose-Capillary: 195 mg/dL — ABNORMAL HIGH (ref 70–99)

## 2022-03-27 LAB — CBG MONITORING, ED
Glucose-Capillary: >600 mg/dL (ref 70–99)
Glucose-Capillary: 438 mg/dL — ABNORMAL HIGH (ref 70–99)
Glucose-Capillary: 337 mg/dL — ABNORMAL HIGH (ref 70–99)

## 2022-03-27 LAB — MRSA NEXT GEN BY PCR, NASAL: MRSA by PCR Next Gen: NOT DETECTED

## 2022-03-27 MED ORDER — INSULIN ASPART 100 UNIT/ML IJ SOLN
0.0000 [IU] | Freq: Every day | INTRAMUSCULAR | Status: DC
  Administered 2022-03-27: 3 [IU] via SUBCUTANEOUS

## 2022-03-27 MED ORDER — SODIUM CHLORIDE 0.9% FLUSH: 3.0000 mL | INTRAVENOUS | Status: DC | PRN

## 2022-03-27 MED ORDER — SODIUM CHLORIDE 0.9 % IV BOLUS
1000.0000 mL | Freq: Once | INTRAVENOUS | Status: AC
  Administered 2022-03-27: 1000 mL via INTRAVENOUS

## 2022-03-27 MED ORDER — NICOTINE 21 MG/24HR TD PT24
21.0000 mg | MEDICATED_PATCH | Freq: Every day | TRANSDERMAL | Status: DC
  Administered 2022-03-27 – 2022-03-28 (×2): 21 mg via TRANSDERMAL
  Filled 2022-03-27 (×2): qty 1

## 2022-03-27 MED ORDER — ONDANSETRON HCL 4 MG/2ML IJ SOLN: 4.0000 mg | Freq: Four times a day (QID) | INTRAMUSCULAR | Status: DC | PRN

## 2022-03-27 MED ORDER — FLUCONAZOLE 150 MG PO TABS
150.0000 mg | ORAL_TABLET | Freq: Once | ORAL | Status: AC
  Administered 2022-03-27: 150 mg via ORAL
  Filled 2022-03-27: qty 1

## 2022-03-27 MED ORDER — INSULIN ASPART 100 UNIT/ML IJ SOLN
3.0000 [IU] | Freq: Three times a day (TID) | INTRAMUSCULAR | Status: DC
  Administered 2022-03-28 (×2): 3 [IU] via SUBCUTANEOUS

## 2022-03-27 MED ORDER — SODIUM CHLORIDE 0.9% FLUSH
3.0000 mL | Freq: Two times a day (BID) | INTRAVENOUS | Status: DC
  Administered 2022-03-27 – 2022-03-28 (×3): 3 mL via INTRAVENOUS

## 2022-03-27 MED ORDER — DEXTROSE IN LACTATED RINGERS 5 % IV SOLN: INTRAVENOUS | Status: AC

## 2022-03-27 MED ORDER — LORATADINE 10 MG PO TABS
10.0000 mg | ORAL_TABLET | Freq: Every day | ORAL | Status: DC
  Administered 2022-03-27 – 2022-03-28 (×2): 10 mg via ORAL
  Filled 2022-03-27 (×2): qty 1

## 2022-03-27 MED ORDER — POTASSIUM CHLORIDE CRYS ER 20 MEQ PO TBCR
40.0000 meq | EXTENDED_RELEASE_TABLET | ORAL | Status: AC
  Administered 2022-03-27 (×2): 40 meq via ORAL
  Filled 2022-03-27 (×2): qty 2

## 2022-03-27 MED ORDER — LACTATED RINGERS IV BOLUS
20.0000 mL/kg | Freq: Once | INTRAVENOUS | Status: AC
  Administered 2022-03-27: 1660 mL via INTRAVENOUS

## 2022-03-27 MED ORDER — DEXTROSE IN LACTATED RINGERS 5 % IV SOLN: INTRAVENOUS | Status: DC

## 2022-03-27 MED ORDER — LACTATED RINGERS IV SOLN: INTRAVENOUS | Status: DC

## 2022-03-27 MED ORDER — ACETAMINOPHEN 650 MG RE SUPP: 650.0000 mg | Freq: Four times a day (QID) | RECTAL | Status: DC | PRN

## 2022-03-27 MED ORDER — SODIUM CHLORIDE 0.9 % IV SOLN: INTRAVENOUS | Status: DC | PRN

## 2022-03-27 MED ORDER — BISACODYL 10 MG RE SUPP: 10.0000 mg | Freq: Every day | RECTAL | Status: DC | PRN

## 2022-03-27 MED ORDER — SODIUM CHLORIDE 0.9% FLUSH
3.0000 mL | Freq: Two times a day (BID) | INTRAVENOUS | Status: DC
  Administered 2022-03-27 – 2022-03-28 (×2): 3 mL via INTRAVENOUS

## 2022-03-27 MED ORDER — HEPARIN SODIUM (PORCINE) 5000 UNIT/ML IJ SOLN
5000.0000 [IU] | Freq: Three times a day (TID) | INTRAMUSCULAR | Status: DC
  Administered 2022-03-27 – 2022-03-28 (×3): 5000 [IU] via SUBCUTANEOUS
  Filled 2022-03-27 (×3): qty 1

## 2022-03-27 MED ORDER — CHLORHEXIDINE GLUCONATE CLOTH 2 % EX PADS: 6.0000 | MEDICATED_PAD | Freq: Every day | CUTANEOUS | Status: DC

## 2022-03-27 MED ORDER — DEXTROSE 50 % IV SOLN: 0.0000 mL | INTRAVENOUS | Status: DC | PRN

## 2022-03-27 MED ORDER — TRAZODONE HCL 50 MG PO TABS: 50.0000 mg | ORAL_TABLET | Freq: Every evening | ORAL | Status: DC | PRN

## 2022-03-27 MED ORDER — INSULIN GLARGINE-YFGN 100 UNIT/ML ~~LOC~~ SOLN
15.0000 [IU] | Freq: Every day | SUBCUTANEOUS | Status: DC
  Administered 2022-03-27 – 2022-03-28 (×2): 15 [IU] via SUBCUTANEOUS
  Filled 2022-03-27 (×3): qty 0.15

## 2022-03-27 MED ORDER — LISINOPRIL 5 MG PO TABS
5.0000 mg | ORAL_TABLET | Freq: Every day | ORAL | Status: DC
  Administered 2022-03-28: 5 mg via ORAL
  Filled 2022-03-27: qty 1

## 2022-03-27 MED ORDER — INSULIN REGULAR(HUMAN) IN NACL 100-0.9 UT/100ML-% IV SOLN
INTRAVENOUS | Status: DC
  Administered 2022-03-27: 18 [IU]/h via INTRAVENOUS
  Filled 2022-03-27: qty 100

## 2022-03-27 MED ORDER — POLYETHYLENE GLYCOL 3350 17 G PO PACK: 17.0000 g | PACK | Freq: Every day | ORAL | Status: DC | PRN

## 2022-03-27 MED ORDER — INSULIN ASPART 100 UNIT/ML IJ SOLN
0.0000 [IU] | Freq: Three times a day (TID) | INTRAMUSCULAR | Status: DC
  Administered 2022-03-28: 11 [IU] via SUBCUTANEOUS
  Administered 2022-03-28: 7 [IU] via SUBCUTANEOUS

## 2022-03-27 MED ORDER — LEVONORGESTREL 20 MCG/DAY IU IUD: 1.0000 | INTRAUTERINE_SYSTEM | Freq: Once | INTRAUTERINE | Status: DC

## 2022-03-27 MED ORDER — ONDANSETRON HCL 4 MG PO TABS: 4.0000 mg | ORAL_TABLET | Freq: Four times a day (QID) | ORAL | Status: DC | PRN

## 2022-03-27 MED ORDER — ACETAMINOPHEN 325 MG PO TABS: 650.0000 mg | ORAL_TABLET | Freq: Four times a day (QID) | ORAL | Status: DC | PRN

## 2022-03-27 MED ORDER — INSULIN ASPART 100 UNIT/ML IJ SOLN
10.0000 [IU] | Freq: Once | INTRAMUSCULAR | Status: AC
  Administered 2022-03-27: 10 [IU] via SUBCUTANEOUS

## 2022-03-27 MED ORDER — INSULIN REGULAR(HUMAN) IN NACL 100-0.9 UT/100ML-% IV SOLN
INTRAVENOUS | Status: AC
  Administered 2022-03-27: 1.4 [IU]/h via INTRAVENOUS

## 2022-03-27 NOTE — ED Provider Notes (Signed)
[facility] EMERGENCY DEPARTMENT Provider Note   CSN: [id number] Arrival date & time: 03/27/22  8453     History  Chief Complaint  Patient presents with   Hyperglycemia    Shelby Rubio is a 21 y.o. female.  Pt is a 21 yo female with a pmhx significant for DM2 and anxiety.  Pt said she was diagnosed in February.  She is supposed to be taking Metformin and Glipizide.  However, she has not been taking it because she has just forgotten.  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  Pt said she started feeling really bad a few days ago.  She has sx of a yeast infection.  She's tried otc monistat without improvement in vaginal sx.  Pt has been very thirsty.  No fevers.  Some nausea.  No vomiting.  Pt said she checked her bs this am and it was reading "HI."       Home Medications Prior to Admission medications   Medication Sig Start Date End Date Taking? Authorizing Provider  azithromycin (ZITHROMAX) 250 MG tablet Take 1 tablet (250 mg total) by mouth daily. Take first 2 tablets together, then 1 every day until finished. 02/19/22   Leath-Warren, Alda Lea, NP  blood glucose meter kit and supplies KIT Dispense based on patient and insurance preference. Use up to 2 times daily as directed. 02/22/22   Jeanie Sewer, NP  brompheniramine-pseudoephedrine-DM 30-2-10 MG/5ML syrup Take 5 mLs by mouth 4 (four) times daily as needed. 02/19/22   Leath-Warren, Alda Lea, NP  cetirizine (ZYRTEC) 10 MG tablet Take 1 tablet (10 mg total) by mouth daily. 02/19/22   Leath-Warren, Alda Lea, NP  fluticasone (FLONASE) 50 MCG/ACT nasal spray Place 2 sprays into both nostrils daily. 02/19/22   Leath-Warren, Alda Lea, NP  glipiZIDE (GLUCOTROL XL) 2.5 MG 24 hr tablet Take 1 tablet (2.5 mg total) by mouth 2 (two) times daily with a meal. 02/22/22   Jeanie Sewer, NP  levonorgestrel (MIRENA) 20 MCG/DAY IUD 1 each by Intrauterine route once.    [provider]  lisinopril (ZESTRIL)  5 MG tablet Take 1 tablet (5 mg total) by mouth daily. 11/09/21   Jeanie Sewer, NP  metFORMIN (GLUCOPHAGE-XR) 750 MG 24 hr tablet Take 1 tablet (750 mg total) by mouth 2 (two) times daily after a meal. 12/16/21   Jeanie Sewer, NP      Allergies    Penicillins and Amoxil [amoxicillin]    Review of Systems   Review of Systems  Gastrointestinal:  Positive for nausea.  Endocrine: Positive for polydipsia and polyuria.  Genitourinary:  Positive for vaginal discharge.  All other systems reviewed and are negative.   Physical Exam Updated Vital Signs BP (!) 144/67   Pulse (!) 104   Temp 98.6 F (37 C)   Resp 20   Ht _0  (1.626 m)   Wt 83 kg   LMP 03/21/2022   SpO2 100%   BMI 31.41 kg/m  Physical Exam Vitals and nursing note reviewed.  Constitutional:      Appearance: Normal appearance.  HENT:     Head: Normocephalic and atraumatic.     Right Ear: External ear normal.     Left Ear: External ear normal.     Nose: Nose normal.     Mouth/Throat:     Mouth: Mucous membranes are dry.  Eyes:     Extraocular Movements: Extraocular movements intact.     Pupils: Pupils  are equal, round, and reactive to light.  Cardiovascular:     Rate and Rhythm: Normal rate and regular rhythm.  Pulmonary:     Effort: Pulmonary effort is normal.     Breath sounds: Normal breath sounds.  Abdominal:     General: Abdomen is flat. Bowel sounds are normal.     Palpations: Abdomen is soft.  Musculoskeletal:        General: Normal range of motion.     Cervical back: Normal range of motion and neck supple.  Skin:    General: Skin is warm.     Capillary Refill: Capillary refill takes less than 2 seconds.  Neurological:     General: No focal deficit present.     Mental Status: She is alert and oriented to person, place, and time.  Psychiatric:        Mood and Affect: Mood normal.        Behavior: Behavior normal.     ED Results / Procedures / Treatments   Labs (all labs ordered are  listed, but only abnormal results are displayed) Labs Reviewed  COMPREHENSIVE METABOLIC PANEL - Abnormal; Notable for the following components:      Result Value   Sodium 131 (*)    Chloride 96 (*)    Glucose, Bld >1,200 (*)    AST 11 (*)    All other components within normal limits  CBC WITH DIFFERENTIAL/PLATELET - Abnormal; Notable for the following components:   Hemoglobin 15.8 (*)    MCHC 36.1 (*)    Eosinophils Absolute 0.7 (*)    All other components within normal limits  URINALYSIS, ROUTINE W REFLEX MICROSCOPIC - Abnormal; Notable for the following components:   Color, Urine COLORLESS (*)    Glucose, UA >=500 (*)    Hgb urine dipstick SMALL (*)    Ketones, ur 5 (*)    Bacteria, UA RARE (*)    All other components within normal limits  BLOOD GAS, VENOUS - Abnormal; Notable for the following components:   pH, Ven 7.46 (*)    pCO2, Ven 38 (*)    pO2, Ven 51 (*)    Acid-Base Excess 3.1 (*)    All other components within normal limits  BETA-HYDROXYBUTYRIC ACID - Abnormal; Notable for the following components:   Beta-Hydroxybutyric Acid 0.72 (*)    All other components within normal limits  CBG MONITORING, ED - Abnormal; Notable for the following components:   Glucose-Capillary >600 (*)    All other components within normal limits  CBG MONITORING, ED - Abnormal; Notable for the following components:   Glucose-Capillary 438 (*)    All other components within normal limits  SARS CORONAVIRUS 2 BY RT PCR  PREGNANCY, URINE  OSMOLALITY  BASIC METABOLIC PANEL    EKG None  Radiology DG Chest Portable 1 View  Result Date: 03/27/2022 CLINICAL DATA:  Hyperglycemia, dizziness and nausea. EXAM: PORTABLE CHEST 1 VIEW COMPARISON:  None Available. FINDINGS: The cardiomediastinal silhouette is unremarkable. There is no evidence of focal airspace disease, pulmonary edema, suspicious pulmonary nodule/mass, pleural effusion, or pneumothorax. No acute bony abnormalities are identified.  IMPRESSION: No active disease. Electronically Signed   By: Margarette Canada M.D.   On: 03/27/2022 10:00    Procedures Procedures    Medications Ordered in ED Medications  insulin regular, human (MYXREDLIN) 100 units/ 100 mL infusion (9 Units/hr Intravenous Rate/Dose Change 03/27/22 1107)  lactated ringers infusion ( Intravenous New Bag/Given 03/27/22 1110)  dextrose 5 % in lactated  ringers infusion (0 mLs Intravenous Hold 03/27/22 1028)  dextrose 50 % solution 0-50 mL (has no administration in time range)  fluconazole (DIFLUCAN) tablet 150 mg (has no administration in time range)  sodium chloride 0.9 % bolus 1,000 mL (0 mLs Intravenous Stopped 03/27/22 1104)  lactated ringers bolus 1,660 mL (1,660 mLs Intravenous New Bag/Given 03/27/22 1022)    ED Course/ Medical Decision Making/ A&P                           Medical Decision Making Amount and/or Complexity of Data Reviewed Labs: ordered. Radiology: ordered.  Risk Prescription drug management. Decision regarding hospitalization.   This patient presents to the ED for concern of elevated blood sugar, this involves an extensive number of treatment options, and is a complaint that carries with it a high risk of complications and morbidity.  The differential diagnosis includes HHS, DKA   Co morbidities that complicate the patient evaluation  DM2, depression   Additional history obtained:  Additional history obtained from epic chart review   Lab Tests:  I Ordered, and personally interpreted labs.  The pertinent results include:  cbc nl, cmp with glucose reading >1200, vbg with pH 7.46; ua nl other than ketones and glucose   Imaging Studies ordered:  I ordered imaging studies including cxr  I independently visualized and interpreted imaging which showed  IMPRESSION:  No active disease.   I agree with the radiologist interpretation   Cardiac Monitoring:  The patient was maintained on a cardiac monitor.  I personally viewed  and interpreted the cardiac monitored which showed an underlying rhythm of: nsr   Medicines ordered and prescription drug management:  I ordered medication including ivfs and insulin drip  for hhs  Reevaluation of the patient after these medicines showed that the patient improved I have reviewed the patients home medicines and have made adjustments as needed   Critical Interventions:  Insulin/ivfs   Consultations Obtained:  I requested consultation with the hospitalist (Dr. Maurene Capes),  and discussed lab and imaging findings as well as pertinent plan -he will admit.   Problem List / ED Course:  HHS:  IV insulin and IVFs ordered.  BS reading over 1200.   Reevaluation:  After the interventions noted above, I reevaluated the patient and found that they have :improved   Social Determinants of Health:  Lives at home   Dispostion:  After consideration of the diagnostic results and the patients response to treatment, I feel that the patent would benefit from admission.    CRITICAL CARE Performed by: Isla Pence   Total critical care time: 30 minutes  Critical care time was exclusive of separately billable procedures and treating other patients.  Critical care was necessary to treat or prevent imminent or life-threatening deterioration.  Critical care was time spent personally by me on the following activities: development of treatment plan with patient and/or surrogate as well as nursing, discussions with consultants, evaluation of patient's response to treatment, examination of patient, obtaining history from patient or surrogate, ordering and performing treatments and interventions, ordering and review of laboratory studies, ordering and review of radiographic studies, pulse oximetry and re-evaluation of patient's condition.         Final Clinical Impression(s) / ED Diagnoses Final diagnoses:  Hyperosmolar hyperglycemic state (HHS) [facility])    Rx / DC  Orders ED Discharge Orders     None         Isla Pence,  MD 03/27/22 1113

## 2022-03-27 NOTE — Progress Notes (Signed)
Patient admitted to ICU room #2 and denies and pain or discomfort at this time.  Small closed area under left breast.  Patient ambulated to bed with assistance related to IV tubing.

## 2022-03-27 NOTE — ED Triage Notes (Signed)
Pt c/o high blood sugar that started this morning. Pt reports her blood sugar machine read "HI" when she checked it this morning. Pt also reports dizziness, nausea, urinary urgency, blurry vision, and dry mouth. Pt is type 2 diabetic that was diagnosed in February 2023 and takes Metformin and Glipizide normally but hasn't taken it in the last 2 weeks due to forgetting to take it. Pt reports for the last week she has been having symptoms of a yeast infection such as itching and "cottage cheese" discharge. She has been using Monistat with no relief.

## 2022-03-27 NOTE — H&P (Addendum)
Patient Demographics:    Shelby Rubio, is a 21 y.o. female  MRN: [id number]   DOB - 04-Sep-2000  Admit Date - 03/27/2022  Outpatient Primary MD for the patient is Jeanie Sewer, NP   Assessment & Plan:   Assessment and Plan:  1)HHS--patient HHS ,(patient does not meet DKA criteria on admission) -bicarb is 23, , anion gap of 12, serum glucose of >>1,200 and beta hydroxybutyric acid of 0.72 -Treat with aggressive IV fluids, IV insulin, glucose check, frequent BMP and beta hydroxybutyric acid checks per Endo tool protocol -May switch from LR/NS to D5 solution when glucose drops below 250, per Endo tool protocol - 2) DM2----uncontrolled with hyperglycemia as noted above #1 -Interestingly patient's A1c was 5.1 in August 2020 -A1c was 8.9 on September 09, 2021, A1c was 9.2 on Dec 16, 2021 -- Patient has been noncompliant with metformin and glipizide -Patient will most likely need long-acting subcu insulin once daily combined with metformin and Amaryl with meals -Outpatient follow-up with endocrinology advised -Patient has a strong family history of diabetes mellitus  3)Tobacco abuse--- smoking cessation advised May use nicotine patch  4) class II obesity- -Low calorie diet, portion control and increase physical activity discussed with patient -Body mass index is 31.55 kg/m.   5)HTN--continue lisinopril  6) pseudohyponatremia--- when corrected for elevated glucose sodium is actually normal  Disposition/Need for in-Hospital Stay- patient unable to be discharged at this time due to --blood glucose over 1200 requiring aggressive IV hydration and IV insulin  Dispo: The patient is from: Home              Anticipated d/c is to: Home              Anticipated d/c date is: 1 day              Patient currently is not  medically stable to d/c. Barriers: Not Clinically Stable-   With History of - Reviewed by me  Past Medical History:  Diagnosis Date   Abnormal menses 06/06/2018   Anxiety 2011?   When diagnosed depressed   Asthma    When i was little   Behavior disorder    Diabetes mellitus without complication ([place]) 56/4332   Type 2   Dizziness 03/01/2019   History of hypoglycemia 03/01/2019   Lower abdominal pain 06/06/2018   Refused influenza vaccine 06/06/2018      Past Surgical History:  Procedure Laterality Date   ADENOIDECTOMY     TONSILLECTOMY     tubes in ears      Chief Complaint  Patient presents with   Hyperglycemia      HPI:    Shelby Rubio  is a 21 y.o. female with past medical history relevant for obesity, DM 2 (dxed on 08/06/2021), HTN and ongoing tobacco abuse who presents to the ED with complaints of fatigue weakness and dizziness, as well as nausea, polyuria and polydipsia -Patient admits to noncompliance with  metformin and glipizide... Patient complains of yeasty vaginal discharge as well and vaginal itching/discomfort No fever  Or chills  Patient has nausea but no vomiting, no diarrhea -She checked her blood sugar at home and her glucometer read high-- -in the ED her glucose is greater than 1200 -UA with glucosuria -Sodium is 131 when corrected for glucose sodium is actually normal -Potassium is 3.9, Chloride 96, bicarb is 23, anion gap is 12, creatinine 0.63 BUN is 14, LFTs are not elevated, but hydroxybutyric acid is 0.72 -Chest x-ray without acute findings    Review of systems:    In addition to the HPI above,   A full Review of  Systems was done, all other systems reviewed are negative except as noted above in HPI , .    Social History:  Reviewed by me    Social History   Tobacco Use   Smoking status: Every Day    Packs/day: 1.00    Years: 4.00    Total pack years: 4.00    Types: Cigarettes   Smokeless tobacco: Never  Substance Use Topics    Alcohol use: No     Family History :  Reviewed by me    Family History  Problem Relation Age of Onset   Diabetes Mother    Hypertension Mother    Obesity Mother    Asthma Sister    Asthma Brother    Diabetes Other    ADD / ADHD Father    Alcohol abuse Father    Anxiety disorder Father    Arthritis Father    Depression Father    Stroke Father    Arthritis Maternal Grandfather    Asthma Maternal Grandfather    Cancer Maternal Grandfather    Diabetes Maternal Grandfather    Hearing loss Maternal Grandfather    Kidney disease Maternal Grandfather    Arthritis Maternal Grandmother    Miscarriages / Stillbirths Maternal Grandmother    ADD / ADHD Paternal Grandfather    Anxiety disorder Paternal Grandfather    Arthritis Paternal Grandfather    Depression Paternal Grandfather    Diabetes Paternal Grandfather    Early death Paternal Grandfather    Heart disease Paternal Grandfather    Anxiety disorder Paternal Grandmother    Depression Paternal Grandmother    Diabetes Paternal Grandmother    Obesity Paternal Grandmother    ADD / ADHD Brother    Anxiety disorder Brother    Asthma Brother    Depression Brother    ADD / ADHD Brother    ADD / ADHD Paternal Uncle    Anxiety disorder Paternal Uncle    Arthritis Paternal Uncle    Asthma Paternal Uncle    Depression Paternal Uncle    Diabetes Paternal Uncle    Hypertension Paternal Uncle    Obesity Paternal Uncle    ADD / ADHD Paternal Uncle    Anxiety disorder Paternal Uncle    Arthritis Paternal Uncle    Asthma Paternal Uncle    Depression Paternal Uncle    Diabetes Paternal Uncle    Hypertension Paternal Uncle    Obesity Paternal Uncle    Anxiety disorder Sister    Asthma Sister    Depression Sister    Miscarriages / Stillbirths Sister    Anxiety disorder Paternal Uncle    Depression Paternal Uncle    Diabetes Paternal Uncle    Learning disabilities Paternal Uncle    Obesity Paternal Uncle    Diabetes Paternal  Uncle  Kidney disease Paternal Uncle    Obesity Paternal Uncle    Miscarriages / Stillbirths Maternal Aunt    Obesity Maternal Aunt     Home Medications:   Prior to Admission medications   Medication Sig Start Date End Date Taking? Authorizing Provider  azithromycin (ZITHROMAX) 250 MG tablet Take 1 tablet (250 mg total) by mouth daily. Take first 2 tablets together, then 1 every day until finished. 02/19/22   Leath-Warren, Alda Lea, NP  blood glucose meter kit and supplies KIT Dispense based on patient and insurance preference. Use up to 2 times daily as directed. 02/22/22   Jeanie Sewer, NP  brompheniramine-pseudoephedrine-DM 30-2-10 MG/5ML syrup Take 5 mLs by mouth 4 (four) times daily as needed. 02/19/22   Leath-Warren, Alda Lea, NP  cetirizine (ZYRTEC) 10 MG tablet Take 1 tablet (10 mg total) by mouth daily. 02/19/22   Leath-Warren, Alda Lea, NP  fluticasone (FLONASE) 50 MCG/ACT nasal spray Place 2 sprays into both nostrils daily. 02/19/22   Leath-Warren, Alda Lea, NP  glipiZIDE (GLUCOTROL XL) 2.5 MG 24 hr tablet Take 1 tablet (2.5 mg total) by mouth 2 (two) times daily with a meal. 02/22/22   Jeanie Sewer, NP  levonorgestrel (MIRENA) 20 MCG/DAY IUD 1 each by Intrauterine route once.    [provider]  lisinopril (ZESTRIL) 5 MG tablet Take 1 tablet (5 mg total) by mouth daily. 11/09/21   Jeanie Sewer, NP  metFORMIN (GLUCOPHAGE-XR) 750 MG 24 hr tablet Take 1 tablet (750 mg total) by mouth 2 (two) times daily after a meal. 12/16/21   Jeanie Sewer, NP  TRULICITY 0.35 KK/9.3GH SOPN SMARTSIG:0.5 Milliliter(s) SUB-Q Once a Week 03/16/22   [provider]     Allergies:     Allergies  Allergen Reactions   Penicillins Hives   Amoxil [Amoxicillin] Hives     Physical Exam:   Vitals  Blood pressure 107/69, pulse 84, temperature 97.7 F (36.5 C), temperature source Oral, resp. rate 19, height _0  (1.626 m), weight 83.4 kg, last menstrual  period 03/21/2022, SpO2 99 %.  Physical Examination: General appearance - alert, obese, in no distress  Mental status - alert, oriented to person, place, and time,  Eyes - sclera anicteric Neck - supple, no JVD elevation , Chest - clear  to auscultation bilaterally, symmetrical air movement, Heart - S1 and S2 normal, regular  Abdomen - soft, nontender, nondistended, +BS, increased truncal adiposity Neurological - screening mental status exam normal, neck supple without rigidity, cranial nerves II through XII intact, DTR's normal and symmetric Extremities - no pedal edema noted, intact peripheral pulses  Skin - warm, dry     Data Review:    CBC Recent Labs  Lab 03/27/22 0947  WBC 8.2  HGB 15.8*  HCT 43.8  PLT 315  MCV 87.8  MCH 31.7  MCHC 36.1*  RDW 12.2  LYMPHSABS 1.9  MONOABS 0.5  EOSABS 0.7*  BASOSABS 0.0   ------------------------------------------------------------------------------------------------------------------  Chemistries  Recent Labs  Lab 03/27/22 0947 03/27/22 1202  NA 131* 138  K 3.9 3.3*  CL 96* 105  CO2 23 22  GLUCOSE >1,200* 312*  BUN 14 10  CREATININE 0.63 0.53  CALCIUM 9.8 9.0  AST 11*  --   ALT 17  --   ALKPHOS 121  --   BILITOT 0.8  --    ------------------------------------------------------------------------------------------------------------------ estimated creatinine clearance is 114.3 mL/min (by C-G formula based on SCr of 0.53 mg/dL). ------------------------------------------------------------------------------------------------------------------  Urinalysis    Component Value Date/Time  COLORURINE COLORLESS (A) 03/27/2022 0936   APPEARANCEUR CLEAR 03/27/2022 0936   LABSPEC 1.027 03/27/2022 0936   PHURINE 6.0 03/27/2022 0936   GLUCOSEU >=500 (A) 03/27/2022 0936   HGBUR SMALL (A) 03/27/2022 0936   BILIRUBINUR NEGATIVE 03/27/2022 0936   BILIRUBINUR negative 08/06/2021 1631   KETONESUR 5 (A) 03/27/2022 0936   PROTEINUR  NEGATIVE 03/27/2022 0936   UROBILINOGEN 0.2 08/06/2021 1631   NITRITE NEGATIVE 03/27/2022 0936   LEUKOCYTESUR NEGATIVE 03/27/2022 0936    Imaging Results:    DG Chest Portable 1 View  Result Date: 03/27/2022 CLINICAL DATA:  Hyperglycemia, dizziness and nausea. EXAM: PORTABLE CHEST 1 VIEW COMPARISON:  None Available. FINDINGS: The cardiomediastinal silhouette is unremarkable. There is no evidence of focal airspace disease, pulmonary edema, suspicious pulmonary nodule/mass, pleural effusion, or pneumothorax. No acute bony abnormalities are identified. IMPRESSION: No active disease. Electronically Signed   By: Margarette Canada M.D.   On: 03/27/2022 10:00    Radiological Exams on Admission: DG Chest Portable 1 View  Result Date: 03/27/2022 CLINICAL DATA:  Hyperglycemia, dizziness and nausea. EXAM: PORTABLE CHEST 1 VIEW COMPARISON:  None Available. FINDINGS: The cardiomediastinal silhouette is unremarkable. There is no evidence of focal airspace disease, pulmonary edema, suspicious pulmonary nodule/mass, pleural effusion, or pneumothorax. No acute bony abnormalities are identified. IMPRESSION: No active disease. Electronically Signed   By: Margarette Canada M.D.   On: 03/27/2022 10:00    DVT Prophylaxis -SCD/Heparin AM Labs Ordered, also please review Full Orders  Family Communication: Admission, patients condition and plan of care including tests being ordered have been discussed with the patient and mother who indicate understanding and agree with the plan   Condition  -stable  Roxan Hockey M.D on 03/27/2022 at 4:20 PM Go to [email or web address] -  for contact info  [facility] - Office  [phone]

## 2022-03-28 ENCOUNTER — Other Ambulatory Visit (HOSPITAL_COMMUNITY): Payer: Self-pay

## 2022-03-28 ENCOUNTER — Telehealth (HOSPITAL_COMMUNITY): Payer: Self-pay | Admitting: Pharmacy Technician

## 2022-03-28 DIAGNOSIS — E1100 Type 2 diabetes mellitus with hyperosmolarity without nonketotic hyperglycemic-hyperosmolar coma (NKHHC): Secondary | ICD-10-CM | POA: Diagnosis not present

## 2022-03-28 LAB — RENAL FUNCTION PANEL
Albumin: 3.4 g/dL — ABNORMAL LOW (ref 3.5–5.0)
Anion gap: 5 (ref 5–15)
BUN: 11 mg/dL (ref 6–20)
CO2: 23 mmol/L (ref 22–32)
Calcium: 8.4 mg/dL — ABNORMAL LOW (ref 8.9–10.3)
Chloride: 111 mmol/L (ref 98–111)
Creatinine, Ser: 0.53 mg/dL (ref 0.44–1.00)
GFR, Estimated: >60 mL/min (ref >60)
Glucose, Bld: 230 mg/dL — ABNORMAL HIGH (ref 70–99)
Phosphorus: 3.5 mg/dL (ref 2.5–4.6)
Potassium: 3.8 mmol/L (ref 3.5–5.1)
Sodium: 139 mmol/L (ref 135–145)

## 2022-03-28 LAB — BASIC METABOLIC PANEL
Anion gap: 11 (ref 5–15)
BUN: 10 mg/dL (ref 6–20)
CO2: 22 mmol/L (ref 22–32)
Calcium: 9 mg/dL (ref 8.9–10.3)
Chloride: 105 mmol/L (ref 98–111)
Creatinine, Ser: 0.53 mg/dL (ref 0.44–1.00)
GFR, Estimated: >60 mL/min (ref >60)
Glucose, Bld: 312 mg/dL — ABNORMAL HIGH (ref 70–99)
Potassium: 3.3 mmol/L — ABNORMAL LOW (ref 3.5–5.1)
Sodium: 138 mmol/L (ref 135–145)

## 2022-03-28 LAB — HIV ANTIBODY (ROUTINE TESTING W REFLEX): HIV Screen 4th Generation wRfx: NONREACTIVE

## 2022-03-28 LAB — GLUCOSE, CAPILLARY
Glucose-Capillary: 264 mg/dL — ABNORMAL HIGH (ref 70–99)
Glucose-Capillary: 227 mg/dL — ABNORMAL HIGH (ref 70–99)

## 2022-03-28 MED ORDER — NICOTINE 21 MG/24HR TD PT24: 21.0000 mg | MEDICATED_PATCH | Freq: Every day | TRANSDERMAL | 0 refills | Status: DC

## 2022-03-28 MED ORDER — SODIUM CHLORIDE 0.9 % IV BOLUS
1000.0000 mL | Freq: Once | INTRAVENOUS | Status: AC
  Administered 2022-03-28: 1000 mL via INTRAVENOUS

## 2022-03-28 MED ORDER — METFORMIN HCL ER 750 MG PO TB24: 750.0000 mg | ORAL_TABLET | Freq: Two times a day (BID) | ORAL | 3 refills | Status: DC

## 2022-03-28 MED ORDER — BLOOD GLUCOSE MONITOR KIT: PACK | 11 refills | Status: DC

## 2022-03-28 MED ORDER — LISINOPRIL 5 MG PO TABS: 5.0000 mg | ORAL_TABLET | Freq: Every day | ORAL | 3 refills | Status: DC

## 2022-03-28 MED ORDER — CETIRIZINE HCL 10 MG PO TABS: 10.0000 mg | ORAL_TABLET | Freq: Every day | ORAL | 3 refills | Status: DC

## 2022-03-28 MED ORDER — TRULICITY 0.75 MG/0.5ML ~~LOC~~ SOAJ
SUBCUTANEOUS | 11 refills | Status: DC
Start: 2022-03-28 — End: 2022-04-19

## 2022-03-28 MED ORDER — TOUJEO MAX SOLOSTAR 300 UNIT/ML ~~LOC~~ SOPN: 15.0000 [IU] | PEN_INJECTOR | Freq: Every day | SUBCUTANEOUS | 5 refills | Status: DC

## 2022-03-28 MED ORDER — LIVING WELL WITH DIABETES BOOK: Freq: Once | Status: AC

## 2022-03-28 MED ORDER — FLUTICASONE PROPIONATE 50 MCG/ACT NA SUSP: 2.0000 | Freq: Every day | NASAL | 3 refills | Status: DC

## 2022-03-28 MED ORDER — INSULIN STARTER KIT- PEN NEEDLES (ENGLISH)
1.0000 | Freq: Once | Status: AC
  Administered 2022-03-28: 1
  Filled 2022-03-28: qty 1

## 2022-03-28 MED ORDER — NOVOLIN N FLEXPEN 100 UNIT/ML ~~LOC~~ SUPN: 15.0000 [IU] | PEN_INJECTOR | Freq: Two times a day (BID) | SUBCUTANEOUS | 11 refills | Status: DC

## 2022-03-28 MED ORDER — GLIMEPIRIDE 4 MG PO TABS: 4.0000 mg | ORAL_TABLET | Freq: Two times a day (BID) | ORAL | 3 refills | Status: DC

## 2022-03-28 NOTE — Telephone Encounter (Signed)
Pharmacy Patient Advocate Encounter  Insurance verification completed.    The patient is insured through [employer]   The patient is currently admitted and ran test claims for the following: Semglee, Toujeo.  Copays and coinsurance results were relayed to Inpatient clinical team.

## 2022-03-28 NOTE — TOC Progression Note (Signed)
Transition of Care [facility]) - Progression Note    Patient Details  Name: Shelby Rubio MRN: [id number] Date of Birth: 01-29-2001  Transition of Care [facility]) CM/SW Contact  Karn Cassis, [place] Phone Number: 03/28/2022, 10:25 AM  Clinical Narrative:   Transition of Care [facility]) Screening Note   Patient Details  Name: Shelby Rubio Date of Birth: 09/09/2000   Transition of Care [facility]) CM/SW Contact:    Karn Cassis, LCSW Phone Number: 03/28/2022, 10:26 AM  TOC received consult for medication assistance. Pt has Nurse, learning disability. Per MD, okay to cancel consult as pt was able to afford medication, just chose not to take it.   Transition of Care Department [facility]) has reviewed patient and no TOC needs have been identified at this time. We will continue to monitor patient advancement through interdisciplinary progression rounds. If new patient transition needs arise, please place a TOC consult.           Expected Discharge Plan and Services           Expected Discharge Date: 03/28/22                                     Social Determinants of Health (SDOH) Interventions    Readmission Risk Interventions     No data to display

## 2022-03-28 NOTE — TOC Benefit Eligibility Note (Signed)
Patient Product/process development scientist completed.    The patient is currently admitted and upon discharge could be taking Tuojeo 300unit/ml Solostar Pen.  The current 30 day co-pay is $522.95 due to a $1,277.82 deductible remaining.   The patient is currently admitted and upon discharge could be taking Semglee Pen.  Not on Formulary  The patient is insured through [employer]    Roland Earl, CPhT Pharmacy Patient Advocate Specialist [facility] Patient Advocate Team Direct Number: [phone]  Fax: [phone]

## 2022-03-28 NOTE — Discharge Instructions (Addendum)
1)Please take insulin injections twice daily with meals as prescribed 2)Please take metformin and Amaryl/glimepiride twice daily with meals as prescribed 3)Please follow-up with endocrinologist Dr. Purcell Nails, MD, Endocrinology, Diabetes & Metabolism-- [facility], [address] [place], [place] [address], Phone Number- tel:[phone]----for further adjustments of your insulin and diabetic regimen-------Please let him know about your insurance coverage limitations--- to make sure that the medications you are prescribed are affordable to you based on your insurance coverage. 4)Diabetic diet strongly advised 5)Low calorie diet, portion control and increase physical activity discussed with patient 6)Take Toujeo insulin 15 units at bedtime (if Insurance does Not cover Toujeo then take Novolin N Flexpens 15 units twice a day with meals)please do NOT take Toujeo and Novolin N together...its one or the other, Not Both

## 2022-03-28 NOTE — Discharge Summary (Signed)
Shelby Rubio, is a 21 y.o. female  DOB Sep 04, 2000  MRN [id number].  Admission date:  03/27/2022  Admitting Physician  Roxan Hockey, MD  Discharge Date:  03/28/2022   Primary MD  Jeanie Sewer, NP  Recommendations for primary care physician for things to follow:   1)Please take insulin injections twice daily with meals as prescribed 2)Please take metformin and Amaryl/glimepiride twice daily with meals as prescribed 3)Please follow-up with endocrinologist Dr. Loni Beckwith, MD, Endocrinology, Diabetes & Metabolism-- [facility], [address] [place], [place] [address], Phone Number- [phone]----for further adjustments of your insulin and diabetic regimen-------Please let him know about your insurance coverage limitations--- to make sure that the medications you are prescribed are affordable to you based on your insurance coverage. 4)Diabetic diet strongly advised 5)Low calorie diet, portion control and increase physical activity discussed with patient 6)Take Toujeo insulin 15 units at bedtime (if Insurance does Not cover Toujeo then take Novolin N Flexpens 15 units twice a day with meals)please do NOT take Toujeo and Novolin N together...its one or the other, Not Both  Admission Diagnosis  Hyperosmolar hyperglycemic state (HHS) ([place]) [E11.00]   Discharge Diagnosis  Hyperosmolar hyperglycemic state (HHS) ([place]) [E11.00]    Principal Problem:   Hyperosmolar hyperglycemic state (HHS) ([place]) Active Problems:   Obesity (BMI 30-39.9)   Tobacco abuse   Moderate episode of recurrent major depressive disorder ([place])   Type 2 diabetes mellitus with obesity ([place])   Hypertension associated with diabetes [facility])      Past Medical History:  Diagnosis Date   Abnormal menses 06/06/2018   Anxiety 2011?   When diagnosed depressed   Asthma    When i was little   Behavior disorder    Diabetes mellitus  without complication ([place]) 41/6384   Type 2   Dizziness 03/01/2019   History of hypoglycemia 03/01/2019   Lower abdominal pain 06/06/2018   Refused influenza vaccine 06/06/2018    Past Surgical History:  Procedure Laterality Date   ADENOIDECTOMY     TONSILLECTOMY     tubes in ears        HPI  from the history and physical done on the day of admission:  Shelby Rubio  is a 20 y.o. female with past medical history relevant for obesity, DM 2 (dxed on 08/06/2021), HTN and ongoing tobacco abuse who presents to the ED with complaints of fatigue weakness and dizziness, as well as nausea, polyuria and polydipsia -Patient admits to noncompliance with metformin and glipizide... Patient complains of yeasty vaginal discharge as well and vaginal itching/discomfort No fever  Or chills  Patient has nausea but no vomiting, no diarrhea -She checked her blood sugar at home and her glucometer read high-- -in the ED her glucose is greater than 1200 -UA with glucosuria -Sodium is 131 when corrected for glucose sodium is actually normal -Potassium is 3.9, Chloride 96, bicarb is 23, anion gap is 12, creatinine 0.63 BUN is 14, LFTs are not elevated, but hydroxybutyric acid is 0.72 -Chest x-ray without acute  findings     Hospital Course:    Assessment and Plan:  1)HHS--patient has HHS ,(patient does not meet DKA criteria on admission) -On admission bicarb was 23, , anion gap was 12, serum glucose of >>1,200 and beta hydroxybutyric acid of 0.72 -Treated with aggressive IV fluids, IV insulin per Endo tool protocol -HHS pathophysiology has resolved - 2) DM2----uncontrolled with hyperglycemia as noted above #1 -Interestingly patient's A1c was 5.1 in August 2020 -A1c was 8.9 on September 09, 2021, A1c was 9.2 on Dec 16, 2021 -Repeat A1c this admission pending -Discharge on insulin therapy, along with Amaryl and metformin with meals -Outpatient follow-up with endocrinologist Dr. Loni Beckwith  advised --Patient has a strong family history of diabetes mellitus -Insulin therapy education, diet education done over diabetic education provided for patient -Compliance encouraged   3)Tobacco abuse--- smoking cessation advised ,  use nicotine patch   4) class II obesity- -Low calorie diet, portion control and increase physical activity discussed with patient -Body mass index is 31.55 kg/m.    5)HTN--continue lisinopril   6) pseudohyponatremia--- when corrected for elevated glucose sodium is actually normal   Dispo: The patient is from: Home              Anticipated d/c is to: Home  Discharge Condition: stable  Follow UP   Follow-up Information     Jeanie Sewer, NP. Schedule an appointment as soon as possible for a visit in 2 week(s).   Specialty: Family Medicine Contact information: [place] [address] [phone]         Cassandria Anger, MD. Schedule an appointment as soon as possible for a visit in 3 week(s).   Specialty: Endocrinology Why: for diabetes-- Contact information: [place] [address] [phone]                 Consults obtained -diabetic educator  Diet and Activity recommendation:  As advised  Discharge Instructions     Discharge Instructions     Ambulatory referral to Nutrition and Diabetic Education   Complete by: As directed    DM dx 08/06/21 at Urgent Care; no formal DM dx until inpt. Discharged new to insulin   Call MD for:  difficulty breathing, headache or visual disturbances   Complete by: As directed    Call MD for:  persistant dizziness or light-headedness   Complete by: As directed    Call MD for:  persistant nausea and vomiting   Complete by: As directed    Call MD for:  temperature >100.4   Complete by: As directed    Diet - low sodium heart healthy   Complete by: As directed    Diet Carb Modified   Complete by: As directed    Discharge instructions    Complete by: As directed    1)Please take insulin injections twice daily with meals as prescribed 2)Please take metformin and Amaryl/glimepiride twice daily with meals as prescribed 3)Please follow-up with endocrinologist Dr. Loni Beckwith, MD, Endocrinology, Diabetes & Metabolism-- [facility], [address] [place],  [address], Phone Number- [phone]----for further adjustments of your insulin and diabetic regimen-------Please let him know about your insurance coverage limitations--- to make sure that the medications you are prescribed are affordable to you based on your insurance coverage. 4)Diabetic diet strongly advised 5)Low calorie diet, portion control and increase physical activity discussed with patient 6)Take Toujeo insulin 15 units at bedtime (if Insurance does Not cover Toujeo then take Novolin  N Flexpens 15 units twice a day with meals)please do NOT take Toujeo and Novolin N together...its one or the other, Not Both   Increase activity slowly   Complete by: As directed         Discharge Medications     Allergies as of 03/28/2022       Reactions   Penicillins Hives   Amoxil [amoxicillin] Hives        Medication List     STOP taking these medications    azithromycin 250 MG tablet Commonly known as: ZITHROMAX   brompheniramine-pseudoephedrine-DM 30-2-10 MG/5ML syrup   glipiZIDE 2.5 MG 24 hr tablet Commonly known as: GLUCOTROL XL       TAKE these medications    blood glucose meter kit and supplies Kit Dispense based on patient and insurance preference. Use up to four times daily as directed. What changed: additional instructions   cetirizine 10 MG tablet Commonly known as: ZYRTEC Take 1 tablet (10 mg total) by mouth daily.   fluticasone 50 MCG/ACT nasal spray Commonly known as: FLONASE Place 2 sprays into both nostrils daily.   glimepiride 4 MG tablet Commonly known as: Amaryl Take 1 tablet (4 mg total) by mouth 2 (two) times daily  before a meal.   levonorgestrel 20 MCG/DAY Iud Commonly known as: MIRENA 1 each by Intrauterine route once.   lisinopril 5 MG tablet Commonly known as: ZESTRIL Take 1 tablet (5 mg total) by mouth daily.   metFORMIN 750 MG 24 hr tablet Commonly known as: GLUCOPHAGE-XR Take 1 tablet (750 mg total) by mouth 2 (two) times daily after a meal.   nicotine 21 mg/24hr patch Commonly known as: NICODERM CQ - dosed in mg/24 hours Place 1 patch (21 mg total) onto the skin daily. Start taking on: March 29, 2022   NovoLIN N FlexPen 100 UNIT/ML Mayer Masker Generic drug: Insulin NPH (Human) (Isophane) Inject 15 Units into the skin 2 (two) times daily before a meal.   Toujeo Max SoloStar 300 UNIT/ML Solostar Pen Generic drug: insulin glargine (2 Unit Dial) Inject 15 Units into the skin at bedtime. Take Toujeo insulin 15 units at bedtime (if Insurance does Not cover Toujeo then take Novolin N Flexpens 15 units twice a day with meals)please do NOT take Toujeo and Novolin N together...its one or the other, Not Both   Trulicity 2.99 BZ/1.6RC Sopn Generic drug: Dulaglutide SMARTSIG:0.5 Milliliter(s) SUB-Q Once a Week        Major procedures and Radiology Reports - PLEASE review detailed and final reports for all details, in brief -    DG Chest Portable 1 View  Result Date: 03/27/2022 CLINICAL DATA:  Hyperglycemia, dizziness and nausea. EXAM: PORTABLE CHEST 1 VIEW COMPARISON:  None Available. FINDINGS: The cardiomediastinal silhouette is unremarkable. There is no evidence of focal airspace disease, pulmonary edema, suspicious pulmonary nodule/mass, pleural effusion, or pneumothorax. No acute bony abnormalities are identified. IMPRESSION: No active disease. Electronically Signed   By: Margarette Canada M.D.   On: 03/27/2022 10:00    Micro Results   Recent Results (from the past 240 hour(s))  SARS Coronavirus 2 by RT PCR (hospital order, performed in [facility] hospital lab) *cepheid single result test*  Anterior Nasal Swab     Status: None   Collection Time: 03/27/22  9:36 AM   Specimen: Anterior Nasal Swab  Result Value Ref Range Status   SARS Coronavirus 2 by RT PCR NEGATIVE NEGATIVE Final    Comment: (NOTE) SARS-CoV-2 target nucleic acids are NOT  DETECTED.  The SARS-CoV-2 RNA is generally detectable in upper and lower respiratory specimens during the acute phase of infection. The lowest concentration of SARS-CoV-2 viral copies this assay can detect is 250 copies / mL. A negative result does not preclude SARS-CoV-2 infection and should not be used as the sole basis for treatment or other patient management decisions.  A negative result may occur with improper specimen collection / handling, submission of specimen other than nasopharyngeal swab, presence of viral mutation(s) within the areas targeted by this assay, and inadequate number of viral copies (<250 copies / mL). A negative result must be combined with clinical observations, patient history, and epidemiological information.  Fact Sheet for Patients:   https://www.patel.info/  Fact Sheet for Healthcare Providers: https://hall.com/  This test is not yet approved or  cleared by the [place] FDA and has been authorized for detection and/or diagnosis of SARS-CoV-2 by FDA under an Emergency Use Authorization (EUA).  This EUA will remain in effect (meaning this test can be used) for the duration of the COVID-19 declaration under Section 564(b)(1) of the Act, 21 U.S.C. section 360bbb-3(b)(1), unless the authorization is terminated or revoked sooner.  Performed at [facility], [address], [place],  [address]   MRSA Next Gen by PCR, Nasal     Status: None   Collection Time: 03/27/22 11:58 AM   Specimen: Nasal Mucosa; Nasal Swab  Result Value Ref Range Status   MRSA by PCR Next Gen NOT DETECTED NOT DETECTED Final    Comment: (NOTE) The GeneXpert MRSA Assay (FDA  approved for NASAL specimens only), is one component of a comprehensive MRSA colonization surveillance program. It is not intended to diagnose MRSA infection nor to guide or monitor treatment for MRSA infections. Test performance is not FDA approved in patients less than 73 years old. Performed at [facility], [address] [place],  [address]     Today   Subjective    Shelby Rubio today has no new complaints         -Patient's mother at bedside, questions answered   Patient has been seen and examined prior to discharge   Objective   Blood pressure 117/80, pulse 93, temperature 98.5 F (36.9 C), temperature source Oral, resp. rate (!) 24, height 5' 4"  (1.626 m), weight 83.4 kg, last menstrual period 03/21/2022, SpO2 99 %.   Intake/Output Summary (Last 24 hours) at 03/28/2022 1239 Last data filed at 03/28/2022 7253 Gross per 24 hour  Intake 2427.99 ml  Output --  Net 2427.99 ml    Exam Gen:- Awake Alert, no acute distress  HEENT:- .AT, No sclera icterus Neck-Supple Neck,No JVD,.  Lungs-  CTAB , good air movement bilaterally CV- S1, S2 normal, regular Abd-  +ve B.Sounds, Abd Soft, No tenderness, increased truncal adiposity    Extremity/Skin:- No  edema,   good pulses Psych-affect is appropriate, oriented x3 Neuro-no new focal deficits, no tremors    Data Review   CBC w Diff:  Lab Results  Component Value Date   WBC 8.2 03/27/2022   HGB 15.8 (H) 03/27/2022   HGB 15.2 06/06/2018   HCT 43.8 03/27/2022   HCT 43.9 06/06/2018   PLT 315 03/27/2022   PLT 363 06/06/2018   LYMPHOPCT 23 03/27/2022   MONOPCT 6 03/27/2022   EOSPCT 9 03/27/2022   BASOPCT 0 03/27/2022   CMP:  Lab Results  Component Value Date   NA 139 03/28/2022   NA 139 08/06/2021   K 3.8 03/28/2022  CL 111 03/28/2022   CO2 23 03/28/2022   BUN 11 03/28/2022   BUN 11 08/06/2021   CREATININE 0.53 03/28/2022   PROT 7.9 03/27/2022   PROT 7.7 08/06/2021   ALBUMIN 3.4 (L) 03/28/2022    ALBUMIN 5.1 (H) 08/06/2021   BILITOT 0.8 03/27/2022   BILITOT 0.3 08/06/2021   ALKPHOS 121 03/27/2022   AST 11 (L) 03/27/2022   ALT 17 03/27/2022   Total Discharge time is about 33 minutes  Roxan Hockey M.D on 03/28/2022 at 12:39 PM  Go to [email or web address] -  for contact info  [facility] - Office  [phone]

## 2022-03-28 NOTE — Inpatient Diabetes Management (Addendum)
Inpatient Diabetes Program Recommendations  AACE/ADA: New Consensus Statement on Inpatient Glycemic Control  Target Ranges:  Prepandial:   less than 140 mg/dL      Peak postprandial:   less than 180 mg/dL (1-2 hours)      Critically ill patients:  140 - 180 mg/dL    Latest Reference Range & Units 03/27/22 23:05 03/28/22 07:36  Glucose-Capillary 70 - 99 mg/dL 195 (H) 264 (H)    Latest Reference Range & Units 03/27/22 18:03 03/27/22 18:50 03/27/22 19:58 03/27/22 20:52  Glucose-Capillary 70 - 99 mg/dL 157 (H) 141 (H) 250 (H) 276 (H)    Latest Reference Range & Units 03/27/22 09:47  CO2 22 - 32 mmol/L 23  Glucose 70 - 99 mg/dL >1,200 (HH)  Anion gap 5 - 15  12    Latest Reference Range & Units 09/09/21 13:50 12/16/21 15:18  Hemoglobin A1C 4.6 - 6.5 % 8.9 (H) 9.2 (H)   Review of Glycemic Control  Diabetes history: DM2 (dx 08/06/21 at Urgent Care) Outpatient Diabetes medications: Glipizide XL 2.5 mg BID, Metformin XR 709 mg BID, Trulicity 6.28 mg Qweek Current orders for Inpatient glycemic control: Semglee 15 units daily, Novolog 0-20 units TID with meals, Novolog 0-5 units QHS, Novolog 3 units TID with meals  Inpatient Diabetes Program Recommendations:    Insulin: Please consider increasing Semglee to 24 units daily.  Outpatient: At discharge, please provide Rx for glucose monitoring kit (#[id number]), insulin pen needles (#[id number]), and Novolin N Flexpens [phone]).  NOTE: Patient admitted with HHS with initial glucose of over 1200 mg/dl on 03/27/22. Patient was initially started on IV insulin and was transitioned to SQ insulin on 03/27/22. Patient received Semglee 15 units at 18:10 on 03/27/22 and fasting glucose 264 mg/dl today. In reviewing chart, noted patient was dx with DM2 on 08/06/21 in Urgent Care and was initially started on Metformin; she followed up with PCP on 09/09/21 and was switched to Metformin XR and started on Trulicity.  Noted patient last seen PCP on 02/22/22 and per office note  patient stopped Trulicity due to side effects and patient was told to continue Metformin XR 750 mg BID and Glipizide XL 2.5 mg BID. Ordered Living Well with DM book, insulin pen starter kit, and patient education by bedside RNs. Will plan to speak with patient today.  Addendum 03/28/22-Spoke with patient over the phone about diabetes. Patient confirms that she was dx with DM2 on 08/06/21 at urgent care and was initially started on Metformin. Patient reports that she has not received any formal DM education. Patient reports that she was taking Metformin XR 750 mg BID and Glipizide 2.5 mg BID but she reports she has not taken either medication for 2 weeks. Patient reports that she "just forgot about taking the medications".  Patient confirms that she had been on Trulicity but did not tolerate it.   Patient states she has been paying for glucose testing supplies out of pocket and her PCP was suppose to send in Rx for glucometer and glucose monitoring supplies but her pharmacy states they don't have Rx for it. Patient was only checking glucose 1-2 times per week at home; when she was taking oral DM medication consistently, her glucose was 200-300's mg/dl.  Discussed basic pathophysiology of DM Type 2, basic home care, importance of checking CBGs and maintaining good CBG control to prevent long-term and short-term complications. Discussed what an A1C is and explained that current A1C has not resulted but encouraged patient to check My Chart  to see what A1C currently is; anticipate it will be high due to patient not taking DM medications in 2 weeks and with initial glucose over 1200 mg/dl.  Discussed importance of checking CBGs and maintaining good CBG control to prevent long-term and short-term complications. Explained how hyperglycemia leads to damage within blood vessels which lead to the common complications seen with uncontrolled diabetes. Stressed to the patient the importance of improving glycemic control to prevent  further complications from uncontrolled diabetes. Reviewed glucose and A1C goals.  Reviewed signs and symptoms of hyperglycemia and hypoglycemia along with treatment for both. Discussed impact of nutrition, exercise, stress, sickness, and medications on diabetes control. Patient reports that she has been cutting back on portion sizes, drinking more water, and cutting out sweets and she was able to lose 22 pounds on her own.  Discussed impact of nutrition, exercise, stress, sickness, and medications on diabetes control.  Discussed carbohydrates, carbohydrate goals per day and meal, along with portion sizes.  Informed patient that a Living Well with diabetes booklet was ordered and encouraged patient to read through entire book. Informed patient that she will be on basal insulin (per progress notes) along with oral DM medications. Patient states she has never taken insulin herself but she has administered insulin with vial/syringe to her grandmother in the past.  Discussed basal insulin and how it works. Patient feels an insulin pen would be easier for her to use. Discussed insulin, how to store, injection sites, and importance of rotating injection sites. Discussed how to use an insulin pen (step by step). Informed patient that an insulin pen video QR code would be on discharge paperwork so she can watch a video on using an insulin pen. Asked patient to also look online for video about how to use an insulin pen to increase knowledge.  Encouraged patient take insulin and oral DM medications as prescribed at discharge. Encouraged patient to set reminders in her phone to help her remember to take medications consistently. Asked patient to check glucose 3-4 times per day. Explained how PCP can use the CBG values to continue to make adjustments with DM medications if needed.  Informed patient that RN will be asking her to self-administer insulin to ensure proper technique and ability to administer self insulin shots.    Patient verbalized understanding of information discussed and he states that he has no further questions at this time related to diabetes.   RNs to provide ongoing basic DM education at bedside with this patient and engage patient to actively check blood glucose and administer insulin injections. Ordered outpatient DM education (cosign required). Had Bethann Humble, CPhT to check to see which basal insulin is covered by insurance and Nelva Nay is preferred and copay would be $522.95 due to a $1,277.82 deductible remaining; however, patient should be able to use Toujeo savings card to decrease copay.  Spoke with patient again over the phone and discussed Toujeo copay and savings card she can find online which should decrease copay. Patient states she works part time and cost may be an issue for her. Discussed generic insulins at [facility] (Novolin NPH) which are $25 per vial or $43 per box of 5 insulin pens. Patient states that she would prefer to use the NPH insulin pens as that would be more affordable for her. Explained how NPH works and that it is typically taken twice a day; patient agreeable to take it twice a day if prescribed.  At discharge, please provide Rx for glucose monitoring kit (#  [id number]), insulin pen needles [phone]), and Novolin N Flexpens [phone]). Sent chat message to Judson Roch, RN to ask that she review insulin pen with patient using demo pen.  Thanks, Barnie Alderman, RN, MSN, [facility] Diabetes Coordinator Inpatient Diabetes Program [phone] (Team Pager from 8am to [place])

## 2022-03-29 LAB — HEMOGLOBIN A1C
Hgb A1c MFr Bld: 13.2 % — ABNORMAL HIGH (ref 4.8–5.6)
Mean Plasma Glucose: 332 mg/dL

## 2022-04-19 ENCOUNTER — Encounter: Payer: BC Managed Care – PPO | Attending: Family | Admitting: Nutrition

## 2022-04-19 ENCOUNTER — Encounter: Payer: Self-pay | Admitting: Nutrition

## 2022-04-19 ENCOUNTER — Ambulatory Visit (INDEPENDENT_AMBULATORY_CARE_PROVIDER_SITE_OTHER): Payer: BC Managed Care – PPO | Admitting: "Endocrinology

## 2022-04-19 ENCOUNTER — Encounter: Payer: Self-pay | Admitting: "Endocrinology

## 2022-04-19 VITALS — BP 112/76 | HR 80 | Ht 64.0 in | Wt 191.8 lb

## 2022-04-19 VITALS — Ht 64.0 in | Wt 191.8 lb

## 2022-04-19 DIAGNOSIS — E669 Obesity, unspecified: Secondary | ICD-10-CM | POA: Diagnosis present

## 2022-04-19 DIAGNOSIS — E1169 Type 2 diabetes mellitus with other specified complication: Secondary | ICD-10-CM | POA: Diagnosis present

## 2022-04-19 DIAGNOSIS — E1159 Type 2 diabetes mellitus with other circulatory complications: Secondary | ICD-10-CM | POA: Insufficient documentation

## 2022-04-19 DIAGNOSIS — I152 Hypertension secondary to endocrine disorders: Secondary | ICD-10-CM | POA: Insufficient documentation

## 2022-04-19 DIAGNOSIS — E1165 Type 2 diabetes mellitus with hyperglycemia: Secondary | ICD-10-CM | POA: Diagnosis not present

## 2022-04-19 DIAGNOSIS — Z720 Tobacco use: Secondary | ICD-10-CM | POA: Insufficient documentation

## 2022-04-19 DIAGNOSIS — F331 Major depressive disorder, recurrent, moderate: Secondary | ICD-10-CM | POA: Insufficient documentation

## 2022-04-19 DIAGNOSIS — Z794 Long term (current) use of insulin: Secondary | ICD-10-CM

## 2022-04-19 MED ORDER — FREESTYLE LIBRE 3 SENSOR MISC: 1.0000 | 2 refills | Status: DC

## 2022-04-19 MED ORDER — BD PEN NEEDLE NANO U/F 32G X 4 MM MISC: 1.0000 | Freq: Four times a day (QID) | 2 refills | Status: DC

## 2022-04-19 NOTE — Progress Notes (Signed)
Medical Nutrition Therapy  Appointment Start time:  1100  Appointment End time:  1130  Primary concerns today: New Type 2 DM Walk in from Dr. Fransico Him  Referral diagnosis: E11.8 Preferred learning style:  No preference  Learning readiness: Change in progress    NUTRITION ASSESSMENT  Walk in from Dr. Fransico Him, Endocrinology. 51 y old wfemale who got diagnosed in Feb 2023 with Type 2 DM. Recent hospitalization with GLU > 1200. Most recent A1C 13%. Is going to start 20 units of Tresiba today, taking 750 mg BID of Metformin XR. Stopped Amaryl today per Dr. Fransico Him. She admits she hadn't been compliant with medications before hospitalization, but is doing better. To get A LIBRE. She is willing to work on lifestyle medicine to help improve her DM and reduce complications. She notes she loves red meat and foods that have a lot of carbs-pasta and bread and sugary foods. She has been working on cutting down on pasta and bread and eating more fruits and vegetables.  Lost 40 lbs but has gained back 7 lbs from rehydration.  Will address safety questions next visit.  Anthropometrics  Wt Readings from Last 3 Encounters:  04/19/22 191 lb 12.8 oz (87 kg)  04/19/22 191 lb 12.8 oz (87 kg)  03/27/22 183 lb 12.8 oz (83.4 kg)   Ht Readings from Last 3 Encounters:  04/19/22 5\' 4"  (1.626 m)  04/19/22 5\' 4"  (1.626 m)  03/27/22 5\' 4"  (1.626 m)   Body mass index is 32.92 kg/m. @BMIFA @ Facility age limit for growth %iles is 20 years. Facility age limit for growth %iles is 20 years.    Clinical Medical Hx: See chart Medications: Tresiba 20 units, Metformin 750 mg BID Labs:  Lab Results  Component Value Date   HGBA1C 13.2 (H) 03/27/2022      Latest Ref Rng & Units 03/28/2022    3:29 AM 03/27/2022   12:02 PM 03/27/2022    9:47 AM  CMP  Glucose 70 - 99 mg/dL 03/29/2022  03/30/2022  03/29/2022   BUN 6 - 20 mg/dL 11  10  14    Creatinine 0.44 - 1.00 mg/dL 03/29/2022  893  810   Sodium 135 - 145 mmol/L 139  138  131   Potassium  3.5 - 5.1 mmol/L 3.8  3.3  3.9   Chloride 98 - 111 mmol/L 111  105  96   CO2 22 - 32 mmol/L 23  22  23    Calcium 8.9 - 10.3 mg/dL 8.4  9.0  9.8   Total Protein 6.5 - 8.1 g/dL   7.9   Total Bilirubin 0.3 - 1.2 mg/dL   0.8   Alkaline Phos 38 - 126 U/L   121   AST 15 - 41 U/L   11   ALT 0 - 44 U/L   17    Lipid Panel     Component Value Date/Time   CHOL 136 09/09/2021 1350   TRIG 119.0 09/09/2021 1350   HDL 33.00 (L) 09/09/2021 1350   CHOLHDL 4 09/09/2021 1350   VLDL 23.8 09/09/2021 1350   LDLCALC 79 09/09/2021 1350     Notable Signs/Symptoms: increased thirst, weight loss, blurry vision, fatigue, frequent urination.  Lifestyle & Dietary Hx Works at 11/07/2021   Estimated daily fluid intake: 40 oz Supplements:  Sleep: varies Stress / self-care: her DM Current average weekly physical activity: walks on job a lot  24-Hr Dietary Recall Eats 2-3 meals per day.   Estimated Energy Needs  Calories: 1500 Carbohydrate: 170g Protein: 112g Fat: 42g   NUTRITION DIAGNOSIS  NB-1.1 Food and nutrition-related knowledge deficit As related to Diabetes Type 2.  As evidenced by A1C 13.%.   NUTRITION INTERVENTION  Nutrition education (E-1) on the following topics:  Nutrition and Diabetes education provided on My Plate, CHO counting, meal planning, portion sizes, timing of meals, avoiding snacks between meals unless having a low blood sugar, target ranges for A1C and blood sugars, signs/symptoms and treatment of hyper/hypoglycemia, monitoring blood sugars, taking medications as prescribed, benefits of exercising 30 minutes per day and prevention of complications of DM.  Lifestyle Medicine  - Whole Food, Plant Predominant Nutrition is highly recommended: Eat Plenty of vegetables, Mushrooms, fruits, Legumes, Whole Grains, Nuts, seeds in lieu of processed meats, processed snacks/pastries red meat, poultry, eggs.    -It is better to avoid simple carbohydrates including: Cakes,  Sweet Desserts, Ice Cream, Soda (diet and regular), Sweet Tea, Candies, Chips, Cookies, Store Bought Juices, Alcohol in Excess of  1-2 drinks a day, Lemonade,  Artificial Sweeteners, Doughnuts, Coffee Creamers, "Sugar-free" Products, etc, etc.  This is not a complete list.....  Exercise: If you are able: 30 -60 minutes a day ,4 days a week, or 150 minutes a week.  The longer the better.  Combine stretch, strength, and aerobic activities.  If you were told in the past that you have high risk for cardiovascular diseases, you may seek evaluation by your heart doctor prior to initiating moderate to intense exercise programs.   Handouts Provided Include  Lifestyle Medicine Know your numbers  Learning Style & Readiness for Change Teaching method utilized: Visual & Auditory  Demonstrated degree of understanding via: Teach Back  Barriers to learning/adherence to lifestyle change: none  Goals Established by Pt  1800 QUIT NOW for patches and counseling for smoking cessation. Eat 3 balanced meals at times discussed Don't skip meals Meal Plan and take meals to work. Take Medications as prescribed Call if you need help putting on your [place]. Increase plant based foods and cut out processed foods Cut out soda, sugary beverages. Watch portions of pastas and heavier carb vegetables. Get A1C down to 7% Avoid hypoglycemia.   MONITORING & EVALUATION Dietary intake, weekly physical activity, and blood sugars 2 in 1 month.  Next Steps  Patient is to Work on meal planning and taking meals to work.

## 2022-04-19 NOTE — Progress Notes (Signed)
Endocrinology Consult Note       04/19/2022, 12:58 PM   Subjective:    Patient ID: Shelby Rubio, female    DOB: [birth year].  Shelby Rubio is being seen in consultation for management of currently uncontrolled symptomatic diabetes requested by  Jeanie Sewer, NP.   Past Medical History:  Diagnosis Date   Abnormal menses 06/06/2018   Anxiety 2011?   When diagnosed depressed   Asthma    When i was little   Behavior disorder    Diabetes mellitus without complication ([place]) 15/3794   Type 2   Dizziness 03/01/2019   History of hypoglycemia 03/01/2019   Lower abdominal pain 06/06/2018   Refused influenza vaccine 06/06/2018    Past Surgical History:  Procedure Laterality Date   ADENOIDECTOMY     TONSILLECTOMY     tubes in ears      Social History   Socioeconomic History   Marital status: Single    Spouse name: Not on file   Number of children: Not on file   Years of education: Not on file   Highest education level: Not on file  Occupational History   Not on file  Tobacco Use   Smoking status: Every Day    Packs/day: 1.00    Years: 4.00    Total pack years: 4.00    Types: Cigarettes   Smokeless tobacco: Never  Vaping Use   Vaping Use: Some days  Substance and Sexual Activity   Alcohol use: No   Drug use: Never   Sexual activity: Yes    Birth control/protection: Abstinence, Condom, I.U.D.  Other Topics Concern   Not on file  Social History Narrative   Not on file   Social Determinants of Health   Financial Resource Strain: Not on file  Food Insecurity: Not on file  Transportation Needs: Not on file  Physical Activity: Not on file  Stress: Not on file  Social Connections: Not on file    Family History  Problem Relation Age of Onset   Diabetes Mother    Hypertension Mother    Obesity Mother    Asthma Sister    Asthma Brother    Diabetes Other    ADD / ADHD Father     Alcohol abuse Father    Anxiety disorder Father    Arthritis Father    Depression Father    Stroke Father    Arthritis Maternal Grandfather    Asthma Maternal Grandfather    Cancer Maternal Grandfather    Diabetes Maternal Grandfather    Hearing loss Maternal Grandfather    Kidney disease Maternal Grandfather    Arthritis Maternal Grandmother    Miscarriages / Stillbirths Maternal Grandmother    ADD / ADHD Paternal Grandfather    Anxiety disorder Paternal Grandfather    Arthritis Paternal Grandfather    Depression Paternal Grandfather    Diabetes Paternal Grandfather    Early death Paternal Grandfather    Heart disease Paternal Grandfather    Anxiety disorder Paternal Grandmother    Depression Paternal Grandmother    Diabetes Paternal Grandmother    Obesity Paternal Grandmother  ADD / ADHD Brother    Anxiety disorder Brother    Asthma Brother    Depression Brother    ADD / ADHD Brother    ADD / ADHD Paternal Uncle    Anxiety disorder Paternal Uncle    Arthritis Paternal Uncle    Asthma Paternal Uncle    Depression Paternal Uncle    Diabetes Paternal Uncle    Hypertension Paternal Uncle    Obesity Paternal Uncle    ADD / ADHD Paternal Uncle    Anxiety disorder Paternal Uncle    Arthritis Paternal Uncle    Asthma Paternal Uncle    Depression Paternal Uncle    Diabetes Paternal Uncle    Hypertension Paternal Uncle    Obesity Paternal Uncle    Anxiety disorder Sister    Asthma Sister    Depression Sister    Miscarriages / Stillbirths Sister    Anxiety disorder Paternal Uncle    Depression Paternal Uncle    Diabetes Paternal Uncle    Learning disabilities Paternal Uncle    Obesity Paternal Uncle    Diabetes Paternal Uncle    Kidney disease Paternal Uncle    Obesity Paternal Uncle    Miscarriages / Stillbirths Maternal Aunt    Obesity Maternal Aunt     Outpatient Encounter Medications as of 04/19/2022  Medication Sig   Continuous Blood Gluc Sensor (FREESTYLE  LIBRE 3 SENSOR) MISC 1 Piece by Does not apply route every 14 (fourteen) days. Place 1 sensor on the skin every 14 days. Use to check glucose continuously   insulin degludec (TRESIBA) 200 UNIT/ML FlexTouch Pen Inject 20 Units into the skin at bedtime.   Insulin Pen Needle (BD PEN NEEDLE NANO U/F) 32G X 4 MM MISC 1 each by Does not apply route 4 (four) times daily.   blood glucose meter kit and supplies KIT Dispense based on patient and insurance preference. Use up to four times daily as directed.   cetirizine (ZYRTEC) 10 MG tablet Take 1 tablet (10 mg total) by mouth daily.   fluticasone (FLONASE) 50 MCG/ACT nasal spray Place 2 sprays into both nostrils daily.   levonorgestrel (MIRENA) 20 MCG/DAY IUD 1 each by Intrauterine route once.   lisinopril (ZESTRIL) 5 MG tablet Take 1 tablet (5 mg total) by mouth daily.   metFORMIN (GLUCOPHAGE-XR) 750 MG 24 hr tablet Take 1 tablet (750 mg total) by mouth 2 (two) times daily after a meal.   nicotine (NICODERM CQ - DOSED IN MG/24 HOURS) 21 mg/24hr patch Place 1 patch (21 mg total) onto the skin daily. (Patient not taking: Reported on 04/19/2022)   [DISCONTINUED] glimepiride (AMARYL) 4 MG tablet Take 1 tablet (4 mg total) by mouth 2 (two) times daily before a meal.   [DISCONTINUED] insulin glargine, 2 Unit Dial, (TOUJEO MAX SOLOSTAR) 300 UNIT/ML Solostar Pen Inject 15 Units into the skin at bedtime. Take Toujeo insulin 15 units at bedtime (if Insurance does Not cover Toujeo then take Novolin N Flexpens 15 units twice a day with meals)please do NOT take Toujeo and Novolin N together...its one or the other, Not Both (Patient not taking: Reported on 04/19/2022)   [DISCONTINUED] Insulin NPH, Human,, Isophane, (NOVOLIN N FLEXPEN) 100 UNIT/ML Kiwkpen Inject 15 Units into the skin 2 (two) times daily before a meal.   [DISCONTINUED] TRULICITY 8.89 VQ/9.4HW SOPN SMARTSIG:0.5 Milliliter(s) SUB-Q Once a Week (Patient not taking: Reported on 04/19/2022)   No  facility-administered encounter medications on file as of 04/19/2022.    ALLERGIES: Allergies  Allergen Reactions   Penicillins Hives   Amoxil [Amoxicillin] Hives    VACCINATION STATUS: Immunization History  Administered Date(s) Administered   DTaP 05/12/1999, 07/14/1999, 09/09/1999, 10/13/2000, 03/20/2003   HIB (PRP-OMP) 05/12/1999, 07/14/1999, 09/09/1999, 08/22/2000   Hepatitis A 03/09/2011   Hepatitis A, Ped/Adol-2 Dose 04/28/2014   Hepatitis B 1998/12/18, 05/12/1999, 08/23/1999   IPV 05/12/1999, 07/14/1999, 10/13/2000, 03/20/2003   Influenza,inj,Quad PF,6+ Mos 06/08/2016   MMR 08/22/2000, 03/20/2003   Meningococcal Conjugate 03/09/2011, 06/08/2016   Pneumococcal Conjugate-13 08/23/1999, 09/09/1999, 03/14/2000, 10/13/2000   Tdap 02/10/2010   Varicella 03/20/2003, 04/28/2014    Diabetes She presents for her initial diabetic visit. She has type 2 diabetes mellitus. Onset time: She was diagnosed at approximate age of 60, when she was admitted for hyperglycemic, hyperosmolar coma. There are no hypoglycemic associated symptoms. Pertinent negatives for hypoglycemia include no confusion, headaches, pallor or seizures. Associated symptoms include fatigue, polydipsia and polyuria. Pertinent negatives for diabetes include no chest pain and no polyphagia. There are no hypoglycemic complications. Symptoms are improving. (Recent hyperosmolar coma.  She has difficulty affording medications.) Risk factors for coronary artery disease include diabetes mellitus, obesity, family history, tobacco exposure and sedentary lifestyle. Current diabetic treatments: She is currently on glimepiride 4 mg p.o. twice daily, Novolin and 15 units twice a day, metformin 750 mg p.o. twice daily. Her weight is fluctuating minimally. She is following a generally unhealthy diet. When asked about meal planning, she reported none. She has not had a previous visit with a dietitian. She rarely participates in exercise. Her home  blood glucose trend is decreasing steadily. (After she was diagnosed with hyperosmolar, with blood glucose readings > 1200 mg per DL, and A1c of 13.2%, she was put on multiple medications with reported improvement in her symptoms.  She does not monitor blood glucose and did not bring any meter.) An ACE inhibitor/angiotensin II receptor blocker is not being taken. She does not see a podiatrist.Eye exam is not current.    Review of Systems  Constitutional:  Positive for fatigue. Negative for chills, fever and unexpected weight change.  HENT:  Negative for trouble swallowing and voice change.   Eyes:  Negative for visual disturbance.  Respiratory:  Negative for cough, shortness of breath and wheezing.   Cardiovascular:  Negative for chest pain, palpitations and leg swelling.  Gastrointestinal:  Negative for diarrhea, nausea and vomiting.  Endocrine: Positive for polydipsia and polyuria. Negative for cold intolerance, heat intolerance and polyphagia.  Musculoskeletal:  Negative for arthralgias and myalgias.  Skin:  Negative for color change, pallor, rash and wound.  Neurological:  Negative for seizures and headaches.  Psychiatric/Behavioral:  Negative for confusion and suicidal ideas.     Objective:       04/19/2022   10:49 AM 04/19/2022   10:09 AM 03/28/2022   12:36 PM  Vitals with BMI  Height 5' 4"  5' 4"    Weight 191 lbs 13 oz 191 lbs 13 oz   BMI 94.71 25.27   Systolic  129 290  Diastolic  76 80  Pulse  80 93    BP 112/76   Pulse 80   Ht 5' 4"  (1.626 m)   Wt 191 lb 12.8 oz (87 kg)   LMP 03/21/2022   BMI 32.92 kg/m   Wt Readings from Last 3 Encounters:  04/19/22 191 lb 12.8 oz (87 kg)  04/19/22 191 lb 12.8 oz (87 kg)  03/27/22 183 lb 12.8 oz (83.4 kg)     Physical Exam Constitutional:  Appearance: She is well-developed.  HENT:     Head: Normocephalic and atraumatic.  Neck:     Thyroid: No thyromegaly.     Trachea: No tracheal deviation.  Cardiovascular:     Rate  and Rhythm: Normal rate and regular rhythm.  Pulmonary:     Effort: Pulmonary effort is normal.     Breath sounds: Normal breath sounds.  Abdominal:     General: Bowel sounds are normal.     Palpations: Abdomen is soft.     Tenderness: There is no abdominal tenderness. There is no guarding.  Musculoskeletal:        General: Normal range of motion.     Cervical back: Normal range of motion and neck supple.  Skin:    General: Skin is warm and dry.     Coloration: Skin is not pale.     Findings: No erythema or rash.  Neurological:     Mental Status: She is alert and oriented to person, place, and time.     Cranial Nerves: No cranial nerve deficit.     Coordination: Coordination normal.     Deep Tendon Reflexes: Reflexes are normal and symmetric.  Psychiatric:        Judgment: Judgment normal.       CMP ( most recent) CMP     Component Value Date/Time   NA 139 03/28/2022 0329   NA 139 08/06/2021 1646   K 3.8 03/28/2022 0329   CL 111 03/28/2022 0329   CO2 23 03/28/2022 0329   GLUCOSE 230 (H) 03/28/2022 0329   BUN 11 03/28/2022 0329   BUN 11 08/06/2021 1646   CREATININE 0.53 03/28/2022 0329   CALCIUM 8.4 (L) 03/28/2022 0329   PROT 7.9 03/27/2022 0947   PROT 7.7 08/06/2021 1646   ALBUMIN 3.4 (L) 03/28/2022 0329   ALBUMIN 5.1 (H) 08/06/2021 1646   AST 11 (L) 03/27/2022 0947   ALT 17 03/27/2022 0947   ALKPHOS 121 03/27/2022 0947   BILITOT 0.8 03/27/2022 0947   BILITOT 0.3 08/06/2021 1646   GFRNONAA >60 03/28/2022 0329   GFRAA 147 06/06/2018 1438     Diabetic Labs (most recent): Lab Results  Component Value Date   HGBA1C 13.2 (H) 03/27/2022   HGBA1C 9.2 (H) 12/16/2021   HGBA1C 8.9 (H) 09/09/2021     Lipid Panel ( most recent) Lipid Panel     Component Value Date/Time   CHOL 136 09/09/2021 1350   TRIG 119.0 09/09/2021 1350   HDL 33.00 (L) 09/09/2021 1350   CHOLHDL 4 09/09/2021 1350   VLDL 23.8 09/09/2021 1350   LDLCALC 79 09/09/2021 1350      Lab  Results  Component Value Date   TSH 1.660 06/06/2018      Assessment & Plan:   1. Type 2 diabetes mellitus with hyperglycemia, with long-term current use of insulin ([place])   - Shelby Rubio has currently uncontrolled symptomatic type 2 DM since  21 years of age. After she was diagnosed with hyperosmolar, with blood glucose readings > 1200 mg per DL, and A1c of 13.2%, she was put on multiple medications with reported improvement in her symptoms.  She does not monitor blood glucose and did not bring any meter. -Reviewed her most recent labs. - I had a long discussion with her about the possible risk factors and  the pathology behind its diabetes and its complications. -her diabetes is complicated by obesity, social constraints and she remains at a high risk for more acute  and chronic complications which include CAD, CVA, CKD, retinopathy, and neuropathy. These are all discussed in detail with her.  - I discussed all available options of managing her diabetes including de-escalation of medications. I have counseled her on Food as Medicine by adopting a Whole Food , Plant Predominant  ( WFPP) nutrition as recommended by [employer] of Lifestyle Medicine. Patient is encouraged to switch to  unprocessed or minimally processed  complex starch, adequate protein intake (mainly plant source), minimal liquid fat, plenty of fruits, and vegetables. -  she is advised to stick to a routine mealtimes to eat 3 complete meals a day and snack only when necessary ( to snack only to correct hypoglycemia BG <70 day time or <100 at night).   - she acknowledges that there is a room for improvement in her food and drink choices. - Further Specific Suggestion is made for her to avoid simple carbohydrates  from her diet including Cakes, Sweet Desserts, Ice Cream, Soda (diet and regular), Sweet Tea, Candies, Chips, Cookies, Store Bought Juices, Alcohol ,  Artificial Sweeteners,  Coffee Creamer, and "Sugar-free" Products.  This will help patient to have more stable blood glucose profile and potentially avoid unintended weight gain.  The following Lifestyle Medicine recommendations according to [facility]) were discussed and offered to patient and she agrees to start the journey:  A.  Whole Foods, Plant-based plate comprising of fruits and vegetables, plant-based proteins, whole-grain carbohydrates was discussed in detail with the patient.   A list for source of those nutrients were also provided to the patient.  Patient will use only water or unsweetened tea for hydration. B.  The need to stay away from risky substances including alcohol, smoking; obtaining 7 to 9 hours of restorative sleep, at least 150 minutes of moderate intensity exercise weekly, the importance of healthy social connections,  and stress reduction techniques were discussed. C.  A full color page of  Calorie density of various food groups per pound showing examples of each food groups was provided to the patient.  - she will be scheduled with Jearld Fenton, RDN, CDE for individualized diabetes education.  - I have approached her with the following individualized plan to manage  her diabetes and patient agrees:   - she will need insulin analogs in order for her to achieve control of diabetes to target.  I discussed and initiated basal insulin with a sample.  For Tresiba 20 units nightly, associated with monitoring of blood glucose 4 times a day-before meals and at bedtime and return in 1 week with her logs and visit for reevaluation.   She is advised to hold the NPH insulin for now.  She is also advised to discontinue Amaryl.  She is advised to continue metformin 750 mg p.o. twice daily. He is advised for strict monitoring of glucose 4 times a day-before meals and at bedtime.  - Adjustment parameters are given to her for hypo and hyperglycemia in writing. -This patient will benefit from a CGM.  I discussed and  prescribed the freestyle libre device for her. - she is encouraged to call clinic for blood glucose levels less than 70 or above 300 mg /dl. He may need work-up to properly classify her diabetes on subsequent visits.  - Specific targets for  A1c;  LDL, HDL,  and Triglycerides were discussed with the patient.  2) Blood Pressure /Hypertension:  her blood pressure is  controlled to target.  She is not on antihypertensive  medications for now.   3) Lipids/Hyperlipidemia:   Review of her recent lipid panel showed  controlled  LDL at 79 .  she  is not on statins.     4)  Weight/Diet:  Body mass index is 32.92 kg/m.  -   clearly complicating her diabetes care.   she is  a candidate for weight loss. I discussed with her the fact that loss of 5 - 10% of her  current body weight will have the most impact on her diabetes management.  The above detailed  ACLM recommendations for nutrition, exercise, sleep, social life, avoidance of risky substances, the need for restorative sleep   information will also detailed on discharge instructions.  5) Chronic Care/Health Maintenance:  -she  is not  on ACEI/ARB and Statin medications and  is encouraged to initiate and continue to follow up with Ophthalmology, Dentist,  Podiatrist at least yearly or according to recommendations, and advised to  quit smoking. I have recommended yearly flu vaccine and pneumonia vaccine at least every 5 years; moderate intensity exercise for up to 150 minutes weekly; and  sleep for 7- 9 hours a day.  - she is  advised to maintain close follow up with Jeanie Sewer, NP for primary care needs, as well as her other providers for optimal and coordinated care.   I spent 65 minutes in the care of the patient today including review of labs from [facility], Lipids, Thyroid Function, Hematology (current and previous including abstractions from other facilities); face-to-face time discussing  her blood glucose readings/logs, discussing hypoglycemia  and hyperglycemia episodes and symptoms, medications doses, her options of short and long term treatment based on the latest standards of care / guidelines;  discussion about incorporating lifestyle medicine;  and documenting the encounter. Risk reduction counseling performed per USPSTF guidelines to reduce  obesity and cardiovascular risk factors.      Please refer to Patient Instructions for Blood Glucose Monitoring and Insulin/Medications Dosing Guide"  in media tab for additional information. Please  also refer to " Patient Self Inventory" in the Media  tab for reviewed elements of pertinent patient history.  Shelby Rubio participated in the discussions, expressed understanding, and voiced agreement with the above plans.  All questions were answered to her satisfaction. she is encouraged to contact clinic should she have any questions or concerns prior to her return visit.   Follow up plan: - Return in about 1 week (around 04/26/2022), or she will see Kieth Brightly today, for F/U with Meter/CGM [facility] Only - no Labs.  Glade Lloyd, MD [facility] [address] [place], [place] [address] Phone: [phone]  Fax: [phone]    04/19/2022, 12:58 PM  This note was partially dictated with voice recognition software. Similar sounding words can be transcribed inadequately or may not  be corrected upon review.

## 2022-04-19 NOTE — Patient Instructions (Signed)

## 2022-04-19 NOTE — Patient Instructions (Addendum)
   [place] for patches and counseling for smoking cessation. Eat 3 balanced meals at times discussed Don't skip meals Take Medications as prescribed Call if you need help putting on your [place]. Increase plant based foods and cut out processed foods Cut out soda, sugary beverages. Watch portions of pastas and heavier carb vegetables. Get A1C down to 7% Avoid hypoglycemia.

## 2022-04-22 ENCOUNTER — Other Ambulatory Visit: Payer: Self-pay

## 2022-04-22 DIAGNOSIS — Z794 Long term (current) use of insulin: Secondary | ICD-10-CM

## 2022-04-22 MED ORDER — INSULIN DEGLUDEC 200 UNIT/ML ~~LOC~~ SOPN: 20.0000 [IU] | PEN_INJECTOR | Freq: Every evening | SUBCUTANEOUS | 0 refills | Status: DC

## 2022-04-26 ENCOUNTER — Ambulatory Visit: Payer: BC Managed Care – PPO | Admitting: Family

## 2022-04-26 ENCOUNTER — Ambulatory Visit: Payer: BC Managed Care – PPO | Admitting: Nutrition

## 2022-04-27 ENCOUNTER — Encounter: Payer: Self-pay | Admitting: Nutrition

## 2022-04-27 ENCOUNTER — Telehealth: Payer: Self-pay | Admitting: "Endocrinology

## 2022-04-27 ENCOUNTER — Encounter: Payer: Self-pay | Admitting: "Endocrinology

## 2022-04-27 ENCOUNTER — Ambulatory Visit (INDEPENDENT_AMBULATORY_CARE_PROVIDER_SITE_OTHER): Payer: BC Managed Care – PPO | Admitting: "Endocrinology

## 2022-04-27 ENCOUNTER — Encounter: Payer: BC Managed Care – PPO | Attending: Family | Admitting: Nutrition

## 2022-04-27 VITALS — BP 100/76 | HR 88 | Ht 64.0 in | Wt 189.0 lb

## 2022-04-27 VITALS — Ht 64.0 in | Wt 189.0 lb

## 2022-04-27 DIAGNOSIS — I152 Hypertension secondary to endocrine disorders: Secondary | ICD-10-CM | POA: Insufficient documentation

## 2022-04-27 DIAGNOSIS — Z794 Long term (current) use of insulin: Secondary | ICD-10-CM

## 2022-04-27 DIAGNOSIS — E1165 Type 2 diabetes mellitus with hyperglycemia: Secondary | ICD-10-CM

## 2022-04-27 DIAGNOSIS — F17200 Nicotine dependence, unspecified, uncomplicated: Secondary | ICD-10-CM | POA: Diagnosis not present

## 2022-04-27 DIAGNOSIS — E669 Obesity, unspecified: Secondary | ICD-10-CM | POA: Insufficient documentation

## 2022-04-27 DIAGNOSIS — E1169 Type 2 diabetes mellitus with other specified complication: Secondary | ICD-10-CM | POA: Insufficient documentation

## 2022-04-27 DIAGNOSIS — E1159 Type 2 diabetes mellitus with other circulatory complications: Secondary | ICD-10-CM | POA: Insufficient documentation

## 2022-04-27 DIAGNOSIS — Z720 Tobacco use: Secondary | ICD-10-CM | POA: Insufficient documentation

## 2022-04-27 NOTE — Patient Instructions (Signed)
                                     Advice for Weight Management  -For most of us the best way to lose weight is by diet management. Generally speaking, diet management means consuming less calories intentionally which over time brings about progressive weight loss.  This can be achieved more effectively by avoiding ultra processed carbohydrates, processed meats, unhealthy fats.    It is critically important to know your numbers: how much calorie you are consuming and how much calorie you need. More importantly, our carbohydrates sources should be unprocessed naturally occurring  complex starch food items.  It is always important to balance nutrition also by  appropriate intake of proteins (mainly plant-based), healthy fats/oils, plenty of fruits and vegetables.   -The American College of Lifestyle Medicine (ACL M) recommends nutrition derived mostly from Whole Food, Plant Predominant Sources example an apple instead of applesauce or apple pie. Eat Plenty of vegetables, Mushrooms, fruits, Legumes, Whole Grains, Nuts, seeds in lieu of processed meats, processed snacks/pastries red meat, poultry, eggs.  Use only water or unsweetened tea for hydration.  The College also recommends the need to stay away from risky substances including alcohol, smoking; obtaining 7-9 hours of restorative sleep, at least 150 minutes of moderate intensity exercise weekly, importance of healthy social connections, and being mindful of stress and seek help when it is overwhelming.    -Sticking to a routine mealtime to eat 3 meals a day and avoiding unnecessary snacks is shown to have a big role in weight control. Under normal circumstances, the only time we burn stored energy is when we are hungry, so allow  some hunger to take place- hunger means no food between appropriate meal times, only water.  It is not advisable to starve.   -It is better to avoid simple carbohydrates including:  Cakes, Sweet Desserts, Ice Cream, Soda (diet and regular), Sweet Tea, Candies, Chips, Cookies, Store Bought Juices, Alcohol in Excess of  1-2 drinks a day, Lemonade,  Artificial Sweeteners, Doughnuts, Coffee Creamers, "Sugar-free" Products, etc, etc.  This is not a complete list.....    -Consulting with certified diabetes educators is proven to provide you with the most accurate and current information on diet.  Also, you may be  interested in discussing diet options/exchanges , we can schedule a visit with Shelby Rubio, RDN, CDE for individualized nutrition education.  -Exercise: If you are able: 30 -60 minutes a day ,4 days a week, or 150 minutes of moderate intensity exercise weekly.    The longer the better if tolerated.  Combine stretch, strength, and aerobic activities.  If you were told in the past that you have high risk for cardiovascular diseases, or if you are currently symptomatic, you may seek evaluation by your heart doctor prior to initiating moderate to intense exercise programs.                                  Additional Care Considerations for Diabetes/Prediabetes   -Diabetes  is a chronic disease.  The most important care consideration is regular follow-up with your diabetes care provider with the goal being avoiding or delaying its complications and to take advantage of advances in medications and technology.  If appropriate actions are taken early enough, type 2 diabetes can even be   reversed.  Seek information from the right source.  - Whole Food, Plant Predominant Nutrition is highly recommended: Eat Plenty of vegetables, Mushrooms, fruits, Legumes, Whole Grains, Nuts, seeds in lieu of processed meats, processed snacks/pastries red meat, poultry, eggs as recommended by American College of  Lifestyle Medicine (ACLM).  -Type 2 diabetes is known to coexist with other important comorbidities such as high blood pressure and high cholesterol.  It is critical to control not only the  diabetes but also the high blood pressure and high cholesterol to minimize and delay the risk of complications including coronary artery disease, stroke, amputations, blindness, etc.  The good news is that this diet recommendation for type 2 diabetes is also very helpful for managing high cholesterol and high blood blood pressure.  - Studies showed that people with diabetes will benefit from a class of medications known as ACE inhibitors and statins.  Unless there are specific reasons not to be on these medications, the standard of care is to consider getting one from these groups of medications at an optimal doses.  These medications are generally considered safe and proven to help protect the heart and the kidneys.    - People with diabetes are encouraged to initiate and maintain regular follow-up with eye doctors, foot doctors, dentists , and if necessary heart and kidney doctors.     - It is highly recommended that people with diabetes quit smoking or stay away from smoking, and get yearly  flu vaccine and pneumonia vaccine at least every 5 years.  See above for additional recommendations on exercise, sleep, stress management , and healthy social connections.      

## 2022-04-27 NOTE — Telephone Encounter (Signed)
Shelby Rubio just had her [place] 3 put on today. After lunch her reading was 60 on the [place] and 111 on the Glucometer. Please advise

## 2022-04-27 NOTE — Progress Notes (Signed)
Medical Nutrition Therapy  Appointment Start time:  1000 appointment End time:  1015  Primary concerns today: New Type 2 DM Walk in from Dr. Fransico Him  Referral diagnosis: E11.8 Preferred learning style:  No preference  Learning readiness: Change in progress    NUTRITION ASSESSMENT  Saw Dr. Fransico Him today. 7 days avg BS"s: 118 mg/dl. 14 day 127 mg/dl and 30 day 154 mg/dl. Changes made:   She got the [place] and has found it very helpful. Has been eating more vegetables and healthier carb choices. More fruit. Drinking water. Cut out sweets and desserts. Cut down on portions, especially pasta. Not binge eating. Has been using whole wheat bread. Is taking her meals to work now. Been doing  more meal prepping.  Feels a lot better. Less bloated. And more energy. Lost 2 lbs. Going to reduce insulin from 20 units down to 10 units per Dr. Fransico Him. Metformin 750 mg BID.  Anthropometrics  Wt Readings from Last 3 Encounters:  04/27/22 189 lb (85.7 kg)  04/27/22 189 lb (85.7 kg)  04/19/22 191 lb 12.8 oz (87 kg)   Ht Readings from Last 3 Encounters:  04/27/22 5\' 4"  (1.626 m)  04/27/22 5\' 4"  (1.626 m)  04/19/22 5\' 4"  (1.626 m)   Body mass index is 32.44 kg/m. @BMIFA @ Facility age limit for growth %iles is 20 years. Facility age limit for growth %iles is 20 years.    Clinical Medical Hx: See chart Medications: Tresiba 20 units, Metformin 750 mg BID Labs:  Lab Results  Component Value Date   HGBA1C 13.2 (H) 03/27/2022      Latest Ref Rng & Units 03/28/2022    3:29 AM 03/27/2022   12:02 PM 03/27/2022    9:47 AM  CMP  Glucose 70 - 99 mg/dL 03/29/2022  03/30/2022  03/29/2022   BUN 6 - 20 mg/dL 11  10  14    Creatinine 0.44 - 1.00 mg/dL 03/29/2022  008  676   Sodium 135 - 145 mmol/L 139  138  131   Potassium 3.5 - 5.1 mmol/L 3.8  3.3  3.9   Chloride 98 - 111 mmol/L 111  105  96   CO2 22 - 32 mmol/L 23  22  23    Calcium 8.9 - 10.3 mg/dL 8.4  9.0  9.8   Total Protein 6.5 - 8.1 g/dL   7.9   Total Bilirubin 0.3 - 1.2  mg/dL   0.8   Alkaline Phos 38 - 126 U/L   121   AST 15 - 41 U/L   11   ALT 0 - 44 U/L   17    Lipid Panel     Component Value Date/Time   CHOL 136 09/09/2021 1350   TRIG 119.0 09/09/2021 1350   HDL 33.00 (L) 09/09/2021 1350   CHOLHDL 4 09/09/2021 1350   VLDL 23.8 09/09/2021 1350   LDLCALC 79 09/09/2021 1350     Notable Signs/Symptoms: increased thirst, weight loss, blurry vision, fatigue, frequent urination.  Lifestyle & Dietary Hx Works at 11/07/2021   Estimated daily fluid intake: 40 oz Supplements:  Sleep: varies Stress / self-care: her DM Current average weekly physical activity: walks on job a lot  24-Hr Dietary Recall B) Egg whites and toast, water or oatmeal L) salad, chicken or tuna, apple, water D) Hot dog, water; usually meat and 2 veggies.   Estimated Energy Needs Calories: 1500 Carbohydrate: 170g Protein: 112g Fat: 42g   NUTRITION DIAGNOSIS  NB-1.1 Food and nutrition-related  knowledge deficit As related to Diabetes Type 2.  As evidenced by A1C 13.%.   NUTRITION INTERVENTION  Nutrition education (E-1) on the following topics:  Nutrition and Diabetes education provided on My Plate, CHO counting, meal planning, portion sizes, timing of meals, avoiding snacks between meals unless having a low blood sugar, target ranges for A1C and blood sugars, signs/symptoms and treatment of hyper/hypoglycemia, monitoring blood sugars, taking medications as prescribed, benefits of exercising 30 minutes per day and prevention of complications of DM.  Lifestyle Medicine  - Whole Food, Plant Predominant Nutrition is highly recommended: Eat Plenty of vegetables, Mushrooms, fruits, Legumes, Whole Grains, Nuts, seeds in lieu of processed meats, processed snacks/pastries red meat, poultry, eggs.    -It is better to avoid simple carbohydrates including: Cakes, Sweet Desserts, Ice Cream, Soda (diet and regular), Sweet Tea, Candies, Chips, Cookies, Store Bought Juices,  Alcohol in Excess of  1-2 drinks a day, Lemonade,  Artificial Sweeteners, Doughnuts, Coffee Creamers, "Sugar-free" Products, etc, etc.  This is not a complete list.....  Exercise: If you are able: 30 -60 minutes a day ,4 days a week, or 150 minutes a week.  The longer the better.  Combine stretch, strength, and aerobic activities.  If you were told in the past that you have high risk for cardiovascular diseases, you may seek evaluation by your heart doctor prior to initiating moderate to intense exercise programs.   Handouts Provided Include  Lifestyle Medicine Know your numbers  Learning Style & Readiness for Change Teaching method utilized: Visual & Auditory  Demonstrated degree of understanding via: Teach Back  Barriers to learning/adherence to lifestyle change: none  Goals Established by Pt  1800 QUIT NOW for patches and counseling for smoking cessation. Eat 3 balanced meals at times discussed Don't skip meals Meal Plan and take meals to work. Take Medications as prescribed Call if you need help putting on your [place]. Increase plant based foods and cut out processed foods Cut out soda, sugary beverages. Watch portions of pastas and heavier carb vegetables. Get A1C down to 7% Avoid hypoglycemia.   MONITORING & EVALUATION Dietary intake, weekly physical activity, and blood sugars in 9 weeks.  Next Steps  Patient is to Work on meal planning and taking meals to work.

## 2022-04-27 NOTE — Patient Instructions (Addendum)
Goals  Work on eating healthier carbs in [place]. Increase different vegetables Keep drinking water Continue to eat meals on time. Lose 2lbs or so per month. Try to do fullplatelivng. Org program.

## 2022-04-27 NOTE — Progress Notes (Signed)
Endocrinology Consult Note       04/27/2022, 4:27 PM   Subjective:    Patient ID: Shelby Rubio, female    DOB: 15-Apr-2001.  Shelby Rubio is being seen in consultation for management of currently uncontrolled symptomatic diabetes requested by  Jeanie Sewer, NP.   Past Medical History:  Diagnosis Date   Abnormal menses 06/06/2018   Anxiety 2011?   When diagnosed depressed   Asthma    When i was little   Behavior disorder    Diabetes mellitus without complication ([place]) 95/1884   Type 2   Dizziness 03/01/2019   History of hypoglycemia 03/01/2019   Lower abdominal pain 06/06/2018   Refused influenza vaccine 06/06/2018    Past Surgical History:  Procedure Laterality Date   ADENOIDECTOMY     TONSILLECTOMY     tubes in ears      Social History   Socioeconomic History   Marital status: Single    Spouse name: Not on file   Number of children: Not on file   Years of education: Not on file   Highest education level: Not on file  Occupational History   Not on file  Tobacco Use   Smoking status: Every Day    Packs/day: 1.00    Years: 4.00    Total pack years: 4.00    Types: Cigarettes   Smokeless tobacco: Never  Vaping Use   Vaping Use: Some days  Substance and Sexual Activity   Alcohol use: No   Drug use: Never   Sexual activity: Yes    Birth control/protection: Abstinence, Condom, I.U.D.  Other Topics Concern   Not on file  Social History Narrative   Not on file   Social Determinants of Health   Financial Resource Strain: Not on file  Food Insecurity: Not on file  Transportation Needs: Not on file  Physical Activity: Not on file  Stress: Not on file  Social Connections: Not on file    Family History  Problem Relation Age of Onset   Diabetes Mother    Hypertension Mother    Obesity Mother    Asthma Sister    Asthma Brother    Diabetes Other    ADD / ADHD Father     Alcohol abuse Father    Anxiety disorder Father    Arthritis Father    Depression Father    Stroke Father    Arthritis Maternal Grandfather    Asthma Maternal Grandfather    Cancer Maternal Grandfather    Diabetes Maternal Grandfather    Hearing loss Maternal Grandfather    Kidney disease Maternal Grandfather    Arthritis Maternal Grandmother    Miscarriages / Stillbirths Maternal Grandmother    ADD / ADHD Paternal Grandfather    Anxiety disorder Paternal Grandfather    Arthritis Paternal Grandfather    Depression Paternal Grandfather    Diabetes Paternal Grandfather    Early death Paternal Grandfather    Heart disease Paternal Grandfather    Anxiety disorder Paternal Grandmother    Depression Paternal Grandmother    Diabetes Paternal Grandmother    Obesity Paternal Grandmother  ADD / ADHD Brother    Anxiety disorder Brother    Asthma Brother    Depression Brother    ADD / ADHD Brother    ADD / ADHD Paternal Uncle    Anxiety disorder Paternal Uncle    Arthritis Paternal Uncle    Asthma Paternal Uncle    Depression Paternal Uncle    Diabetes Paternal Uncle    Hypertension Paternal Uncle    Obesity Paternal Uncle    ADD / ADHD Paternal Uncle    Anxiety disorder Paternal Uncle    Arthritis Paternal Uncle    Asthma Paternal Uncle    Depression Paternal Uncle    Diabetes Paternal Uncle    Hypertension Paternal Uncle    Obesity Paternal Uncle    Anxiety disorder Sister    Asthma Sister    Depression Sister    Miscarriages / Stillbirths Sister    Anxiety disorder Paternal Uncle    Depression Paternal Uncle    Diabetes Paternal Uncle    Learning disabilities Paternal Uncle    Obesity Paternal Uncle    Diabetes Paternal Uncle    Kidney disease Paternal Uncle    Obesity Paternal Uncle    Miscarriages / Stillbirths Maternal Aunt    Obesity Maternal Aunt     Outpatient Encounter Medications as of 04/27/2022  Medication Sig   Insulin Glargine (BASAGLAR KWIKPEN [place])  Inject 10 units of lipase into the skin at bedtime.   blood glucose meter kit and supplies KIT Dispense based on patient and insurance preference. Use up to four times daily as directed.   cetirizine (ZYRTEC) 10 MG tablet Take 1 tablet (10 mg total) by mouth daily.   Continuous Blood Gluc Sensor (FREESTYLE LIBRE 3 SENSOR) MISC 1 Piece by Does not apply route every 14 (fourteen) days. Place 1 sensor on the skin every 14 days. Use to check glucose continuously   fluticasone (FLONASE) 50 MCG/ACT nasal spray Place 2 sprays into both nostrils daily.   Insulin Pen Needle (BD PEN NEEDLE NANO U/F) 32G X 4 MM MISC 1 each by Does not apply route 4 (four) times daily.   levonorgestrel (MIRENA) 20 MCG/DAY IUD 1 each by Intrauterine route once.   lisinopril (ZESTRIL) 5 MG tablet Take 1 tablet (5 mg total) by mouth daily.   metFORMIN (GLUCOPHAGE-XR) 750 MG 24 hr tablet Take 1 tablet (750 mg total) by mouth 2 (two) times daily after a meal.   nicotine (NICODERM CQ - DOSED IN MG/24 HOURS) 21 mg/24hr patch Place 1 patch (21 mg total) onto the skin daily. (Patient not taking: Reported on 04/19/2022)   [DISCONTINUED] insulin degludec (TRESIBA) 200 UNIT/ML FlexTouch Pen Inject 20 Units into the skin at bedtime.   No facility-administered encounter medications on file as of 04/27/2022.    ALLERGIES: Allergies  Allergen Reactions   Penicillins Hives   Amoxil [Amoxicillin] Hives    VACCINATION STATUS: Immunization History  Administered Date(s) Administered   DTaP 05/12/1999, 07/14/1999, 09/09/1999, 10/13/2000, 03/20/2003   HIB (PRP-OMP) 05/12/1999, 07/14/1999, 09/09/1999, 08/22/2000   Hepatitis A 03/09/2011   Hepatitis A, Ped/Adol-2 Dose 04/28/2014   Hepatitis B [birth year]1999, 05/12/1999, 08/23/1999   IPV 05/12/1999, 07/14/1999, 10/13/2000, 03/20/2003   Influenza,inj,Quad PF,6+ Mos 06/08/2016   MMR 08/22/2000, 03/20/2003   Meningococcal Conjugate 03/09/2011, 06/08/2016   Pneumococcal Conjugate-13 08/23/1999,  09/09/1999, 03/14/2000, 10/13/2000   Tdap 02/10/2010   Varicella 03/20/2003, 04/28/2014    Diabetes She presents for her follow-up diabetic visit. She has type 2 diabetes mellitus. Onset time:  She was diagnosed at approximate age of 35, when she was admitted for hyperglycemic, hyperosmolar coma. Her disease course has been improving. There are no hypoglycemic associated symptoms. Pertinent negatives for hypoglycemia include no confusion, headaches, pallor or seizures. Associated symptoms include fatigue. Pertinent negatives for diabetes include no chest pain, no polydipsia, no polyphagia and no polyuria. There are no hypoglycemic complications. Symptoms are improving. (Recent hyperosmolar coma.  She has difficulty affording medications.) Risk factors for coronary artery disease include diabetes mellitus, obesity, family history, tobacco exposure and sedentary lifestyle. Current diabetic treatments: She is currently on glimepiride 4 mg p.o. twice daily, Novolin and 15 units twice a day, metformin 750 mg p.o. twice daily. Her weight is fluctuating minimally. She is following a generally unhealthy diet. When asked about meal planning, she reported none. She has not had a previous visit with a dietitian. She rarely participates in exercise. Her home blood glucose trend is decreasing steadily. Her overall blood glucose range is 110-130 mg/dl. (She presents with significant improvement in her glycemic profile average blood glucose of 118 for the last 7 days, 127 for the last 14 days.    Her recent history is such that  after she was diagnosed with hyperosmolar, with blood glucose readings > 1200 mg per DL, and A1c of 13.2%. She brought her freestyle libre device with her for first application.  She remains on Basaglar 20 units nightly, metformin 750 mg p.o. twice daily. ) An ACE inhibitor/angiotensin II receptor blocker is not being taken. She does not see a podiatrist.Eye exam is not current.    Review of  Systems  Constitutional:  Positive for fatigue. Negative for chills, fever and unexpected weight change.  HENT:  Negative for trouble swallowing and voice change.   Eyes:  Negative for visual disturbance.  Respiratory:  Negative for cough, shortness of breath and wheezing.   Cardiovascular:  Negative for chest pain, palpitations and leg swelling.  Gastrointestinal:  Negative for diarrhea, nausea and vomiting.  Endocrine: Negative for cold intolerance, heat intolerance, polydipsia, polyphagia and polyuria.  Musculoskeletal:  Negative for arthralgias and myalgias.  Skin:  Negative for color change, pallor, rash and wound.  Neurological:  Negative for seizures and headaches.  Psychiatric/Behavioral:  Negative for confusion and suicidal ideas.     Objective:       04/27/2022    9:59 AM 04/27/2022    9:03 AM 04/19/2022   10:49 AM  Vitals with BMI  Height _0  _1  _2   Weight 189 lbs 189 lbs 191 lbs 13 oz  BMI 32.43 01.02 72.53  Systolic  664   Diastolic  76   Pulse  88     BP 100/76   Pulse 88   Ht _3  (1.626 m)   Wt 189 lb (85.7 kg)   LMP 03/21/2022   BMI 32.44 kg/m   Wt Readings from Last 3 Encounters:  04/27/22 189 lb (85.7 kg)  04/27/22 189 lb (85.7 kg)  04/19/22 191 lb 12.8 oz (87 kg)         CMP ( most recent) CMP     Component Value Date/Time   NA 139 03/28/2022 0329   NA 139 08/06/2021 1646   K 3.8 03/28/2022 0329   CL 111 03/28/2022 0329   CO2 23 03/28/2022 0329   GLUCOSE 230 (H) 03/28/2022 0329   BUN 11 03/28/2022 0329   BUN 11 08/06/2021 1646   CREATININE 0.53 03/28/2022 0329   CALCIUM 8.4 (L) 03/28/2022 0329  PROT 7.9 03/27/2022 0947   PROT 7.7 08/06/2021 1646   ALBUMIN 3.4 (L) 03/28/2022 0329   ALBUMIN 5.1 (H) 08/06/2021 1646   AST 11 (L) 03/27/2022 0947   ALT 17 03/27/2022 0947   ALKPHOS 121 03/27/2022 0947   BILITOT 0.8 03/27/2022 0947   BILITOT 0.3 08/06/2021 1646   GFRNONAA >60 03/28/2022 0329   GFRAA 147 06/06/2018 1438      Diabetic Labs (most recent): Lab Results  Component Value Date   HGBA1C 13.2 (H) 03/27/2022   HGBA1C 9.2 (H) 12/16/2021   HGBA1C 8.9 (H) 09/09/2021     Lipid Panel ( most recent) Lipid Panel     Component Value Date/Time   CHOL 136 09/09/2021 1350   TRIG 119.0 09/09/2021 1350   HDL 33.00 (L) 09/09/2021 1350   CHOLHDL 4 09/09/2021 1350   VLDL 23.8 09/09/2021 1350   LDLCALC 79 09/09/2021 1350      Lab Results  Component Value Date   TSH 1.660 06/06/2018      Assessment & Plan:   1. Type 2 diabetes mellitus with hyperglycemia, with long-term current use of insulin ([place])   - Shelby Rubio has currently uncontrolled symptomatic type 2 DM since  21 years of age.She presents with significant improvement in her glycemic profile average blood glucose of 118 for the last 7 days, 127 for the last 14 days.    Her recent history is such that  after she was diagnosed with hyperosmolar, with blood glucose readings > 1200 mg per DL, and A1c of 13.2%. She brought her freestyle libre device with her for first application.  She remains on Basaglar 20 units nightly, metformin 750 mg p.o. twice daily. -Reviewed her most recent labs. - I had a long discussion with her about the possible risk factors and  the pathology behind its diabetes and its complications. -her diabetes is complicated by obesity, social constraints and she remains at a high risk for more acute and chronic complications which include CAD, CVA, CKD, retinopathy, and neuropathy. These are all discussed in detail with her.  - I discussed all available options of managing her diabetes including de-escalation of medications. I have counseled her on Food as Medicine by adopting a Whole Food , Plant Predominant  ( WFPP) nutrition as recommended by [employer] of Lifestyle Medicine. Patient is encouraged to switch to  unprocessed or minimally processed  complex starch, adequate protein intake (mainly plant source), minimal  liquid fat, plenty of fruits, and vegetables. -  she is advised to stick to a routine mealtimes to eat 3 complete meals a day and snack only when necessary ( to snack only to correct hypoglycemia BG <70 day time or <100 at night).   - she acknowledges that there is a room for improvement in her food and drink choices. - Suggestion is made for her to avoid simple carbohydrates  from her diet including Cakes, Sweet Desserts, Ice Cream, Soda (diet and regular), Sweet Tea, Candies, Chips, Cookies, Store Bought Juices, Alcohol , Artificial Sweeteners,  Coffee Creamer, and "Sugar-free" Products, Lemonade. This will help patient to have more stable blood glucose profile and potentially avoid unintended weight gain.  The following Lifestyle Medicine recommendations according to [place]  [facility]) were discussed and and offered to patient and she  agrees to start the journey:  A. Whole Foods, Plant-Based Nutrition comprising of fruits and vegetables, plant-based proteins, whole-grain carbohydrates was discussed in detail with the patient.  A list for source of those nutrients were also provided to the patient.  Patient will use only water or unsweetened tea for hydration. B.  The need to stay away from risky substances including alcohol, smoking; obtaining 7 to 9 hours of restorative sleep, at least 150 minutes of moderate intensity exercise weekly, the importance of healthy social connections,  and stress management techniques were discussed. C.  A full color page of  Calorie density of various food groups per pound showing examples of each food groups was provided to the patient.   - she has been  scheduled with Jearld Fenton, RDN, CDE for individualized diabetes education.  - I have approached her with the following individualized plan to manage  her diabetes and patient agrees:   - she he is advised to lower her Basaglar to 10 units nightly, continue to use her CGM  continuously.     She is advised to continue metformin 750 mg p.o. twice daily. He is advised for strict monitoring of glucose 4 times a day-before meals and at bedtime.  - she is encouraged to call clinic for blood glucose levels less than 70 or above 200 mg /dl. He may need work-up to properly classify her diabetes on subsequent visits.  - Specific targets for  A1c;  LDL, HDL,  and Triglycerides were discussed with the patient.  2) Blood Pressure /Hypertension:  her blood pressure is  controlled to target.  She is not on antihypertensive medications for now. The patient was counseled on the dangers of tobacco use, and was advised to quit.  Reviewed strategies to maximize success, including removing cigarettes and smoking materials from environment.   3) Lipids/Hyperlipidemia:   Review of her recent lipid panel showed  controlled  LDL at 79 .  she  is not on statins.     4)  Weight/Diet:  Body mass index is 32.44 kg/m.  -   clearly complicating her diabetes care.   she is  a candidate for weight loss. I discussed with her the fact that loss of 5 - 10% of her  current body weight will have the most impact on her diabetes management.  The above detailed  ACLM recommendations for nutrition, exercise, sleep, social life, avoidance of risky substances, the need for restorative sleep   information will also detailed on discharge instructions.  5) Chronic Care/Health Maintenance:  -she  is not  on ACEI/ARB and Statin medications and  is encouraged to initiate and continue to follow up with Ophthalmology, Dentist,  Podiatrist at least yearly or according to recommendations, and advised to  quit smoking. I have recommended yearly flu vaccine and pneumonia vaccine at least every 5 years; moderate intensity exercise for up to 150 minutes weekly; and  sleep for 7- 9 hours a day.  - she is  advised to maintain close follow up with Jeanie Sewer, NP for primary care needs, as well as her other  providers for optimal and coordinated care.  I spent 35 minutes in the care of the patient today including review of labs from [place], Lipids, Thyroid Function, Hematology (current and previous including abstractions from other facilities); face-to-face time discussing  her blood glucose readings/logs, discussing hypoglycemia and hyperglycemia episodes and symptoms, medications doses, her options of short and long term treatment based on the latest standards of care / guidelines;  discussion about incorporating lifestyle medicine;  and documenting the encounter. Risk reduction counseling performed per USPSTF guidelines to reduce obesity and cardiovascular risk factors.  Please refer to Patient Instructions for Blood Glucose Monitoring and Insulin/Medications Dosing Guide"  in media tab for additional information. Please  also refer to " Patient Self Inventory" in the Media  tab for reviewed elements of pertinent patient history.  Shelby Rubio participated in the discussions, expressed understanding, and voiced agreement with the above plans.  All questions were answered to her satisfaction. she is encouraged to contact clinic should she have any questions or concerns prior to her return visit.   Follow up plan: - Return in about 9 weeks (around 06/29/2022) for Bring Meter/CGM Device/Logs- A1c in Office.  Glade Lloyd, MD [facility] [address] [place], [place] [address] Phone: [phone]  Fax: [phone]    04/27/2022, 4:27 PM  This note was partially dictated with voice recognition software. Similar sounding words can be transcribed inadequately or may not  be corrected upon review.

## 2022-04-28 NOTE — Telephone Encounter (Signed)
Discussed with pt, understanding voiced. 

## 2022-05-03 ENCOUNTER — Ambulatory Visit (INDEPENDENT_AMBULATORY_CARE_PROVIDER_SITE_OTHER): Payer: BC Managed Care – PPO | Admitting: Family

## 2022-05-03 ENCOUNTER — Encounter: Payer: Self-pay | Admitting: Family

## 2022-05-03 VITALS — BP 112/82 | HR 98 | Temp 97.5°F | Ht 64.0 in | Wt 181.1 lb

## 2022-05-03 DIAGNOSIS — E669 Obesity, unspecified: Secondary | ICD-10-CM

## 2022-05-03 DIAGNOSIS — I152 Hypertension secondary to endocrine disorders: Secondary | ICD-10-CM | POA: Diagnosis not present

## 2022-05-03 DIAGNOSIS — E1169 Type 2 diabetes mellitus with other specified complication: Secondary | ICD-10-CM | POA: Diagnosis not present

## 2022-05-03 DIAGNOSIS — E1159 Type 2 diabetes mellitus with other circulatory complications: Secondary | ICD-10-CM

## 2022-05-03 NOTE — Progress Notes (Signed)
Patient ID: Shelby Rubio, female    DOB: [birth year], 21 y.o.   MRN: [id number]  Chief Complaint  Patient presents with   Follow-up    2 month follow-up Fasting      HPI: Hypertension: Patient is currently maintained on the following medications for blood pressure: Lisinopril. Failed meds include: none. Patient reports good compliance with blood pressure medications. Patient denies chest pain, headaches, shortness of breath or swelling. T2DM: Pt is currently maintained on the following medications for diabetes: Metformin Denies polyuria/polydipsia/polyphagia Denies hypoglycemia Home glucose readings range: fasting BS has been 90-110. Before lunch & dinner it runs 90-140, qhs 100 Recently in hospital for Hyperglycemia - 03/27/2022 & A1C up to 13.2. - pt states she started forgetting her Metformin & Glipizide and then just stopped taking it. Referred to an ENDO in [place] - was on insulin but causing low readings so he told her to d/c and monitor her CBGs before meals & qhs - has f/u in Dec.  Assessment & Plan:   Problem List Items Addressed This Visit       Cardiovascular and Mediastinum   Hypertension associated with diabetes ([place])    chronic taking Lisinopril qd BP in good range no refill needed today f/u in 6 mos        Endocrine   Type 2 diabetes mellitus with obesity ([place]) - Primary    chronic - Unstable recent hospital stay for hyperglycemia, pt stopped taking her meds - denies SE, she just kept forgetting & the gave up trying to take them- A1C up to 13.2 pt under ENDO care in [place] - d/c from hosp on glimeperide & insulin both d/c by ENDO d/t hypoglycemia at home reports taking Metformin 755m bid and CBGs wnl - pt has LEast Spencersensor reinforced need for daily compliance w/medication and monitoring CBGs as directed  advised she can call for guidance first to ENDO or me if no response re: abnormal CBGs       Subjective:    Outpatient Medications Prior to  Visit  Medication Sig Dispense Refill   cetirizine (ZYRTEC) 10 MG tablet Take 1 tablet (10 mg total) by mouth daily. 30 tablet 3   Continuous Blood Gluc Sensor (FREESTYLE LIBRE 3 SENSOR) MISC 1 Piece by Does not apply route every 14 (fourteen) days. Place 1 sensor on the skin every 14 days. Use to check glucose continuously 2 each 2   fluticasone (FLONASE) 50 MCG/ACT nasal spray Place 2 sprays into both nostrils daily. 16 g 3   levonorgestrel (MIRENA) 20 MCG/DAY IUD 1 each by Intrauterine route once.     lisinopril (ZESTRIL) 5 MG tablet Take 1 tablet (5 mg total) by mouth daily. 90 tablet 3   metFORMIN (GLUCOPHAGE-XR) 750 MG 24 hr tablet Take 1 tablet (750 mg total) by mouth 2 (two) times daily after a meal. 180 tablet 3   nicotine (NICODERM CQ - DOSED IN MG/24 HOURS) 21 mg/24hr patch Place 1 patch (21 mg total) onto the skin daily. 28 patch 0   blood glucose meter kit and supplies KIT Dispense based on patient and insurance preference. Use up to four times daily as directed. 1 each 11   Insulin Glargine (BASAGLAR KWIKPEN [place]) Inject 10 units of lipase into the skin at bedtime.     Insulin Pen Needle (BD PEN NEEDLE NANO U/F) 32G X 4 MM MISC 1 each by Does not apply route 4 (four) times daily. 100 each 2   No facility-administered medications  prior to visit.   Past Medical History:  Diagnosis Date   Abnormal menses 06/06/2018   Anxiety 2011?   When diagnosed depressed   Asthma    When i was little   Behavior disorder    Diabetes mellitus without complication ([place]) 00/3496   Type 2   Dizziness 03/01/2019   History of hypoglycemia 03/01/2019   Lower abdominal pain 06/06/2018   Refused influenza vaccine 06/06/2018   Past Surgical History:  Procedure Laterality Date   ADENOIDECTOMY     TONSILLECTOMY     tubes in ears     Allergies  Allergen Reactions   Penicillins Hives   Amoxil [Amoxicillin] Hives      Objective:    Physical Exam Vitals and nursing note reviewed.   Constitutional:      Appearance: Normal appearance.  Cardiovascular:     Rate and Rhythm: Normal rate and regular rhythm.  Pulmonary:     Effort: Pulmonary effort is normal.     Breath sounds: Normal breath sounds.  Musculoskeletal:        General: Normal range of motion.  Skin:    General: Skin is warm and dry.  Neurological:     Mental Status: She is alert.  Psychiatric:        Mood and Affect: Mood normal.        Behavior: Behavior normal.    BP 112/82   Pulse 98   Temp (!) 97.5 F (36.4 C) (Temporal)   Ht 5' 4"  (1.626 m)   Wt 181 lb 2 oz (82.2 kg)   SpO2 99%   BMI 31.09 kg/m  Wt Readings from Last 3 Encounters:  05/03/22 181 lb 2 oz (82.2 kg)  04/27/22 189 lb (85.7 kg)  04/27/22 189 lb (85.7 kg)       Jeanie Sewer, NP

## 2022-05-03 NOTE — Assessment & Plan Note (Addendum)
   chronic - Unstable  recent hospital stay for hyperglycemia, pt stopped taking her meds - denies SE, she just kept forgetting & the gave up trying to take them- A1C up to 13.2  pt under ENDO care in [place] - d/c from hosp on glimeperide & insulin both d/c by ENDO d/t hypoglycemia at home  reports taking Metformin 750mg  bid and CBGs wnl - pt has Libre sensor  reinforced need for daily compliance w/medication and monitoring CBGs as directed   advised she can call for guidance first to ENDO or me if no response re: abnormal CBGs

## 2022-05-03 NOTE — Assessment & Plan Note (Addendum)
   chronic  taking Lisinopril qd  BP in good range  no refill needed today  f/u in 6 mos

## 2022-05-04 ENCOUNTER — Ambulatory Visit: Payer: BC Managed Care – PPO | Admitting: Family

## 2022-05-08 ENCOUNTER — Other Ambulatory Visit: Payer: Self-pay | Admitting: Family

## 2022-05-08 DIAGNOSIS — E1169 Type 2 diabetes mellitus with other specified complication: Secondary | ICD-10-CM

## 2022-05-11 ENCOUNTER — Ambulatory Visit: Payer: BC Managed Care – PPO | Admitting: Nutrition

## 2022-05-16 ENCOUNTER — Other Ambulatory Visit: Payer: Self-pay | Admitting: Family

## 2022-05-16 DIAGNOSIS — E1169 Type 2 diabetes mellitus with other specified complication: Secondary | ICD-10-CM

## 2022-05-24 ENCOUNTER — Encounter: Payer: Self-pay | Admitting: Family

## 2022-05-26 NOTE — Telephone Encounter (Signed)
no, unfortunately not without an appointment -need to look in her ear -  if she can't get in with me or someone else in the office she should go to another provider, thanks.

## 2022-06-06 ENCOUNTER — Telehealth: Payer: Self-pay | Admitting: "Endocrinology

## 2022-06-06 NOTE — Telephone Encounter (Signed)
Discussed with pt, understanding voiced. 

## 2022-06-06 NOTE — Telephone Encounter (Signed)
New message    The patient voiced her blood sugar has been high due to head cold.   11/3-  234  11/4 - 266  11/5- no blood sugar reading   11/6-  217

## 2022-06-28 ENCOUNTER — Other Ambulatory Visit: Payer: Self-pay | Admitting: Family

## 2022-06-28 DIAGNOSIS — E669 Obesity, unspecified: Secondary | ICD-10-CM

## 2022-07-12 ENCOUNTER — Ambulatory Visit: Payer: BC Managed Care – PPO | Admitting: Nutrition

## 2022-07-12 ENCOUNTER — Ambulatory Visit: Payer: BC Managed Care – PPO | Admitting: "Endocrinology

## 2022-07-20 ENCOUNTER — Ambulatory Visit: Payer: BC Managed Care – PPO | Admitting: Nutrition

## 2022-07-22 ENCOUNTER — Ambulatory Visit (INDEPENDENT_AMBULATORY_CARE_PROVIDER_SITE_OTHER): Payer: BC Managed Care – PPO | Admitting: "Endocrinology

## 2022-07-22 ENCOUNTER — Encounter: Payer: Self-pay | Admitting: "Endocrinology

## 2022-07-22 VITALS — BP 110/72 | HR 76 | Ht 64.0 in | Wt 194.4 lb

## 2022-07-22 DIAGNOSIS — E1165 Type 2 diabetes mellitus with hyperglycemia: Secondary | ICD-10-CM

## 2022-07-22 DIAGNOSIS — Z794 Long term (current) use of insulin: Secondary | ICD-10-CM | POA: Diagnosis not present

## 2022-07-22 LAB — POCT GLYCOSYLATED HEMOGLOBIN (HGB A1C): HbA1c, POC (controlled diabetic range): 7.8 % — AB (ref 0.0–7.0)

## 2022-07-22 MED ORDER — GLIPIZIDE ER 2.5 MG PO TB24: 2.5000 mg | ORAL_TABLET | Freq: Every day | ORAL | 1 refills | Status: DC

## 2022-07-22 NOTE — Progress Notes (Signed)
Endocrinology Consult Note       07/22/2022, 9:00 AM   Subjective:    Patient ID: Shelby Rubio, female    DOB: [birth year].  Shelby Rubio is being seen in consultation for management of currently uncontrolled symptomatic diabetes requested by  Jeanie Sewer, NP.   Past Medical History:  Diagnosis Date   Abnormal menses 06/06/2018   Anxiety 2011?   When diagnosed depressed   Asthma    When i was little   Behavior disorder    Diabetes mellitus without complication ([place]) 29/9371   Type 2   Dizziness 03/01/2019   History of hypoglycemia 03/01/2019   Lower abdominal pain 06/06/2018   Refused influenza vaccine 06/06/2018    Past Surgical History:  Procedure Laterality Date   ADENOIDECTOMY     TONSILLECTOMY     tubes in ears      Social History   Socioeconomic History   Marital status: Single    Spouse name: Not on file   Number of children: Not on file   Years of education: Not on file   Highest education level: Not on file  Occupational History   Not on file  Tobacco Use   Smoking status: Every Day    Packs/day: 1.00    Years: 4.00    Total pack years: 4.00    Types: Cigarettes   Smokeless tobacco: Never  Vaping Use   Vaping Use: Some days  Substance and Sexual Activity   Alcohol use: No   Drug use: Never   Sexual activity: Yes    Birth control/protection: Abstinence, Condom, I.U.D.  Other Topics Concern   Not on file  Social History Narrative   Not on file   Social Determinants of Health   Financial Resource Strain: Not on file  Food Insecurity: Not on file  Transportation Needs: Not on file  Physical Activity: Not on file  Stress: Not on file  Social Connections: Not on file    Family History  Problem Relation Age of Onset   Diabetes Mother    Hypertension Mother    Obesity Mother    Asthma Sister    Asthma Brother    Diabetes Other    ADD / ADHD Father     Alcohol abuse Father    Anxiety disorder Father    Arthritis Father    Depression Father    Stroke Father    Arthritis Maternal Grandfather    Asthma Maternal Grandfather    Cancer Maternal Grandfather    Diabetes Maternal Grandfather    Hearing loss Maternal Grandfather    Kidney disease Maternal Grandfather    Arthritis Maternal Grandmother    Miscarriages / Stillbirths Maternal Grandmother    ADD / ADHD Paternal Grandfather    Anxiety disorder Paternal Grandfather    Arthritis Paternal Grandfather    Depression Paternal Grandfather    Diabetes Paternal Grandfather    Early death Paternal Grandfather    Heart disease Paternal Grandfather    Anxiety disorder Paternal Grandmother    Depression Paternal Grandmother    Diabetes Paternal Grandmother    Obesity Paternal Grandmother  ADD / ADHD Brother    Anxiety disorder Brother    Asthma Brother    Depression Brother    ADD / ADHD Brother    ADD / ADHD Paternal Uncle    Anxiety disorder Paternal Uncle    Arthritis Paternal Uncle    Asthma Paternal Uncle    Depression Paternal Uncle    Diabetes Paternal Uncle    Hypertension Paternal Uncle    Obesity Paternal Uncle    ADD / ADHD Paternal Uncle    Anxiety disorder Paternal Uncle    Arthritis Paternal Uncle    Asthma Paternal Uncle    Depression Paternal Uncle    Diabetes Paternal Uncle    Hypertension Paternal Uncle    Obesity Paternal Uncle    Anxiety disorder Sister    Asthma Sister    Depression Sister    Miscarriages / Stillbirths Sister    Anxiety disorder Paternal Uncle    Depression Paternal Uncle    Diabetes Paternal Uncle    Learning disabilities Paternal Uncle    Obesity Paternal Uncle    Diabetes Paternal Uncle    Kidney disease Paternal Uncle    Obesity Paternal Uncle    Miscarriages / Stillbirths Maternal Aunt    Obesity Maternal Aunt     Outpatient Encounter Medications as of 07/22/2022  Medication Sig   glipiZIDE (GLUCOTROL XL) 2.5 MG 24 hr  tablet Take 1 tablet (2.5 mg total) by mouth daily with breakfast.   cetirizine (ZYRTEC) 10 MG tablet Take 1 tablet (10 mg total) by mouth daily.   Continuous Blood Gluc Sensor (FREESTYLE LIBRE 3 SENSOR) MISC 1 Piece by Does not apply route every 14 (fourteen) days. Place 1 sensor on the skin every 14 days. Use to check glucose continuously   fluticasone (FLONASE) 50 MCG/ACT nasal spray Place 2 sprays into both nostrils daily.   levonorgestrel (MIRENA) 20 MCG/DAY IUD 1 each by Intrauterine route once.   lisinopril (ZESTRIL) 5 MG tablet Take 1 tablet (5 mg total) by mouth daily.   metFORMIN (GLUCOPHAGE-XR) 750 MG 24 hr tablet Take 1 tablet (750 mg total) by mouth 2 (two) times daily after a meal.   nicotine (NICODERM CQ - DOSED IN MG/24 HOURS) 21 mg/24hr patch Place 1 patch (21 mg total) onto the skin daily.   [DISCONTINUED] metFORMIN (GLUCOPHAGE-XR) 500 MG 24 hr tablet TAKE 1 TABLET (500 MG TOTAL) BY MOUTH 2 (TWO) TIMES DAILY AFTER A MEAL.   No facility-administered encounter medications on file as of 07/22/2022.    ALLERGIES: Allergies  Allergen Reactions   Penicillins Hives   Amoxil [Amoxicillin] Hives    VACCINATION STATUS: Immunization History  Administered Date(s) Administered   DTaP 05/12/1999, 07/14/1999, 09/09/1999, 10/13/2000, 03/20/2003   HIB (PRP-OMP) 05/12/1999, 07/14/1999, 09/09/1999, 08/22/2000   Hepatitis A 03/09/2011   Hepatitis A, Ped/Adol-2 Dose 04/28/2014   Hepatitis B 1999/05/26, 05/12/1999, 08/23/1999   IPV 05/12/1999, 07/14/1999, 10/13/2000, 03/20/2003   Influenza,inj,Quad PF,6+ Mos 06/08/2016   MMR 08/22/2000, 03/20/2003   Meningococcal Conjugate 03/09/2011, 06/08/2016   Pneumococcal Conjugate-13 08/23/1999, 09/09/1999, 03/14/2000, 10/13/2000   Tdap 02/10/2010   Varicella 03/20/2003, 04/28/2014    Diabetes She presents for her follow-up diabetic visit. She has type 2 diabetes mellitus. Onset time: She was diagnosed at approximate age of 37, when she was  admitted for hyperglycemic, hyperosmolar coma. Her disease course has been improving. There are no hypoglycemic associated symptoms. Pertinent negatives for hypoglycemia include no confusion, headaches, pallor or seizures. Associated symptoms include fatigue. Pertinent negatives  for diabetes include no chest pain, no polydipsia, no polyphagia and no polyuria. There are no hypoglycemic complications. Symptoms are improving. (Recent hyperosmolar coma.  She has difficulty affording medications.) Risk factors for coronary artery disease include diabetes mellitus, obesity, family history, tobacco exposure and sedentary lifestyle. Current diabetic treatments: She is currently on glimepiride 4 mg p.o. twice daily, Novolin and 15 units twice a day, metformin 750 mg p.o. twice daily. Her weight is fluctuating minimally. She is following a generally unhealthy diet. When asked about meal planning, she reported none. She has not had a previous visit with a dietitian. She rarely participates in exercise. Her home blood glucose trend is decreasing steadily. Her breakfast blood glucose range is generally 140-180 mg/dl. Her bedtime blood glucose range is generally 140-180 mg/dl. Her overall blood glucose range is 140-180 mg/dl. (She presents with significant improvement in her glycemic profile average blood glucose of 139-166 over the last 30 days.  Her point-of-care A1c is 7.8%, overall improving from 13.2%.  She came off of insulin in the interval.  She is currently on metformin 750 mg p.o. twice daily.    ) An ACE inhibitor/angiotensin II receptor blocker is not being taken. She does not see a podiatrist.Eye exam is not current.    Review of Systems  Constitutional:  Positive for fatigue. Negative for chills, fever and unexpected weight change.  HENT:  Negative for trouble swallowing and voice change.   Eyes:  Negative for visual disturbance.  Respiratory:  Negative for cough, shortness of breath and wheezing.    Cardiovascular:  Negative for chest pain, palpitations and leg swelling.  Gastrointestinal:  Negative for diarrhea, nausea and vomiting.  Endocrine: Negative for cold intolerance, heat intolerance, polydipsia, polyphagia and polyuria.  Musculoskeletal:  Negative for arthralgias and myalgias.  Skin:  Negative for color change, pallor, rash and wound.  Neurological:  Negative for seizures and headaches.  Psychiatric/Behavioral:  Negative for confusion and suicidal ideas.     Objective:       07/22/2022    8:29 AM 05/03/2022   11:08 AM 04/27/2022    9:59 AM  Vitals with BMI  Height _0  _1  _2   Weight 194 lbs 6 oz 181 lbs 2 oz 189 lbs  BMI 33.35 77.03 40.35  Systolic 248 185   Diastolic 72 82   Pulse 76 98     BP 110/72   Pulse 76   Ht _3  (1.626 m)   Wt 194 lb 6.4 oz (88.2 kg)   BMI 33.37 kg/m   Wt Readings from Last 3 Encounters:  07/22/22 194 lb 6.4 oz (88.2 kg)  05/03/22 181 lb 2 oz (82.2 kg)  04/27/22 189 lb (85.7 kg)         CMP ( most recent) CMP     Component Value Date/Time   NA 139 03/28/2022 0329   NA 139 08/06/2021 1646   K 3.8 03/28/2022 0329   CL 111 03/28/2022 0329   CO2 23 03/28/2022 0329   GLUCOSE 230 (H) 03/28/2022 0329   BUN 11 03/28/2022 0329   BUN 11 08/06/2021 1646   CREATININE 0.53 03/28/2022 0329   CALCIUM 8.4 (L) 03/28/2022 0329   PROT 7.9 03/27/2022 0947   PROT 7.7 08/06/2021 1646   ALBUMIN 3.4 (L) 03/28/2022 0329   ALBUMIN 5.1 (H) 08/06/2021 1646   AST 11 (L) 03/27/2022 0947   ALT 17 03/27/2022 0947   ALKPHOS 121 03/27/2022 0947   BILITOT 0.8 03/27/2022 0947  BILITOT 0.3 08/06/2021 1646   GFRNONAA >60 03/28/2022 0329   GFRAA 147 06/06/2018 1438     Diabetic Labs (most recent): Lab Results  Component Value Date   HGBA1C 7.8 (A) 07/22/2022   HGBA1C 13.2 (H) 03/27/2022   HGBA1C 9.2 (H) 12/16/2021     Lipid Panel ( most recent) Lipid Panel     Component Value Date/Time   CHOL 136 09/09/2021 1350   TRIG  119.0 09/09/2021 1350   HDL 33.00 (L) 09/09/2021 1350   CHOLHDL 4 09/09/2021 1350   VLDL 23.8 09/09/2021 1350   LDLCALC 79 09/09/2021 1350      Lab Results  Component Value Date   TSH 1.660 06/06/2018      Assessment & Plan:   1. Type 2 diabetes mellitus with hyperglycemia, with long-term current use of insulin ([place])   - Shelby Rubio has currently uncontrolled symptomatic type 2 DM since  21 years of age.  She presents with significant improvement in her glycemic profile average blood glucose of 139-166 over the last 30 days.  Her point-of-care A1c is 7.8%, overall improving from 13.2%.  She came off of insulin in the interval.  She is currently on metformin 750 mg p.o. twice daily.  She is switched to a glucometer from a CGM. -Reviewed her most recent labs. - I had a long discussion with her about the possible risk factors and  the pathology behind its diabetes and its complications. -her diabetes is complicated by obesity, social constraints and she remains at a high risk for more acute and chronic complications which include CAD, CVA, CKD, retinopathy, and neuropathy. These are all discussed in detail with her.  - I discussed all available options of managing her diabetes including de-escalation of medications. I have counseled her on Food as Medicine by adopting a Whole Food , Plant Predominant  ( WFPP) nutrition as recommended by [employer] of Lifestyle Medicine. Patient is encouraged to switch to  unprocessed or minimally processed  complex starch, adequate protein intake (mainly plant source), minimal liquid fat, plenty of fruits, and vegetables. -  she is advised to stick to a routine mealtimes to eat 3 complete meals a day and snack only when necessary ( to snack only to correct hypoglycemia BG <70 day time or <100 at night).     - she acknowledges that there is a room for improvement in her food and drink choices. - Suggestion is made for her to avoid simple  carbohydrates  from her diet including Cakes, Sweet Desserts, Ice Cream, Soda (diet and regular), Sweet Tea, Candies, Chips, Cookies, Store Bought Juices, Alcohol , Artificial Sweeteners,  Coffee Creamer, and "Sugar-free" Products, Lemonade. This will help patient to have more stable blood glucose profile and potentially avoid unintended weight gain.  The following Lifestyle Medicine recommendations according to [facility]  [facility]) were discussed and and offered to patient and she  agrees to start the journey:  A. Whole Foods, Plant-Based Nutrition comprising of fruits and vegetables, plant-based proteins, whole-grain carbohydrates was discussed in detail with the patient.   A list for source of those nutrients were also provided to the patient.  Patient will use only water or unsweetened tea for hydration. B.  The need to stay away from risky substances including alcohol, smoking; obtaining 7 to 9 hours of restorative sleep, at least 150 minutes of moderate intensity exercise weekly, the importance of healthy social connections,  and stress management techniques were discussed.  C.  A full color page of  Calorie density of various food groups per pound showing examples of each food groups was provided to the patient.    - she has been  scheduled with Jearld Fenton, RDN, CDE for individualized diabetes education.  - I have approached her with the following individualized plan to manage  her diabetes and patient agrees:   - she could stay off of insulin.  However, she will need some additional intervention on top of her metformin 750 mg p.o. twice daily.  I discussed initiated glipizide 2.5 mg XL p.o. daily at breakfast.  She is advised and willing to continue monitoring blood glucose twice a day-daily before breakfast and at bedtime.   - she is encouraged to call clinic for blood glucose levels less than 70 or above 200 mg /dl. He may need work-up to properly classify her  diabetes on subsequent visits.  - Specific targets for  A1c;  LDL, HDL,  and Triglycerides were discussed with the patient.  2) Blood Pressure /Hypertension:   Her blood pressure is controlled to target.  She is not on antihypertensive medications for now.  He is planning to quit smoking on the new year. The patient was counseled on the dangers of tobacco use, and was advised to quit.  Reviewed strategies to maximize success, including removing cigarettes and smoking materials from environment.   3) Lipids/Hyperlipidemia:   Review of her recent lipid panel showed  controlled  LDL at 79 .  she  is not on statins.   She will have fasting lipid panel before her next visit.  4)  Weight/Diet:  Body mass index is 33.37 kg/m.  -   clearly complicating her diabetes care.   she is  a candidate for weight loss. I discussed with her the fact that loss of 5 - 10% of her  current body weight will have the most impact on her diabetes management.  The above detailed  ACLM recommendations for nutrition, exercise, sleep, social life, avoidance of risky substances, the need for restorative sleep   information will also detailed on discharge instructions.  5) Chronic Care/Health Maintenance:  -she  is not  on ACEI/ARB and Statin medications and  is encouraged to initiate and continue to follow up with Ophthalmology, Dentist,  Podiatrist at least yearly or according to recommendations, and advised to  quit smoking. I have recommended yearly flu vaccine and pneumonia vaccine at least every 5 years; moderate intensity exercise for up to 150 minutes weekly; and  sleep for 7- 9 hours a day.  - she is  advised to maintain close follow up with Jeanie Sewer, NP for primary care needs, as well as her other providers for optimal and coordinated care.   I spent 28 minutes in the care of the patient today including review of labs from [place], Lipids, Thyroid Function, Hematology (current and previous including  abstractions from other facilities); face-to-face time discussing  her blood glucose readings/logs, discussing hypoglycemia and hyperglycemia episodes and symptoms, medications doses, her options of short and long term treatment based on the latest standards of care / guidelines;  discussion about incorporating lifestyle medicine;  and documenting the encounter. Risk reduction counseling performed per USPSTF guidelines to reduce  obesity and cardiovascular risk factors.     Please refer to Patient Instructions for Blood Glucose Monitoring and Insulin/Medications Dosing Guide"  in media tab for additional information. Please  also refer to " Patient Self Inventory" in the Media  tab for reviewed elements of pertinent patient history.  Aletha Halim participated in the discussions, expressed understanding, and voiced agreement with the above plans.  All questions were answered to her satisfaction. she is encouraged to contact clinic should she have any questions or concerns prior to her return visit.    Follow up plan: - Return in about 4 months (around 11/21/2022) for F/U with Pre-visit Labs, Meter/CGM/Logs, A1c here.  Glade Lloyd, MD [facility] [address] [place], [place] [address] Phone: [phone]  Fax: [phone]    07/22/2022, 9:00 AM  This note was partially dictated with voice recognition software. Similar sounding words can be transcribed inadequately or may not  be corrected upon review.

## 2022-07-22 NOTE — Patient Instructions (Signed)

## 2022-08-03 ENCOUNTER — Ambulatory Visit: Payer: BC Managed Care – PPO | Admitting: Nutrition

## 2022-08-22 ENCOUNTER — Encounter (INDEPENDENT_AMBULATORY_CARE_PROVIDER_SITE_OTHER): Payer: BC Managed Care – PPO | Admitting: Family

## 2022-08-22 DIAGNOSIS — B3731 Acute candidiasis of vulva and vagina: Secondary | ICD-10-CM | POA: Diagnosis not present

## 2022-08-23 MED ORDER — FLUCONAZOLE 150 MG PO TABS: ORAL_TABLET | ORAL | 0 refills | Status: DC

## 2022-08-23 NOTE — Telephone Encounter (Signed)
Please see the MyChart message reply(ies) for my assessment and plan.  The patient gave consent for this Medical Advice Message and is aware that it may result in a bill to their insurance company as well as the possibility that this may result in a co-payment or deductible. They are an established patient, but are not seeking medical advice exclusively about a problem treated during an in person or video visit in the last 7 days. I did not recommend an in person or video visit within 7 days of my reply.  I spent a total of 11 minutes cumulative time within 7 days through MyChart messaging Dontay Harm, NP 

## 2022-09-02 DIAGNOSIS — J029 Acute pharyngitis, unspecified: Secondary | ICD-10-CM | POA: Diagnosis not present

## 2022-09-02 DIAGNOSIS — E669 Obesity, unspecified: Secondary | ICD-10-CM | POA: Diagnosis not present

## 2022-09-02 DIAGNOSIS — H6503 Acute serous otitis media, bilateral: Secondary | ICD-10-CM | POA: Diagnosis not present

## 2022-09-02 DIAGNOSIS — Z6832 Body mass index (BMI) 32.0-32.9, adult: Secondary | ICD-10-CM | POA: Diagnosis not present

## 2022-09-05 ENCOUNTER — Encounter: Payer: Self-pay | Admitting: Family

## 2022-09-06 NOTE — Progress Notes (Signed)
Patient ID: Shelby Rubio, female    DOB: Nov 24, 2000, 22 y.o.   MRN: [id number]  Chief Complaint  Patient presents with   Ear Pain    sx for a week    HPI:      Ear pain:  Pt was seen at [facility] in [place] on 09/02/22, Diagnosed with serous otitis media bilaterally, given low dose Pred, Flonase, and probiotic (pharmacy did not have this).  Pt states she completed the pred, and still using the Flonase, but no change in sx. Denies any other sinus sx today. Reports getting white flaky/wax out of both ears, sometimes has been greenish.    Assessment & Plan:  1. Acute mucoid otitis media of both ears - seen 5d ago, given Pred & Flonase, pt c/o both ears still painful, left TM dark, no bulging or erythema, right ear with white discharge/cerumen.  - doxycycline (VIBRA-TABS) 100 MG tablet; Take 1 tablet (100 mg total) by mouth 2 (two) times daily for 5 days.  Dispense: 10 tablet; Refill: 0  2. Impacted cerumen of right ear - Verbal consent received to perform right ear lavage via Hydrogen peroxide/water mix solution. Curette used to remove visible cerumen. Pt tolerated well, complete evacuation of almost all cerumen obtained. Mild erythema but no bleeding noted in ear canal after procedure.  3. Acute diffuse otitis externa of both ears - fungal yeast discharge; sending Cortisporin ear drops, advised on use & SE, advised to gently use Qtip only on debris on outer edge of ear to remove.   - neomycin-polymyxin-hydrocortisone (CORTISPORIN) OTIC solution; Place 4 drops into both ears 4 (four) times daily.  Dispense: 10 mL; Refill: 0   Subjective:    Outpatient Medications Prior to Visit  Medication Sig Dispense Refill   cetirizine (ZYRTEC) 10 MG tablet Take 1 tablet (10 mg total) by mouth daily. 30 tablet 3   fluticasone (FLONASE) 50 MCG/ACT nasal spray Place 2 sprays into both nostrils daily. 16 g 3   glipiZIDE (GLUCOTROL XL) 2.5 MG 24 hr tablet Take 1 tablet (2.5 mg total) by mouth  daily with breakfast. 90 tablet 1   ibuprofen (ADVIL) 800 MG tablet Take 800 mg by mouth 3 (three) times daily.     levonorgestrel (MIRENA) 20 MCG/DAY IUD 1 each by Intrauterine route once.     lisinopril (ZESTRIL) 5 MG tablet Take 1 tablet (5 mg total) by mouth daily. 90 tablet 3   metFORMIN (GLUCOPHAGE-XR) 750 MG 24 hr tablet Take 1 tablet (750 mg total) by mouth 2 (two) times daily after a meal. 180 tablet 3   predniSONE (DELTASONE) 10 MG tablet Take 10 mg by mouth daily.     Continuous Blood Gluc Sensor (FREESTYLE LIBRE 3 SENSOR) MISC 1 Piece by Does not apply route every 14 (fourteen) days. Place 1 sensor on the skin every 14 days. Use to check glucose continuously 2 each 2   fluconazole (DIFLUCAN) 150 MG tablet Take 1 pill today and the 2nd pill in 3 days. 2 tablet 0   nicotine (NICODERM CQ - DOSED IN MG/24 HOURS) 21 mg/24hr patch Place 1 patch (21 mg total) onto the skin daily. 28 patch 0   No facility-administered medications prior to visit.   Past Medical History:  Diagnosis Date   Abnormal menses 06/06/2018   Anxiety 2011?   When diagnosed depressed   Asthma    When i was little   Behavior disorder    Diabetes mellitus without complication ([facility]) 09/2021  Type 2   Dizziness 03/01/2019   History of hypoglycemia 03/01/2019   Lower abdominal pain 06/06/2018   Refused influenza vaccine 06/06/2018   Past Surgical History:  Procedure Laterality Date   ADENOIDECTOMY     TONSILLECTOMY     tubes in ears     Allergies  Allergen Reactions   Penicillins Hives   Amoxil [Amoxicillin] Hives      Objective:    Physical Exam Vitals and nursing note reviewed.  Constitutional:      Appearance: Normal appearance.  HENT:     Right Ear: A middle ear effusion (ear canal w/erythema after irrigation & wax removal) is present. There is impacted cerumen (white discharge/cerumen).     Left Ear: Drainage (white, discharge noted in ear, not impacted) and tenderness present. A middle ear  effusion is present. Tympanic membrane is not erythematous or bulging.  Cardiovascular:     Rate and Rhythm: Normal rate and regular rhythm.  Pulmonary:     Effort: Pulmonary effort is normal.     Breath sounds: Normal breath sounds.  Musculoskeletal:        General: Normal range of motion.  Skin:    General: Skin is warm and dry.  Neurological:     Mental Status: She is alert.  Psychiatric:        Mood and Affect: Mood normal.        Behavior: Behavior normal.   BP 138/83 (BP Location: Left Arm, Patient Position: Sitting, Cuff Size: Large)   Pulse 81   Temp 97.8 F (36.6 C) (Temporal)   Ht 5\' 4"  (1.626 m)   Wt 186 lb 6 oz (84.5 kg)   SpO2 94%   BMI 31.99 kg/m  Wt Readings from Last 3 Encounters:  09/07/22 186 lb 6 oz (84.5 kg)  07/22/22 194 lb 6.4 oz (88.2 kg)  05/03/22 181 lb 2 oz (82.2 kg)      Dulce Sellar, NP

## 2022-09-07 ENCOUNTER — Encounter: Payer: Self-pay | Admitting: Family

## 2022-09-07 ENCOUNTER — Ambulatory Visit (INDEPENDENT_AMBULATORY_CARE_PROVIDER_SITE_OTHER): Payer: BC Managed Care – PPO | Admitting: Family

## 2022-09-07 VITALS — BP 138/83 | HR 81 | Temp 97.8°F | Ht 64.0 in | Wt 186.4 lb

## 2022-09-07 DIAGNOSIS — H65113 Acute and subacute allergic otitis media (mucoid) (sanguinous) (serous), bilateral: Secondary | ICD-10-CM | POA: Diagnosis not present

## 2022-09-07 DIAGNOSIS — H60313 Diffuse otitis externa, bilateral: Secondary | ICD-10-CM | POA: Diagnosis not present

## 2022-09-07 DIAGNOSIS — H65193 Other acute nonsuppurative otitis media, bilateral: Secondary | ICD-10-CM

## 2022-09-07 DIAGNOSIS — H6121 Impacted cerumen, right ear: Secondary | ICD-10-CM | POA: Diagnosis not present

## 2022-09-07 MED ORDER — NEOMYCIN-POLYMYXIN-HC 3.5-10000-1 OT SOLN: 4.0000 [drp] | Freq: Four times a day (QID) | OTIC | 0 refills | Status: DC

## 2022-09-07 MED ORDER — DOXYCYCLINE HYCLATE 100 MG PO TABS: 100.0000 mg | ORAL_TABLET | Freq: Two times a day (BID) | ORAL | 0 refills | Status: AC

## 2022-10-05 ENCOUNTER — Ambulatory Visit: Payer: BC Managed Care – PPO | Admitting: Family

## 2022-10-12 ENCOUNTER — Ambulatory Visit (INDEPENDENT_AMBULATORY_CARE_PROVIDER_SITE_OTHER): Payer: BC Managed Care – PPO | Admitting: Family

## 2022-10-12 ENCOUNTER — Encounter: Payer: Self-pay | Admitting: Family

## 2022-10-12 VITALS — BP 134/92 | HR 92 | Temp 97.4°F | Ht 64.0 in | Wt 181.4 lb

## 2022-10-12 DIAGNOSIS — E669 Obesity, unspecified: Secondary | ICD-10-CM

## 2022-10-12 DIAGNOSIS — E1169 Type 2 diabetes mellitus with other specified complication: Secondary | ICD-10-CM

## 2022-10-12 DIAGNOSIS — F331 Major depressive disorder, recurrent, moderate: Secondary | ICD-10-CM | POA: Diagnosis not present

## 2022-10-12 MED ORDER — FLUOXETINE HCL 10 MG PO TABS: 10.0000 mg | ORAL_TABLET | Freq: Every day | ORAL | 2 refills | Status: DC

## 2022-10-12 NOTE — Assessment & Plan Note (Addendum)
chronic hx of taking Prozac years ago and did well reports having more sx now and binge eating pt has had therapy in past, does not want to restart restarting Prozac 10mg  qd f/u in 1 mo - ok if virtual

## 2022-10-12 NOTE — Assessment & Plan Note (Signed)
chronic under ENDO care, states she can't thru to the office or she leaves a message and does not hear back for over a week CBGs running in upper 200s, reports binge eating after work in the evenings advised to increase Glipizide to twice a day and let ENDO know, continue Metformin & monitoring CBGs

## 2022-10-12 NOTE — Progress Notes (Signed)
Patient ID: Shelby Rubio, female    DOB: 01-Feb-2001, 22 y.o.   MRN: [id number]  Chief Complaint  Patient presents with   Diabetes    Blood sugars have been between 240-320, Present for 3 weeks and blood sugars will not go below 240.    Depression    Pt c/o depression and binge eating. Would like to discuss medication.     HPI: Depression:  pt reports having new sx - does not want to get out of bed, states not sure why she is feeling this way, but also started binge eating & not sure if d/t diabetes or depression. Denies any changes in relationships or work to cause sx.  T2DM: Pt is currently maintained on the following medications for diabetes: Metformin Reports having polyuria/polydipsia/polyphagia w/binge eating after work in evenings. Denies hypoglycemia Home glucose readings range: having high CBG #'s - up to 300 Last seen by ENDO in [place] in Dec - was on insulin but causing low readings so ER told her to d/c and monitor her CBGs before meals & qhs - ENDO advised on whole food diet, & continued Metformin and started Glipizide 2.5mg  qam.  Assessment & Plan:  Moderate episode of recurrent major depressive disorder ([place]) Assessment & Plan: chronic hx of taking Prozac years ago and did well reports having more sx now and binge eating pt has had therapy in past, does not want to restart restarting Prozac 10mg  qd f/u in 1 mo - ok if virtual  Orders: -     FLUoxetine HCl; Take 1 tablet (10 mg total) by mouth daily.  Dispense: 30 tablet; Refill: 2  Type 2 diabetes mellitus with obesity ([place]) Assessment & Plan: chronic under ENDO care, states she can't thru to the office or she leaves a message and does not hear back for over a week CBGs running in upper 200s, reports binge eating after work in the evenings advised to increase Glipizide to twice a day and let ENDO know, continue Metformin & monitoring CBGs     Subjective:    Outpatient Medications Prior to Visit   Medication Sig Dispense Refill   cetirizine (ZYRTEC) 10 MG tablet Take 1 tablet (10 mg total) by mouth daily. 30 tablet 3   fluticasone (FLONASE) 50 MCG/ACT nasal spray Place 2 sprays into both nostrils daily. 16 g 3   levonorgestrel (MIRENA) 20 MCG/DAY IUD 1 each by Intrauterine route once.     lisinopril (ZESTRIL) 5 MG tablet Take 1 tablet (5 mg total) by mouth daily. 90 tablet 3   metFORMIN (GLUCOPHAGE-XR) 750 MG 24 hr tablet Take 1 tablet (750 mg total) by mouth 2 (two) times daily after a meal. 180 tablet 3   neomycin-polymyxin-hydrocortisone (CORTISPORIN) OTIC solution Place 4 drops into both ears 4 (four) times daily. 10 mL 0   glipiZIDE (GLUCOTROL XL) 2.5 MG 24 hr tablet Take 1 tablet (2.5 mg total) by mouth daily with breakfast. 90 tablet 1   ibuprofen (ADVIL) 800 MG tablet Take 800 mg by mouth 3 (three) times daily.     predniSONE (DELTASONE) 10 MG tablet Take 10 mg by mouth daily.     No facility-administered medications prior to visit.   Past Medical History:  Diagnosis Date   Abnormal menses 06/06/2018   Anxiety 2011?   When diagnosed depressed   Asthma    When i was little   Behavior disorder    Diabetes mellitus without complication ([place]) [id number]   Type 2  Dizziness 03/01/2019   History of hypoglycemia 03/01/2019   Lower abdominal pain 06/06/2018   Refused influenza vaccine 06/06/2018   Past Surgical History:  Procedure Laterality Date   ADENOIDECTOMY     TONSILLECTOMY     tubes in ears     Allergies  Allergen Reactions   Penicillins Hives   Amoxil [Amoxicillin] Hives      Objective:    Physical Exam Vitals and nursing note reviewed.  Constitutional:      Appearance: Normal appearance.  Cardiovascular:     Rate and Rhythm: Normal rate and regular rhythm.  Pulmonary:     Effort: Pulmonary effort is normal.     Breath sounds: Normal breath sounds.  Musculoskeletal:        General: Normal range of motion.  Skin:    General: Skin is warm and dry.   Neurological:     Mental Status: She is alert.  Psychiatric:        Mood and Affect: Mood normal.        Behavior: Behavior normal.    BP (!) 134/92 (BP Location: Left Arm, Patient Position: Sitting, Cuff Size: Large)   Pulse 92   Temp (!) 97.4 F (36.3 C) (Temporal)   Ht 5\' 4"  (1.626 m)   Wt 181 lb 6.4 oz (82.3 kg)   LMP 10/09/2022 (Exact Date)   SpO2 98%   BMI 31.14 kg/m  Wt Readings from Last 3 Encounters:  10/12/22 181 lb 6.4 oz (82.3 kg)  09/07/22 186 lb 6 oz (84.5 kg)  07/22/22 194 lb 6.4 oz (88.2 kg)       Jeanie Sewer, NP

## 2022-10-13 ENCOUNTER — Telehealth: Payer: Self-pay | Admitting: "Endocrinology

## 2022-10-13 NOTE — Telephone Encounter (Signed)
Pt called with high BG readings.   Date Before breakfast Before lunch Before supper Bedtime  3/11 250   260  3/12 268   288  3/13 217   236  3/14  After Lunch 284      Pt taking:  Metformin XL 750 bid  Glipizide XL 2.5 bid ( PCP changed to bid on 3/13)

## 2022-10-14 ENCOUNTER — Other Ambulatory Visit: Payer: Self-pay

## 2022-10-14 DIAGNOSIS — E1165 Type 2 diabetes mellitus with hyperglycemia: Secondary | ICD-10-CM

## 2022-10-14 MED ORDER — GLIPIZIDE ER 5 MG PO TB24: 10.0000 mg | ORAL_TABLET | Freq: Every day | ORAL | 0 refills | Status: DC

## 2022-10-14 NOTE — Telephone Encounter (Signed)
Tried to reach pt/no answer

## 2022-10-14 NOTE — Telephone Encounter (Signed)
Spoke with pt, advised her to continue checking her BG 2 times daily, before breakfast and before bedtime. Also to increase her glipizide to 5mg  2 tablets daily until her next visit which has been moved up to 11/03/22 at 9:30. Pt voiced understanding.

## 2022-10-26 ENCOUNTER — Other Ambulatory Visit: Payer: Self-pay | Admitting: Family

## 2022-10-26 DIAGNOSIS — Z794 Long term (current) use of insulin: Secondary | ICD-10-CM | POA: Diagnosis not present

## 2022-10-26 DIAGNOSIS — F331 Major depressive disorder, recurrent, moderate: Secondary | ICD-10-CM

## 2022-10-26 DIAGNOSIS — E1165 Type 2 diabetes mellitus with hyperglycemia: Secondary | ICD-10-CM | POA: Diagnosis not present

## 2022-10-26 MED ORDER — FLUOXETINE HCL 10 MG PO CAPS: 10.0000 mg | ORAL_CAPSULE | Freq: Every day | ORAL | 1 refills | Status: DC

## 2022-10-26 MED ORDER — FLUOXETINE HCL 10 MG PO CAPS: 10.0000 mg | ORAL_CAPSULE | Freq: Every day | ORAL | 3 refills | Status: DC

## 2022-10-27 LAB — T4, FREE: Free T4: 1.6 ng/dL (ref 0.82–1.77)

## 2022-10-27 LAB — LIPID PANEL
Chol/HDL Ratio: 4.4 ratio (ref 0.0–4.4)
Cholesterol, Total: 146 mg/dL (ref 100–199)
HDL: 33 mg/dL — ABNORMAL LOW (ref >39)
LDL Chol Calc (NIH): 89 mg/dL (ref 0–99)
Triglycerides: 137 mg/dL (ref 0–149)
VLDL Cholesterol Cal: 24 mg/dL (ref 5–40)

## 2022-10-27 LAB — TSH: TSH: 2.6 u[IU]/mL (ref 0.450–4.500)

## 2022-10-27 LAB — COMPREHENSIVE METABOLIC PANEL
ALT: 11 IU/L (ref 0–32)
AST: 12 IU/L (ref 0–40)
Albumin/Globulin Ratio: 2.1 (ref 1.2–2.2)
Albumin: 4.9 g/dL (ref 4.0–5.0)
Alkaline Phosphatase: 88 IU/L (ref 44–121)
BUN/Creatinine Ratio: 23 (ref 9–23)
BUN: 14 mg/dL (ref 6–20)
Bilirubin Total: 0.5 mg/dL (ref 0.0–1.2)
CO2: 21 mmol/L (ref 20–29)
Calcium: 9.5 mg/dL (ref 8.7–10.2)
Chloride: 99 mmol/L (ref 96–106)
Creatinine, Ser: 0.61 mg/dL (ref 0.57–1.00)
Globulin, Total: 2.3 g/dL (ref 1.5–4.5)
Glucose: 203 mg/dL — ABNORMAL HIGH (ref 70–99)
Potassium: 4.3 mmol/L (ref 3.5–5.2)
Sodium: 139 mmol/L (ref 134–144)
Total Protein: 7.2 g/dL (ref 6.0–8.5)
eGFR: 129 mL/min/{1.73_m2} (ref >59)

## 2022-10-31 ENCOUNTER — Telehealth: Payer: Self-pay

## 2022-10-31 NOTE — Telephone Encounter (Signed)
-----   Message from Jeanie Sewer, NP sent at 10/26/2022  6:16 PM EDT ----- Regarding: Prozac Call Shelby Rubio please and let her know her insurance didn't cover the Prozac because they prefer the capsules, vs the tablets. I have resent the RX and she should be able to pick this up and get started! She needs to call us if there is still a problem at the pharmacy!

## 2022-10-31 NOTE — Telephone Encounter (Signed)
I called pt in regards to RX, pt gave a verbalized understanding. No questions or concerns at this time.

## 2022-11-03 ENCOUNTER — Ambulatory Visit (INDEPENDENT_AMBULATORY_CARE_PROVIDER_SITE_OTHER): Payer: BC Managed Care – PPO | Admitting: "Endocrinology

## 2022-11-03 ENCOUNTER — Encounter: Payer: Self-pay | Admitting: "Endocrinology

## 2022-11-03 VITALS — BP 106/62 | HR 82 | Ht 64.0 in | Wt 182.8 lb

## 2022-11-03 DIAGNOSIS — F17200 Nicotine dependence, unspecified, uncomplicated: Secondary | ICD-10-CM

## 2022-11-03 DIAGNOSIS — Z6831 Body mass index (BMI) 31.0-31.9, adult: Secondary | ICD-10-CM

## 2022-11-03 DIAGNOSIS — E6609 Other obesity due to excess calories: Secondary | ICD-10-CM

## 2022-11-03 DIAGNOSIS — E1165 Type 2 diabetes mellitus with hyperglycemia: Secondary | ICD-10-CM

## 2022-11-03 DIAGNOSIS — Z794 Long term (current) use of insulin: Secondary | ICD-10-CM

## 2022-11-03 LAB — POCT GLYCOSYLATED HEMOGLOBIN (HGB A1C): HbA1c, POC (controlled diabetic range): 9.4 % — AB (ref 0.0–7.0)

## 2022-11-03 MED ORDER — GLIPIZIDE ER 5 MG PO TB24
5.0000 mg | ORAL_TABLET | Freq: Every day | ORAL | 1 refills | Status: DC
Start: 2022-11-03 — End: 2023-09-19

## 2022-11-03 MED ORDER — TOUJEO MAX SOLOSTAR 300 UNIT/ML ~~LOC~~ SOPN: 20.0000 [IU] | PEN_INJECTOR | Freq: Every evening | SUBCUTANEOUS | 1 refills | Status: DC

## 2022-11-03 MED ORDER — BD PEN NEEDLE NANO U/F 32G X 4 MM MISC
1.0000 | Freq: Four times a day (QID) | 2 refills | Status: DC
Start: 2022-11-03 — End: 2023-09-19

## 2022-11-03 NOTE — Progress Notes (Signed)
11/03/2022, 11:41 AM  Endocrinology follow-up note   Subjective:    Patient ID: Shelby Rubio, female    DOB: 2000/12/09.  Shelby Rubio is being seen in follow-up after she was seen in consultation for management of currently uncontrolled symptomatic diabetes requested by  Dulce Sellar, NP.   Past Medical History:  Diagnosis Date   Abnormal menses 06/06/2018   Anxiety 2011?   When diagnosed depressed   Asthma    When i was little   Behavior disorder    Diabetes mellitus without complication 09/2021   Type 2   Dizziness 03/01/2019   History of hypoglycemia 03/01/2019   Lower abdominal pain 06/06/2018   Refused influenza vaccine 06/06/2018    Past Surgical History:  Procedure Laterality Date   ADENOIDECTOMY     TONSILLECTOMY     tubes in ears      Social History   Socioeconomic History   Marital status: Single    Spouse name: Not on file   Number of children: Not on file   Years of education: Not on file   Highest education level: Not on file  Occupational History   Not on file  Tobacco Use   Smoking status: Every Day    Packs/day: 1.00    Years: 4.00    Additional pack years: 0.00    Total pack years: 4.00    Types: Cigarettes   Smokeless tobacco: Never  Vaping Use   Vaping Use: Some days  Substance and Sexual Activity   Alcohol use: No   Drug use: Never   Sexual activity: Yes    Birth control/protection: Abstinence, Condom, I.U.D.  Other Topics Concern   Not on file  Social History Narrative   Not on file   Social Determinants of Health   Financial Resource Strain: Not on file  Food Insecurity: Not on file  Transportation Needs: Not on file  Physical Activity: Not on file  Stress: Not on file  Social Connections: Not on file    Family History  Problem Relation Age of Onset   Diabetes Mother    Hypertension Mother    Obesity Mother    Asthma Sister    Asthma Brother    Diabetes Other    ADD / ADHD Father     Alcohol abuse Father    Anxiety disorder Father    Arthritis Father    Depression Father    Stroke Father    Arthritis Maternal Grandfather    Asthma Maternal Grandfather    Cancer Maternal Grandfather    Diabetes Maternal Grandfather    Hearing loss Maternal Grandfather    Kidney disease Maternal Grandfather    Arthritis Maternal Grandmother    Miscarriages / Stillbirths Maternal Grandmother    ADD / ADHD Paternal Grandfather    Anxiety disorder Paternal Grandfather    Arthritis Paternal Grandfather    Depression Paternal Grandfather    Diabetes Paternal Grandfather    Early death Paternal Grandfather    Heart disease Paternal Grandfather    Anxiety disorder Paternal Grandmother    Depression Paternal Grandmother    Diabetes Paternal Grandmother    Obesity Paternal Grandmother    ADD / ADHD Brother    Anxiety disorder Brother    Asthma Brother    Depression Brother    ADD / ADHD Brother    ADD / ADHD Paternal Uncle    Anxiety disorder Paternal Uncle    Arthritis Paternal Uncle  Asthma Paternal Uncle    Depression Paternal Uncle    Diabetes Paternal Uncle    Hypertension Paternal Uncle    Obesity Paternal Uncle    ADD / ADHD Paternal Uncle    Anxiety disorder Paternal Uncle    Arthritis Paternal Uncle    Asthma Paternal Uncle    Depression Paternal Uncle    Diabetes Paternal Uncle    Hypertension Paternal Uncle    Obesity Paternal Uncle    Anxiety disorder Sister    Asthma Sister    Depression Sister    Miscarriages / Stillbirths Sister    Anxiety disorder Paternal Uncle    Depression Paternal Uncle    Diabetes Paternal Uncle    Learning disabilities Paternal Uncle    Obesity Paternal Uncle    Diabetes Paternal Uncle    Kidney disease Paternal Uncle    Obesity Paternal Uncle    Miscarriages / Stillbirths Maternal Aunt    Obesity Maternal Aunt     Outpatient Encounter Medications as of 11/03/2022  Medication Sig   insulin glargine, 2 Unit Dial, (TOUJEO  MAX SOLOSTAR) 300 UNIT/ML Solostar Pen Inject 20 Units into the skin at bedtime.   Insulin Pen Needle (BD PEN NEEDLE NANO U/F) 32G X 4 MM MISC 1 each by Does not apply route 4 (four) times daily.   cetirizine (ZYRTEC) 10 MG tablet Take 1 tablet (10 mg total) by mouth daily.   FLUoxetine (PROZAC) 10 MG capsule Take 1 capsule (10 mg total) by mouth daily.   fluticasone (FLONASE) 50 MCG/ACT nasal spray Place 2 sprays into both nostrils daily.   glipiZIDE (GLUCOTROL XL) 5 MG 24 hr tablet Take 1 tablet (5 mg total) by mouth daily with breakfast.   levonorgestrel (MIRENA) 20 MCG/DAY IUD 1 each by Intrauterine route once.   lisinopril (ZESTRIL) 5 MG tablet Take 1 tablet (5 mg total) by mouth daily.   metFORMIN (GLUCOPHAGE-XR) 750 MG 24 hr tablet Take 1 tablet (750 mg total) by mouth 2 (two) times daily after a meal.   neomycin-polymyxin-hydrocortisone (CORTISPORIN) OTIC solution Place 4 drops into both ears 4 (four) times daily.   [DISCONTINUED] glipiZIDE (GLUCOTROL XL) 5 MG 24 hr tablet Take 2 tablets (10 mg total) by mouth daily with breakfast.   No facility-administered encounter medications on file as of 11/03/2022.    ALLERGIES: Allergies  Allergen Reactions   Penicillins Hives   Amoxil [Amoxicillin] Hives    VACCINATION STATUS: Immunization History  Administered Date(s) Administered   DTaP 05/12/1999, 07/14/1999, 09/09/1999, 10/13/2000, 03/20/2003   HIB (PRP-OMP) 05/12/1999, 07/14/1999, 09/09/1999, 08/22/2000   Hepatitis A 03/09/2011   Hepatitis A, Ped/Adol-2 Dose 04/28/2014   Hepatitis B 04/01/1999, 05/12/1999, 08/23/1999   IPV 05/12/1999, 07/14/1999, 10/13/2000, 03/20/2003   Influenza,inj,Quad PF,6+ Mos 06/08/2016   MMR 08/22/2000, 03/20/2003   Meningococcal Conjugate 03/09/2011, 06/08/2016   Pneumococcal Conjugate-13 08/23/1999, 09/09/1999, 03/14/2000, 10/13/2000   Tdap 02/10/2010   Varicella 03/20/2003, 04/28/2014    Diabetes She presents for her follow-up diabetic visit. She  has type 2 diabetes mellitus. Onset time: She was diagnosed at approximate age of 22, when she was admitted for hyperglycemic, hyperosmolar coma. Her disease course has been worsening. There are no hypoglycemic associated symptoms. Pertinent negatives for hypoglycemia include no confusion, headaches, pallor or seizures. Associated symptoms include fatigue. Pertinent negatives for diabetes include no chest pain, no polydipsia, no polyphagia and no polyuria. There are no hypoglycemic complications. Symptoms are worsening. (Recent hyperosmolar coma.  She has difficulty affording medications.) Risk factors for  coronary artery disease include diabetes mellitus, obesity, family history, tobacco exposure and sedentary lifestyle. Current diabetic treatments: She is currently on glimepiride 4 mg p.o. twice daily, Novolin and 15 units twice a day, metformin 750 mg p.o. twice daily. Her weight is fluctuating minimally. She is following a generally unhealthy diet. When asked about meal planning, she reported none. She has not had a previous visit with a dietitian. She rarely participates in exercise. Her home blood glucose trend is increasing steadily. Her overall blood glucose range is >200 mg/dl. (She presents with her meter showing average blood glucose of 249 for the last 30 days, measuring only 1 time a day.  Her point-of-care A1c was 9.4% increasing from 7.8% during her last visit.  This is an overall improvement from A1c of 13.2%.  She did not have any recent hypoglycemic episodes.    ) An ACE inhibitor/angiotensin II receptor blocker is not being taken. She does not see a podiatrist.Eye exam is not current.    Review of Systems  Constitutional:  Positive for fatigue. Negative for chills, fever and unexpected weight change.  HENT:  Negative for trouble swallowing and voice change.   Eyes:  Negative for visual disturbance.  Respiratory:  Negative for cough, shortness of breath and wheezing.   Cardiovascular:   Negative for chest pain, palpitations and leg swelling.  Gastrointestinal:  Negative for diarrhea, nausea and vomiting.  Endocrine: Negative for cold intolerance, heat intolerance, polydipsia, polyphagia and polyuria.  Musculoskeletal:  Negative for arthralgias and myalgias.  Skin:  Negative for color change, pallor, rash and wound.  Neurological:  Negative for seizures and headaches.  Psychiatric/Behavioral:  Negative for confusion and suicidal ideas.     Objective:       11/03/2022    9:01 AM 10/12/2022    2:51 PM 09/07/2022    3:21 PM  Vitals with BMI  Height 5\' 4"  5\' 4"  5\' 4"   Weight 182 lbs 13 oz 181 lbs 6 oz 186 lbs 6 oz  BMI 31.36 31.12 31.98  Systolic 106 134 161  Diastolic 62 92 83  Pulse 82 92 81    BP 106/62   Pulse 82   Ht 5\' 4"  (1.626 m)   Wt 182 lb 12.8 oz (82.9 kg)   LMP 10/09/2022 (Exact Date)   BMI 31.38 kg/m   Wt Readings from Last 3 Encounters:  11/03/22 182 lb 12.8 oz (82.9 kg)  10/12/22 181 lb 6.4 oz (82.3 kg)  09/07/22 186 lb 6 oz (84.5 kg)      CMP ( most recent) CMP     Component Value Date/Time   NA 139 10/26/2022 1121   K 4.3 10/26/2022 1121   CL 99 10/26/2022 1121   CO2 21 10/26/2022 1121   GLUCOSE 203 (H) 10/26/2022 1121   GLUCOSE 230 (H) 03/28/2022 0329   BUN 14 10/26/2022 1121   CREATININE 0.61 10/26/2022 1121   CALCIUM 9.5 10/26/2022 1121   PROT 7.2 10/26/2022 1121   ALBUMIN 4.9 10/26/2022 1121   AST 12 10/26/2022 1121   ALT 11 10/26/2022 1121   ALKPHOS 88 10/26/2022 1121   BILITOT 0.5 10/26/2022 1121   GFRNONAA >60 03/28/2022 0329   GFRAA 147 06/06/2018 1438     Diabetic Labs (most recent): Lab Results  Component Value Date   HGBA1C 9.4 (A) 11/03/2022   HGBA1C 7.8 (A) 07/22/2022   HGBA1C 13.2 (H) 03/27/2022     Lipid Panel ( most recent) Lipid Panel     Component  Value Date/Time   CHOL 146 10/26/2022 1121   TRIG 137 10/26/2022 1121   HDL 33 (L) 10/26/2022 1121   CHOLHDL 4.4 10/26/2022 1121   CHOLHDL 4 09/09/2021  1350   VLDL 23.8 09/09/2021 1350   LDLCALC 89 10/26/2022 1121   LABVLDL 24 10/26/2022 1121      Lab Results  Component Value Date   TSH 2.600 10/26/2022   TSH 1.660 06/06/2018   FREET4 1.60 10/26/2022      Assessment & Plan:   1. Type 2 diabetes mellitus with hyperglycemia, with long-term current use of insulin ([facility]CC)   - Shelby Rubio has currently uncontrolled symptomatic type 2 DM since  22 years of age.  She presents with her meter showing average blood glucose of 249 for the last 30 days, measuring only 1 time a day.  Her point-of-care A1c was 9.4% increasing from 7.8% during her last visit.  This is an overall improvement from A1c of 13.2%.  She did not have any recent hypoglycemic episodes.    -Reviewed her most recent labs. - I had a long discussion with her about the possible risk factors and  the pathology behind its diabetes and its complications. -her diabetes is complicated by obesity, social constraints and she remains at a high risk for more acute and chronic complications which include CAD, CVA, CKD, retinopathy, and neuropathy. These are all discussed in detail with her.  - I discussed all available options of managing her diabetes including de-escalation of medications. I have counseled her on Food as Medicine by adopting a Whole Food , Plant Predominant  ( WFPP) nutrition as recommended by [employer]. Patient is encouraged to switch to  unprocessed or minimally processed  complex starch, adequate protein intake (mainly plant source), minimal liquid fat, plenty of fruits, and vegetables. -  she is advised to stick to a routine mealtimes to eat 3 complete meals a day and snack only when necessary ( to snack only to correct hypoglycemia BG <70 day time or <100 at night).   She has disengaged from lifestyle medicine nutrition. - she acknowledges that there is a room for improvement in her food and drink choices. - Suggestion is made for her to  avoid simple carbohydrates  from her diet including Cakes, Sweet Desserts, Ice Cream, Soda (diet and regular), Sweet Tea, Candies, Chips, Cookies, Store Bought Juices, Alcohol , Artificial Sweeteners,  Coffee Creamer, and "Sugar-free" Products, Lemonade. This will help patient to have more stable blood glucose profile and potentially avoid unintended weight gain.  The following Lifestyle Medicine recommendations according to American College of Lifestyle Medicine  [facility]( ACLM) were discussed and and offered to patient and she  agrees to start the journey:  A. Whole Foods, Plant-Based Nutrition comprising of fruits and vegetables, plant-based proteins, whole-grain carbohydrates was discussed in detail with the patient.   A list for source of those nutrients were also provided to the patient.  Patient will use only water or unsweetened tea for hydration. B.  The need to stay away from risky substances including alcohol, smoking; obtaining 7 to 9 hours of restorative sleep, at least 150 minutes of moderate intensity exercise weekly, the importance of healthy social connections,  and stress management techniques were discussed. C.  A full color page of  Calorie density of various food groups per pound showing examples of each food groups was provided to the patient.   - she has been  scheduled with Norm SaltPenny Crumpton, RDN,  CDE for individualized diabetes education.  - I have approached her with the following individualized plan to manage  her diabetes and patient agrees:   -In light of her presentation with uncontrolled hyperglycemia, she will need insulin treatment.  I discussed initiated Toujeo 20 units nightly, associated with monitoring of blood of blood glucose 4 times a day-before meals and at bedtime.   She will return in 10 days with her meter and logs.  She will benefit from a CGM, will be considered for freestyle libre or Dexcom device.   owever, she will need some additional intervention on top of her  metformin 750 mg p.o. twice daily.  She is advised to continue glipizide  5 mg XL p.o. daily at breakfast.  She is advised and willing to continue monitoring blood glucose twice a day-daily before breakfast and at bedtime.   - she is encouraged to call clinic for blood glucose levels less than 70 or above 200 mg /dl. She may need work-up to properly classify her diabetes on subsequent visits.  - Specific targets for  A1c;  LDL, HDL,  and Triglycerides were discussed with the patient.  2) Blood Pressure /Hypertension:   Her blood pressure is controlled to target.  She is not on antihypertensive medications for now.  He is planning to quit smoking on the new year. The patient was counseled on the dangers of tobacco use, and was advised to quit.  Reviewed strategies to maximize success, including removing cigarettes and smoking materials from environment.   3) Lipids/Hyperlipidemia:   Review of her recent lipid panel showed  controlled  LDL at 79 .  she  is not on statins.   She will have fasting lipid panel before her next visit.  4)  Weight/Diet:  Body mass index is 31.38 kg/m.  -   clearly complicating her diabetes care.   she is  a candidate for weight loss. I discussed with her the fact that loss of 5 - 10% of her  current body weight will have the most impact on her diabetes management.  The above detailed  ACLM recommendations for nutrition, exercise, sleep, social life, avoidance of risky substances, the need for restorative sleep   information will also detailed on discharge instructions.  5) Chronic Care/Health Maintenance:  -she  is not  on ACEI/ARB and Statin medications and  is encouraged to initiate and continue to follow up with Ophthalmology, Dentist,  Podiatrist at least yearly or according to recommendations, and advised to  quit smoking. I have recommended yearly flu vaccine and pneumonia vaccine at least every 5 years; moderate intensity exercise for up to 150 minutes weekly; and   sleep for 7- 9 hours a day.  The patient was counseled on the dangers of tobacco use, and was advised to quit.  Reviewed strategies to maximize success, including removing cigarettes and smoking materials from environment.   - she is  advised to maintain close follow up with Dulce Sellar, NP for primary care needs, as well as her other providers for optimal and coordinated care.   I spent  28  minutes in the care of the patient today including review of labs from CMP, Lipids, Thyroid Function, Hematology (current and previous including abstractions from other facilities); face-to-face time discussing  her blood glucose readings/logs, discussing hypoglycemia and hyperglycemia episodes and symptoms, medications doses, her options of short and long term treatment based on the latest standards of care / guidelines;  discussion about incorporating lifestyle medicine;  and documenting the encounter. Risk reduction counseling performed per USPSTF guidelines to reduce  obesity and cardiovascular risk factors.     Please refer to Patient Instructions for Blood Glucose Monitoring and Insulin/Medications Dosing Guide"  in media tab for additional information. Please  also refer to " Patient Self Inventory" in the Media  tab for reviewed elements of pertinent patient history.  Shelby Rubio participated in the discussions, expressed understanding, and voiced agreement with the above plans.  All questions were answered to her satisfaction. she is encouraged to contact clinic should she have any questions or concerns prior to her return visit.    Follow up plan: - Return in about 10 days (around 11/13/2022) for F/U with Meter/CGM Megan Salon Only - no Labs.  Marquis Lunch, MD [facility] [address] [place], [place] [address] Phone: [phone]  Fax: [phone]    11/03/2022, 11:41 AM  This note was partially dictated with voice recognition software.  Similar sounding words can be transcribed inadequately or may not  be corrected upon review.

## 2022-11-03 NOTE — Patient Instructions (Signed)

## 2022-11-05 DIAGNOSIS — E1165 Type 2 diabetes mellitus with hyperglycemia: Secondary | ICD-10-CM | POA: Insufficient documentation

## 2022-11-05 DIAGNOSIS — E66811 Obesity, class 1: Secondary | ICD-10-CM | POA: Insufficient documentation

## 2022-11-05 DIAGNOSIS — E1065 Type 1 diabetes mellitus with hyperglycemia: Secondary | ICD-10-CM | POA: Insufficient documentation

## 2022-11-05 DIAGNOSIS — E6609 Other obesity due to excess calories: Secondary | ICD-10-CM | POA: Insufficient documentation

## 2022-11-07 ENCOUNTER — Other Ambulatory Visit: Payer: Self-pay

## 2022-11-07 DIAGNOSIS — E1165 Type 2 diabetes mellitus with hyperglycemia: Secondary | ICD-10-CM

## 2022-11-07 MED ORDER — INSULIN GLARGINE 100 UNIT/ML SOLOSTAR PEN
20.0000 [IU] | PEN_INJECTOR | Freq: Every day | SUBCUTANEOUS | 1 refills | Status: DC
Start: 2022-11-07 — End: 2023-03-06

## 2022-11-15 ENCOUNTER — Encounter: Payer: Self-pay | Admitting: Family

## 2022-11-15 ENCOUNTER — Telehealth (INDEPENDENT_AMBULATORY_CARE_PROVIDER_SITE_OTHER): Payer: BC Managed Care – PPO | Admitting: Family

## 2022-11-15 DIAGNOSIS — F331 Major depressive disorder, recurrent, moderate: Secondary | ICD-10-CM | POA: Diagnosis not present

## 2022-11-15 MED ORDER — VENLAFAXINE HCL ER 37.5 MG PO CP24
37.5000 mg | ORAL_CAPSULE | Freq: Every day | ORAL | 2 refills | Status: DC
Start: 2022-11-15 — End: 2023-01-02

## 2022-11-15 NOTE — Assessment & Plan Note (Signed)
chronic started on Prozac  last visit, reports it made her more moody, mother & coworkers also noticed, though she took in past and tolerated sending Effexor 37.5mg  qam, advised on use & SE f/u in 1 mo - ok if virtual

## 2022-11-15 NOTE — Progress Notes (Signed)
MyChart Video Visit    Virtual Visit via Video Note   This format is felt to be most appropriate for this patient at this time. Physical exam was limited by quality of the video and audio technology used for the visit. CMA was able to get the patient set up on a video visit.  Patient location: Home. Patient and provider in visit Provider location: Office  I discussed the limitations of evaluation and management by telemedicine and the availability of in person appointments. The patient expressed understanding and agreed to proceed.  Visit Date: 11/15/2022  Today's healthcare provider: Dulce Sellar, NP     Subjective:   Patient ID: Shelby Rubio, female    DOB: 04-10-2001, 22 y.o.   MRN: [id number]  Chief Complaint  Patient presents with   Medical Management of Chronic Issues    HPI Depression:  pt reports having new sx - does not want to get out of bed, states not sure why she is feeling this way, but also started binge eating & not sure if d/t diabetes or depression. Denies any changes in relationships or work to cause sx. Started on Prozac last visit about 5 weeks ago and it has made her more moody.  Assessment & Plan:  Moderate episode of recurrent major depressive disorder Assessment & Plan: chronic started on Prozac  last visit, reports it made her more moody, mother & coworkers also noticed, though she took in past and tolerated sending Effexor 37.5mg  qam, advised on use & SE f/u in 1 mo - ok if virtual  Orders: -     Venlafaxine HCl ER; Take 1 capsule (37.5 mg total) by mouth daily with breakfast.  Dispense: 30 capsule; Refill: 2   Past Medical History:  Diagnosis Date   Abnormal menses 06/06/2018   Anxiety 2011?   When diagnosed depressed   Asthma    When i was little   Behavior disorder    Diabetes mellitus without complication 09/2021   Type 2   Dizziness 03/01/2019   History of hypoglycemia 03/01/2019   Lower abdominal pain 06/06/2018    Refused influenza vaccine 06/06/2018    Past Surgical History:  Procedure Laterality Date   ADENOIDECTOMY     TONSILLECTOMY     tubes in ears      Outpatient Medications Prior to Visit  Medication Sig Dispense Refill   cetirizine (ZYRTEC) 10 MG tablet Take 1 tablet (10 mg total) by mouth daily. 30 tablet 3   fluticasone (FLONASE) 50 MCG/ACT nasal spray Place 2 sprays into both nostrils daily. 16 g 3   glipiZIDE (GLUCOTROL XL) 5 MG 24 hr tablet Take 1 tablet (5 mg total) by mouth daily with breakfast. 90 tablet 1   insulin glargine (LANTUS) 100 UNIT/ML Solostar Pen Inject 20 Units into the skin at bedtime. 15 mL 1   Insulin Pen Needle (BD PEN NEEDLE NANO U/F) 32G X 4 MM MISC 1 each by Does not apply route 4 (four) times daily. 100 each 2   levonorgestrel (MIRENA) 20 MCG/DAY IUD 1 each by Intrauterine route once.     lisinopril (ZESTRIL) 5 MG tablet Take 1 tablet (5 mg total) by mouth daily. 90 tablet 3   metFORMIN (GLUCOPHAGE-XR) 750 MG 24 hr tablet Take 1 tablet (750 mg total) by mouth 2 (two) times daily after a meal. 180 tablet 3   SEMGLEE, YFGN, 100 UNIT/ML Pen Inject into the skin.     FLUoxetine (PROZAC) 10 MG capsule Take  1 capsule (10 mg total) by mouth daily. 30 capsule 1   neomycin-polymyxin-hydrocortisone (CORTISPORIN) OTIC solution Place 4 drops into both ears 4 (four) times daily. 10 mL 0   No facility-administered medications prior to visit.    Allergies  Allergen Reactions   Penicillins Hives   Amoxil [Amoxicillin] Hives       Objective:   Physical Exam Vitals and nursing note reviewed.  Constitutional:      General: Pt is not in acute distress.    Appearance: Normal appearance.  HENT:     Head: Normocephalic.  Pulmonary:     Effort: No respiratory distress.  Musculoskeletal:     Cervical back: Normal range of motion.  Skin:    General: Skin is dry.     Coloration: Skin is not pale.  Neurological:     Mental Status: Pt is alert and oriented to person,  place, and time.  Psychiatric:        Mood and Affect: Mood normal.   Ht  (1.626 m)   Wt 182 lb 6 oz (82.7 kg)   LMP 10/31/2022 (Exact Date)   BMI 31.30 kg/m   Wt Readings from Last 3 Encounters:  11/15/22 182 lb 6 oz (82.7 kg)  11/03/22 182 lb 12.8 oz (82.9 kg)  10/12/22 181 lb 6.4 oz (82.3 kg)        I discussed the assessment and treatment plan with the patient. The patient was provided an opportunity to ask questions and all were answered. The patient agreed with the plan and demonstrated an understanding of the instructions.   The patient was advised to call back or seek an in-person evaluation if the symptoms worsen or if the condition fails to improve as anticipated.  Dulce Sellar, NP [facility] [place] [phone] (phone) [phone] (fax)  [facility]

## 2022-11-17 ENCOUNTER — Ambulatory Visit: Payer: BC Managed Care – PPO | Admitting: "Endocrinology

## 2022-11-22 ENCOUNTER — Ambulatory Visit: Payer: BC Managed Care – PPO | Admitting: "Endocrinology

## 2023-01-01 NOTE — Progress Notes (Unsigned)
MyChart Video Visit    Virtual Visit via Video Note   This format is felt to be most appropriate for this patient at this time. Physical exam was limited by quality of the video and audio technology used for the visit. CMA was able to get the patient set up on a video visit.  Patient location: Home. Patient and provider in visit Provider location: Office  I discussed the limitations of evaluation and management by telemedicine and the availability of in person appointments. The patient expressed understanding and agreed to proceed.  Visit Date: 01/02/2023  Today's healthcare provider: Dulce Sellar, NP     Subjective:   Patient ID: Shelby Rubio, female    DOB: 2000/09/08, 22 y.o.   MRN: [id number]  No chief complaint on file.  HPI Depression:  pt reports having new sx - does not want to get out of bed, states not sure why she is feeling this way, but also started binge eating & not sure if d/t diabetes or depression. Denies any changes in relationships or work to cause sx. Started on Prozac last visit about 5 weeks ago and it has made her more moody.  Assessment & Plan:  There are no diagnoses linked to this encounter.  Past Medical History:  Diagnosis Date   Abnormal menses 06/06/2018   Anxiety 2011?   When diagnosed depressed   Asthma    When i was little   Behavior disorder    Diabetes mellitus without complication (HCC) 09/2021   Type 2   Dizziness 03/01/2019   History of hypoglycemia 03/01/2019   Lower abdominal pain 06/06/2018   Refused influenza vaccine 06/06/2018    Past Surgical History:  Procedure Laterality Date   ADENOIDECTOMY     TONSILLECTOMY     tubes in ears      Outpatient Medications Prior to Visit  Medication Sig Dispense Refill   cetirizine (ZYRTEC) 10 MG tablet Take 1 tablet (10 mg total) by mouth daily. 30 tablet 3   fluticasone (FLONASE) 50 MCG/ACT nasal spray Place 2 sprays into both nostrils daily. 16 g 3   glipiZIDE (GLUCOTROL  XL) 5 MG 24 hr tablet Take 1 tablet (5 mg total) by mouth daily with breakfast. 90 tablet 1   insulin glargine (LANTUS) 100 UNIT/ML Solostar Pen Inject 20 Units into the skin at bedtime. 15 mL 1   Insulin Pen Needle (BD PEN NEEDLE NANO U/F) 32G X 4 MM MISC 1 each by Does not apply route 4 (four) times daily. 100 each 2   levonorgestrel (MIRENA) 20 MCG/DAY IUD 1 each by Intrauterine route once.     lisinopril (ZESTRIL) 5 MG tablet Take 1 tablet (5 mg total) by mouth daily. 90 tablet 3   metFORMIN (GLUCOPHAGE-XR) 750 MG 24 hr tablet Take 1 tablet (750 mg total) by mouth 2 (two) times daily after a meal. 180 tablet 3   SEMGLEE, YFGN, 100 UNIT/ML Pen Inject into the skin.     venlafaxine XR (EFFEXOR XR) 37.5 MG 24 hr capsule Take 1 capsule (37.5 mg total) by mouth daily with breakfast. 30 capsule 2   No facility-administered medications prior to visit.    Allergies  Allergen Reactions   Penicillins Hives   Amoxil [Amoxicillin] Hives       Objective:   Physical Exam Vitals and nursing note reviewed.  Constitutional:      General: Pt is not in acute distress.    Appearance: Normal appearance.  HENT:  Head: Normocephalic.  Pulmonary:     Effort: No respiratory distress.  Musculoskeletal:     Cervical back: Normal range of motion.  Skin:    General: Skin is dry.     Coloration: Skin is not pale.  Neurological:     Mental Status: Pt is alert and oriented to person, place, and time.  Psychiatric:        Mood and Affect: Mood normal.   There were no vitals taken for this visit.  Wt Readings from Last 3 Encounters:  11/15/22 182 lb 6 oz (82.7 kg)  11/03/22 182 lb 12.8 oz (82.9 kg)  10/12/22 181 lb 6.4 oz (82.3 kg)        I discussed the assessment and treatment plan with the patient. The patient was provided an opportunity to ask questions and all were answered. The patient agreed with the plan and demonstrated an understanding of the instructions.   The patient was advised  to call back or seek an in-person evaluation if the symptoms worsen or if the condition fails to improve as anticipated.  Dulce Sellar, NP [facility] [place] [phone] (phone) [phone] (fax)  [facility]

## 2023-01-02 ENCOUNTER — Telehealth (INDEPENDENT_AMBULATORY_CARE_PROVIDER_SITE_OTHER): Payer: BC Managed Care – PPO | Admitting: Family

## 2023-01-02 VITALS — Ht 64.0 in | Wt 185.2 lb

## 2023-01-02 DIAGNOSIS — F331 Major depressive disorder, recurrent, moderate: Secondary | ICD-10-CM

## 2023-01-02 MED ORDER — VENLAFAXINE HCL ER 37.5 MG PO CP24
37.5000 mg | ORAL_CAPSULE | Freq: Every day | ORAL | 1 refills | Status: DC
Start: 2023-01-02 — End: 2023-09-19

## 2023-01-02 NOTE — Assessment & Plan Note (Signed)
chronic didn't tolerate Prozac switched to Effexor 37.5mg , states this has helped her depression, no SE, reports anxiety is still present pt ran out of pills 2 weeks ago, could not get appt for f/u until now advised to restart daily for 2 weeks, if sx are still not at goal, ok to increase to 2 capsules qd if needed f/u in 1-2 mo - ok if virtual

## 2023-03-06 ENCOUNTER — Encounter: Payer: Self-pay | Admitting: Family

## 2023-03-06 ENCOUNTER — Telehealth: Payer: BC Managed Care – PPO | Admitting: Family

## 2023-03-06 NOTE — Progress Notes (Deleted)
MyChart Video Visit    Virtual Visit via Video Note   This format is felt to be most appropriate for this patient at this time. Physical exam was limited by quality of the video and audio technology used for the visit. CMA was able to get the patient set up on a video visit.  Patient location: Home. Patient and provider in visit Provider location: Office  I discussed the limitations of evaluation and management by telemedicine and the availability of in person appointments. The patient expressed understanding and agreed to proceed.  Visit Date: 03/06/2023  Today's healthcare provider: Dulce Sellar, NP     Subjective:   Patient ID: Shelby Rubio, female    DOB: 12/08/2000, 22 y.o.   MRN: [id number]  Chief Complaint  Patient presents with   Depression    HPI Depression:  previous HX:  pt reported having new sx -  not wanting to get out of bed, stated not sure why she is feeling this way, but also started binge eating & not sure if d/t diabetes or depression. Denies any changes in relationships or work to cause sx. Started on Prozac 10mg  but it made her more moody. Switched to Effexor 37.5mg .   Assessment & Plan:  Moderate episode of recurrent major depressive disorder [facility])    Past Medical History:  Diagnosis Date   Abnormal menses 06/06/2018   Anxiety 2011?   When diagnosed depressed   Asthma    When i was little   Behavior disorder    Diabetes mellitus without complication (HCC) 09/2021   Type 2   Dizziness 03/01/2019   History of hypoglycemia 03/01/2019   Lower abdominal pain 06/06/2018   Refused influenza vaccine 06/06/2018    Past Surgical History:  Procedure Laterality Date   ADENOIDECTOMY     TONSILLECTOMY     tubes in ears      Outpatient Medications Prior to Visit  Medication Sig Dispense Refill   cetirizine (ZYRTEC) 10 MG tablet Take 1 tablet (10 mg total) by mouth daily. 30 tablet 3   fluticasone (FLONASE) 50 MCG/ACT nasal spray Place 2  sprays into both nostrils daily. 16 g 3   glipiZIDE (GLUCOTROL XL) 5 MG 24 hr tablet Take 1 tablet (5 mg total) by mouth daily with breakfast. 90 tablet 1   Insulin Pen Needle (BD PEN NEEDLE NANO U/F) 32G X 4 MM MISC 1 each by Does not apply route 4 (four) times daily. 100 each 2   levonorgestrel (MIRENA) 20 MCG/DAY IUD 1 each by Intrauterine route once.     lisinopril (ZESTRIL) 5 MG tablet Take 1 tablet (5 mg total) by mouth daily. 90 tablet 3   metFORMIN (GLUCOPHAGE-XR) 750 MG 24 hr tablet Take 1 tablet (750 mg total) by mouth 2 (two) times daily after a meal. 180 tablet 3   SEMGLEE, YFGN, 100 UNIT/ML Pen Inject into the skin.     venlafaxine XR (EFFEXOR XR) 37.5 MG 24 hr capsule Take 1 capsule (37.5 mg total) by mouth daily with breakfast. After 2 weeks, increase dose to 2 pills daily if needed. 60 capsule 1   insulin glargine (LANTUS) 100 UNIT/ML Solostar Pen Inject 20 Units into the skin at bedtime. 15 mL 1   No facility-administered medications prior to visit.    Allergies  Allergen Reactions   Penicillins Hives   Amoxil [Amoxicillin] Hives       Objective:   Physical Exam Vitals and nursing note reviewed.  Constitutional:  General: Pt is not in acute distress.    Appearance: Normal appearance.  HENT:     Head: Normocephalic.  Pulmonary:     Effort: No respiratory distress.  Musculoskeletal:     Cervical back: Normal range of motion.  Skin:    General: Skin is dry.     Coloration: Skin is not pale.  Neurological:     Mental Status: Pt is alert and oriented to person, place, and time.  Psychiatric:        Mood and Affect: Mood normal.   Ht 5\' 4"  (1.626 m)   BMI 31.79 kg/m   Wt Readings from Last 3 Encounters:  01/02/23 185 lb 3.2 oz (84 kg)  11/15/22 182 lb 6 oz (82.7 kg)  11/03/22 182 lb 12.8 oz (82.9 kg)        I discussed the assessment and treatment plan with the patient. The patient was provided an opportunity to ask questions and all were answered.  The patient agreed with the plan and demonstrated an understanding of the instructions.   The patient was advised to call back or seek an in-person evaluation if the symptoms worsen or if the condition fails to improve as anticipated.  Dulce Sellar, NP [facility] [place] [phone] (phone) [phone] (fax)  [facility]

## 2023-03-06 NOTE — Assessment & Plan Note (Signed)
chronic didn't tolerate Prozac switched to Effexor 37.5mg , states this has helped her depression, no SE, reports anxiety is still present pt ran out of pills 2 weeks ago, could not get appt for f/u until now advised to restart daily for 2 weeks, if sx are still not at goal, ok to increase to 2 capsules qd if needed f/u in 1-2 mo - ok if virtual

## 2023-03-13 NOTE — Progress Notes (Signed)
cancelled visit 

## 2023-06-15 DIAGNOSIS — B370 Candidal stomatitis: Secondary | ICD-10-CM | POA: Diagnosis not present

## 2023-06-15 DIAGNOSIS — J069 Acute upper respiratory infection, unspecified: Secondary | ICD-10-CM | POA: Diagnosis not present

## 2023-06-15 DIAGNOSIS — R059 Cough, unspecified: Secondary | ICD-10-CM | POA: Diagnosis not present

## 2023-06-15 DIAGNOSIS — R030 Elevated blood-pressure reading, without diagnosis of hypertension: Secondary | ICD-10-CM | POA: Diagnosis not present

## 2023-07-18 ENCOUNTER — Encounter: Payer: Self-pay | Admitting: Family

## 2023-07-18 NOTE — Telephone Encounter (Signed)
 Care team updated and letter sent for eye exam notes.

## 2023-09-11 ENCOUNTER — Encounter: Payer: Self-pay | Admitting: Family

## 2023-09-19 ENCOUNTER — Ambulatory Visit: Payer: Self-pay | Admitting: Family

## 2023-09-19 ENCOUNTER — Ambulatory Visit (INDEPENDENT_AMBULATORY_CARE_PROVIDER_SITE_OTHER): Payer: BC Managed Care – PPO | Admitting: Family

## 2023-09-19 VITALS — BP 121/82 | HR 109 | Temp 98.6°F | Ht 64.0 in | Wt 156.8 lb

## 2023-09-19 DIAGNOSIS — F331 Major depressive disorder, recurrent, moderate: Secondary | ICD-10-CM

## 2023-09-19 DIAGNOSIS — E1165 Type 2 diabetes mellitus with hyperglycemia: Secondary | ICD-10-CM

## 2023-09-19 DIAGNOSIS — Z794 Long term (current) use of insulin: Secondary | ICD-10-CM

## 2023-09-19 LAB — POCT GLYCOSYLATED HEMOGLOBIN (HGB A1C): Hemoglobin A1C: 13.7 % — AB (ref 4.0–5.6)

## 2023-09-19 LAB — COMPREHENSIVE METABOLIC PANEL
ALT: 14 U/L (ref 0–35)
AST: 13 U/L (ref 0–37)
Albumin: 4.6 g/dL (ref 3.5–5.2)
Alkaline Phosphatase: 64 U/L (ref 39–117)
BUN: 8 mg/dL (ref 6–23)
CO2: 18 meq/L — ABNORMAL LOW (ref 19–32)
Calcium: 8.6 mg/dL (ref 8.4–10.5)
Chloride: 103 meq/L (ref 96–112)
Creatinine, Ser: 0.52 mg/dL (ref 0.40–1.20)
GFR: 129.94 mL/min (ref >60.00)
Glucose, Bld: 271 mg/dL — ABNORMAL HIGH (ref 70–99)
Potassium: 4 meq/L (ref 3.5–5.1)
Sodium: 135 meq/L (ref 135–145)
Total Bilirubin: 0.4 mg/dL (ref 0.2–1.2)
Total Protein: 7.5 g/dL (ref 6.0–8.3)

## 2023-09-19 MED ORDER — SEMGLEE (YFGN) 100 UNIT/ML ~~LOC~~ SOPN: 20.0000 [IU] | PEN_INJECTOR | Freq: Every day | SUBCUTANEOUS | 5 refills | Status: DC

## 2023-09-19 MED ORDER — LISINOPRIL 2.5 MG PO TABS: 2.5000 mg | ORAL_TABLET | Freq: Every day | ORAL | 1 refills | Status: DC

## 2023-09-19 MED ORDER — VENLAFAXINE HCL ER 37.5 MG PO CP24
37.5000 mg | ORAL_CAPSULE | Freq: Every day | ORAL | 1 refills | Status: DC
Start: 2023-09-19 — End: 2024-03-11

## 2023-09-19 MED ORDER — BD PEN NEEDLE NANO U/F 32G X 4 MM MISC: 1.0000 | Freq: Every day | 2 refills | Status: DC

## 2023-09-19 MED ORDER — GLIPIZIDE ER 5 MG PO TB24: 5.0000 mg | ORAL_TABLET | Freq: Every day | ORAL | 2 refills | Status: DC

## 2023-09-19 NOTE — Progress Notes (Unsigned)
   Patient ID: Shelby Rubio, female    DOB: 05-26-2001, 23 y.o.   MRN: [id number]  Chief Complaint  Patient presents with  . Type 2 diabetes mellitus with obesity            Assessment & Plan:   Subjective:    Outpatient Medications Prior to Visit  Medication Sig Dispense Refill  . cetirizine (ZYRTEC) 10 MG tablet Take 1 tablet (10 mg total) by mouth daily. 30 tablet 3  . levonorgestrel (MIRENA) 20 MCG/DAY IUD 1 each by Intrauterine route once.    . metFORMIN (GLUCOPHAGE-XR) 750 MG 24 hr tablet Take 1 tablet (750 mg total) by mouth 2 (two) times daily after a meal. 180 tablet 3  . fluticasone (FLONASE) 50 MCG/ACT nasal spray Place 2 sprays into both nostrils daily. 16 g 3  . glipiZIDE (GLUCOTROL XL) 5 MG 24 hr tablet Take 1 tablet (5 mg total) by mouth daily with breakfast. 90 tablet 1  . Insulin Pen Needle (BD PEN NEEDLE NANO U/F) 32G X 4 MM MISC 1 each by Does not apply route 4 (four) times daily. 100 each 2  . lisinopril (ZESTRIL) 5 MG tablet Take 1 tablet (5 mg total) by mouth daily. 90 tablet 3  . SEMGLEE, YFGN, 100 UNIT/ML Pen Inject into the skin.    Marland Kitchen venlafaxine XR (EFFEXOR XR) 37.5 MG 24 hr capsule Take 1 capsule (37.5 mg total) by mouth daily with breakfast. After 2 weeks, increase dose to 2 pills daily if needed. 60 capsule 1   No facility-administered medications prior to visit.   Past Medical History:  Diagnosis Date  . Abnormal menses 06/06/2018  . Anxiety 2011?   When diagnosed depressed  . Asthma    When i was little  . Behavior disorder   . Diabetes mellitus without complication (HCC) 09/2021   Type 2  . Dizziness 03/01/2019  . History of hypoglycemia 03/01/2019  . Lower abdominal pain 06/06/2018  . Refused influenza vaccine 06/06/2018   Past Surgical History:  Procedure Laterality Date  . ADENOIDECTOMY    . TONSILLECTOMY    . tubes in ears     Allergies  Allergen Reactions  . Penicillins Hives  . Amoxil [Amoxicillin] Hives      Objective:     Physical Exam Vitals and nursing note reviewed.  Constitutional:      Appearance: Normal appearance.  Cardiovascular:     Rate and Rhythm: Normal rate and regular rhythm.  Pulmonary:     Effort: Pulmonary effort is normal.     Breath sounds: Normal breath sounds.  Musculoskeletal:        General: Normal range of motion.  Skin:    General: Skin is warm and dry.  Neurological:     Mental Status: She is alert.  Psychiatric:        Mood and Affect: Mood normal.        Behavior: Behavior normal.   BP 121/82 (BP Location: Left Arm, Patient Position: Sitting, Cuff Size: Normal)   Pulse (!) 109   Temp 98.6 F (37 C) (Temporal)   Ht 5\' 4"  (1.626 m)   Wt 156 lb 12.8 oz (71.1 kg)   SpO2 99%   BMI 26.91 kg/m  Wt Readings from Last 3 Encounters:  09/19/23 156 lb 12.8 oz (71.1 kg)  01/02/23 185 lb 3.2 oz (84 kg)  11/15/22 182 lb 6 oz (82.7 kg)      Dulce Sellar, NP

## 2023-09-19 NOTE — Telephone Encounter (Signed)
 I returned call to pharmacy, pharmacist stated we would need to remove DAW 1 and PCP stated ok to switch to Lantus. Pharmacist verbalized understanding.

## 2023-09-19 NOTE — Telephone Encounter (Signed)
 [name] with [facility] states that  [facility], YFGN, 100 UNIT/ML Pen is unavailable and wants to if it can be switched Lantus instead.  Copied from CRM [id number]. Topic: Clinical - Prescription Issue >> Sep 19, 2023 11:50 AM Denese Killings wrote: Reason for CRM: [name] with [facility] states that  [facility], YFGN, 100 UNIT/ML Pen is unavailable and wants to if it can be switched Lantus instead. Reason for Disposition  [1] Caller requesting NON-URGENT health information AND [2] PCP's office is the best resource  Answer Assessment - Initial Assessment Questions 1. REASON FOR CALL or QUESTION: "What is your reason for calling today?" or "How can I best help you?" or "What question do you have that I can help answer?"     [name] with [facility] states that  [facility], YFGN, 100 UNIT/ML Pen is unavailable and wants to if it can be switched Lantus instead.  Protocols used: Information Only Call - No Triage-A-AH

## 2023-09-20 LAB — C-PEPTIDE: C-Peptide: 0.68 ng/mL — ABNORMAL LOW (ref 0.80–3.85)

## 2023-09-21 ENCOUNTER — Telehealth: Payer: Self-pay

## 2023-09-21 ENCOUNTER — Other Ambulatory Visit (HOSPITAL_COMMUNITY): Payer: Self-pay

## 2023-09-21 ENCOUNTER — Encounter: Payer: Self-pay | Admitting: Family

## 2023-09-21 NOTE — Assessment & Plan Note (Signed)
 Despite weight loss and dietary changes, A1c is elevated at 13.7. Patient has been off insulin and glimepiride for approximately a month. Unclear if patient is Type 1 or Type 2 diabetic. -Obtain a new glucometer and monitor blood glucose levels, pt prefers to buy OTC as cheaper. -Order C-peptide to assess endogenous insulin production. -Refill Lisinopril 2.5mg  qd for kidney protection. -Resume Semglee insulin at 20 units daily, with instructions to increase by 2 units daily until fasting blood glucose is below 130. Check blood glucose before supper, aim for below 170 for now. -Resume Glipizide, 30 tablets for now until C-peptide results are available. -Resume Lisinopril at a lower dose for kidney protection. -Resume Metformn if C-peptide results are favorable. -Message blood glucose levels via MyChart after 1 week. -F/U in 2 months

## 2023-09-21 NOTE — Assessment & Plan Note (Signed)
 Patient ran out of Venlafaxine (Effexor) and reports it was helping with symptoms. -Resume Venlafaxine at one capsule daily, 37.5mg . -F/U in 3-46mos

## 2023-09-21 NOTE — Telephone Encounter (Signed)
 Pharmacy Patient Advocate Encounter   Received notification from Onbase that prior authorization for glipiZIDE ER 5MG  er tablets is required/requested.   Insurance verification completed.   The patient is insured through [employer] .   Per test claim: PA required; PA submitted to above mentioned insurance via CoverMyMeds Key/confirmation #/EOC [id number] Status is pending

## 2023-09-21 NOTE — Addendum Note (Signed)
 Addended byDulce Sellar on: 09/21/2023 10:11 PM   Modules accepted: Orders

## 2023-09-25 NOTE — Telephone Encounter (Signed)
 Pharmacy Patient Advocate Encounter  Received notification from [facility] that Prior Authorization for Glipizide ER 5mg  tabs has been APPROVED from 09/21/23 to 09/20/24   PA #/Case ID/Reference #: [id number]  Approval letter indexed to media tab

## 2023-09-29 NOTE — Telephone Encounter (Signed)
 Patient aware Rx  Glipizide ER 5mg  tabs has been APPROVED from 09/21/23 to 09/20/24

## 2023-10-25 ENCOUNTER — Ambulatory Visit: Payer: BC Managed Care – PPO | Admitting: Family

## 2023-11-20 ENCOUNTER — Ambulatory Visit: Payer: BC Managed Care – PPO | Admitting: Family

## 2024-01-11 ENCOUNTER — Ambulatory Visit: Payer: Self-pay | Admitting: Family

## 2024-01-15 NOTE — Telephone Encounter (Signed)
 Yes, she needs an OV, I need to see her skin up close and why did she not take her insulin ? money? or no refills? we can send in refills if needed. Thanks

## 2024-03-11 ENCOUNTER — Encounter (HOSPITAL_COMMUNITY): Payer: Self-pay

## 2024-03-11 ENCOUNTER — Inpatient Hospital Stay (HOSPITAL_COMMUNITY)
Admission: EM | Admit: 2024-03-11 | Discharge: 2024-03-15 | DRG: 638 | Disposition: A | Discharge status: Home / Self Care | Attending: Internal Medicine | Admitting: Internal Medicine

## 2024-03-11 ENCOUNTER — Other Ambulatory Visit: Payer: Self-pay

## 2024-03-11 ENCOUNTER — Telehealth

## 2024-03-11 ENCOUNTER — Emergency Department (HOSPITAL_COMMUNITY)

## 2024-03-11 DIAGNOSIS — E1010 Type 1 diabetes mellitus with ketoacidosis without coma: Secondary | ICD-10-CM | POA: Diagnosis not present

## 2024-03-11 DIAGNOSIS — R000 Tachycardia, unspecified: Secondary | ICD-10-CM | POA: Diagnosis not present

## 2024-03-11 DIAGNOSIS — F331 Major depressive disorder, recurrent, moderate: Secondary | ICD-10-CM | POA: Diagnosis not present

## 2024-03-11 DIAGNOSIS — R531 Weakness: Secondary | ICD-10-CM | POA: Diagnosis not present

## 2024-03-11 DIAGNOSIS — I4891 Unspecified atrial fibrillation: Secondary | ICD-10-CM | POA: Diagnosis not present

## 2024-03-11 DIAGNOSIS — Z811 Family history of alcohol abuse and dependence: Secondary | ICD-10-CM | POA: Diagnosis not present

## 2024-03-11 DIAGNOSIS — N179 Acute kidney failure, unspecified: Secondary | ICD-10-CM

## 2024-03-11 DIAGNOSIS — I152 Hypertension secondary to endocrine disorders: Secondary | ICD-10-CM | POA: Diagnosis present

## 2024-03-11 DIAGNOSIS — E860 Dehydration: Secondary | ICD-10-CM | POA: Diagnosis present

## 2024-03-11 DIAGNOSIS — Z5986 Financial insecurity: Secondary | ICD-10-CM | POA: Diagnosis not present

## 2024-03-11 DIAGNOSIS — Z823 Family history of stroke: Secondary | ICD-10-CM

## 2024-03-11 DIAGNOSIS — R0602 Shortness of breath: Secondary | ICD-10-CM | POA: Diagnosis not present

## 2024-03-11 DIAGNOSIS — E1059 Type 1 diabetes mellitus with other circulatory complications: Secondary | ICD-10-CM | POA: Diagnosis not present

## 2024-03-11 DIAGNOSIS — E876 Hypokalemia: Secondary | ICD-10-CM | POA: Diagnosis not present

## 2024-03-11 DIAGNOSIS — Z794 Long term (current) use of insulin: Secondary | ICD-10-CM | POA: Diagnosis not present

## 2024-03-11 DIAGNOSIS — Z880 Allergy status to penicillin: Secondary | ICD-10-CM

## 2024-03-11 DIAGNOSIS — Z8261 Family history of arthritis: Secondary | ICD-10-CM

## 2024-03-11 DIAGNOSIS — Z7984 Long term (current) use of oral hypoglycemic drugs: Secondary | ICD-10-CM

## 2024-03-11 DIAGNOSIS — E1159 Type 2 diabetes mellitus with other circulatory complications: Secondary | ICD-10-CM | POA: Diagnosis present

## 2024-03-11 DIAGNOSIS — E871 Hypo-osmolality and hyponatremia: Secondary | ICD-10-CM | POA: Diagnosis not present

## 2024-03-11 DIAGNOSIS — Z825 Family history of asthma and other chronic lower respiratory diseases: Secondary | ICD-10-CM | POA: Diagnosis not present

## 2024-03-11 DIAGNOSIS — F17200 Nicotine dependence, unspecified, uncomplicated: Secondary | ICD-10-CM | POA: Diagnosis present

## 2024-03-11 DIAGNOSIS — F17210 Nicotine dependence, cigarettes, uncomplicated: Secondary | ICD-10-CM | POA: Diagnosis present

## 2024-03-11 DIAGNOSIS — Z8249 Family history of ischemic heart disease and other diseases of the circulatory system: Secondary | ICD-10-CM | POA: Diagnosis not present

## 2024-03-11 DIAGNOSIS — E1065 Type 1 diabetes mellitus with hyperglycemia: Secondary | ICD-10-CM | POA: Diagnosis present

## 2024-03-11 DIAGNOSIS — E1110 Type 2 diabetes mellitus with ketoacidosis without coma: Secondary | ICD-10-CM | POA: Diagnosis not present

## 2024-03-11 DIAGNOSIS — Z79899 Other long term (current) drug therapy: Secondary | ICD-10-CM

## 2024-03-11 DIAGNOSIS — F419 Anxiety disorder, unspecified: Secondary | ICD-10-CM | POA: Diagnosis present

## 2024-03-11 DIAGNOSIS — Z833 Family history of diabetes mellitus: Secondary | ICD-10-CM

## 2024-03-11 DIAGNOSIS — D72823 Leukemoid reaction: Secondary | ICD-10-CM | POA: Diagnosis present

## 2024-03-11 LAB — GLUCOSE, CAPILLARY
Glucose-Capillary: 173 mg/dL — ABNORMAL HIGH (ref 70–99)
Glucose-Capillary: 182 mg/dL — ABNORMAL HIGH (ref 70–99)
Glucose-Capillary: 182 mg/dL — ABNORMAL HIGH (ref 70–99)
Glucose-Capillary: 184 mg/dL — ABNORMAL HIGH (ref 70–99)
Glucose-Capillary: 208 mg/dL — ABNORMAL HIGH (ref 70–99)

## 2024-03-11 LAB — BASIC METABOLIC PANEL WITH GFR
BUN: 8 mg/dL (ref 6–20)
BUN: 7 mg/dL (ref 6–20)
CO2: <7 mmol/L — ABNORMAL LOW (ref 22–32)
CO2: <7 mmol/L — ABNORMAL LOW (ref 22–32)
Calcium: 8.3 mg/dL — ABNORMAL LOW (ref 8.9–10.3)
Calcium: 7.8 mg/dL — ABNORMAL LOW (ref 8.9–10.3)
Chloride: 104 mmol/L (ref 98–111)
Chloride: 109 mmol/L (ref 98–111)
Creatinine, Ser: 0.76 mg/dL (ref 0.44–1.00)
Creatinine, Ser: 0.64 mg/dL (ref 0.44–1.00)
GFR, Estimated: >60 mL/min (ref >60)
GFR, Estimated: >60 mL/min (ref >60)
Glucose, Bld: 305 mg/dL — ABNORMAL HIGH (ref 70–99)
Glucose, Bld: 221 mg/dL — ABNORMAL HIGH (ref 70–99)
Potassium: 3.4 mmol/L — ABNORMAL LOW (ref 3.5–5.1)
Potassium: 3 mmol/L — ABNORMAL LOW (ref 3.5–5.1)
Sodium: 131 mmol/L — ABNORMAL LOW (ref 135–145)
Sodium: 134 mmol/L — ABNORMAL LOW (ref 135–145)

## 2024-03-11 LAB — CBC WITH DIFFERENTIAL/PLATELET
Abs Immature Granulocytes: 0.2 K/uL — ABNORMAL HIGH (ref 0.00–0.07)
Basophils Absolute: 0.2 K/uL — ABNORMAL HIGH (ref 0.0–0.1)
Basophils Relative: 1 %
Eosinophils Absolute: 0.1 K/uL (ref 0.0–0.5)
Eosinophils Relative: 0 %
HCT: 52.6 % — ABNORMAL HIGH (ref 36.0–46.0)
Hemoglobin: 18 g/dL — ABNORMAL HIGH (ref 12.0–15.0)
Immature Granulocytes: 1 %
Lymphocytes Relative: 7 %
Lymphs Abs: 2 K/uL (ref 0.7–4.0)
MCH: 32.8 pg (ref 26.0–34.0)
MCHC: 34.2 g/dL (ref 30.0–36.0)
MCV: 95.8 fL (ref 80.0–100.0)
Monocytes Absolute: 1.9 K/uL — ABNORMAL HIGH (ref 0.1–1.0)
Monocytes Relative: 7 %
Neutro Abs: 23 K/uL — ABNORMAL HIGH (ref 1.7–7.7)
Neutrophils Relative %: 84 %
Platelets: 506 K/uL — ABNORMAL HIGH (ref 150–400)
RBC: 5.49 MIL/uL — ABNORMAL HIGH (ref 3.87–5.11)
RDW: 12 % (ref 11.5–15.5)
Smear Review: NORMAL
WBC: 27.2 K/uL — ABNORMAL HIGH (ref 4.0–10.5)
nRBC: 0 % (ref 0.0–0.2)

## 2024-03-11 LAB — BLOOD GAS, VENOUS
Acid-base deficit: 29.8 mmol/L — ABNORMAL HIGH (ref 0.0–2.0)
Bicarbonate: 3.2 mmol/L — ABNORMAL LOW (ref 20.0–28.0)
Drawn by: 52113
O2 Saturation: 80.7 %
Patient temperature: 36.2
pCO2, Ven: 18 mmHg — CL (ref 44–60)
pH, Ven: <6.95 — CL (ref 7.25–7.43)
pO2, Ven: 44 mmHg (ref 32–45)

## 2024-03-11 LAB — CBG MONITORING, ED
Glucose-Capillary: 288 mg/dL — ABNORMAL HIGH (ref 70–99)
Glucose-Capillary: 284 mg/dL — ABNORMAL HIGH (ref 70–99)
Glucose-Capillary: 229 mg/dL — ABNORMAL HIGH (ref 70–99)
Glucose-Capillary: 180 mg/dL — ABNORMAL HIGH (ref 70–99)

## 2024-03-11 LAB — TSH: TSH: 0.987 u[IU]/mL (ref 0.350–4.500)

## 2024-03-11 LAB — MRSA NEXT GEN BY PCR, NASAL: MRSA by PCR Next Gen: NOT DETECTED

## 2024-03-11 LAB — BETA-HYDROXYBUTYRIC ACID
Beta-Hydroxybutyric Acid: >8 mmol/L — ABNORMAL HIGH (ref 0.05–0.27)
Beta-Hydroxybutyric Acid: 7.96 mmol/L — ABNORMAL HIGH (ref 0.05–0.27)

## 2024-03-11 MED ORDER — POLYETHYLENE GLYCOL 3350 17 G PO PACK
17.0000 g | PACK | Freq: Every day | ORAL | Status: DC | PRN
Start: 2024-03-11 — End: 2024-03-15

## 2024-03-11 MED ORDER — LACTATED RINGERS IV BOLUS
20.0000 mL/kg | Freq: Once | INTRAVENOUS | Status: AC
  Administered 2024-03-11 (×2): 1342 mL via INTRAVENOUS

## 2024-03-11 MED ORDER — POTASSIUM CHLORIDE 10 MEQ/100ML IV SOLN
10.0000 meq | INTRAVENOUS | Status: DC
  Administered 2024-03-11 (×4): 10 meq via INTRAVENOUS
  Filled 2024-03-11 (×2): qty 100

## 2024-03-11 MED ORDER — INSULIN REGULAR(HUMAN) IN NACL 100-0.9 UT/100ML-% IV SOLN
INTRAVENOUS | Status: DC
  Administered 2024-03-11 (×2): 4.8 [IU]/h via INTRAVENOUS
  Administered 2024-03-12 (×2): 3.2 [IU]/h via INTRAVENOUS
  Administered 2024-03-13 (×2): 3.8 [IU]/h via INTRAVENOUS
  Filled 2024-03-11 (×2): qty 100

## 2024-03-11 MED ORDER — INSULIN REGULAR(HUMAN) IN NACL 100-0.9 UT/100ML-% IV SOLN
INTRAVENOUS | Status: DC
  Administered 2024-03-11 (×2): 9 [IU]/h via INTRAVENOUS
  Filled 2024-03-11: qty 100

## 2024-03-11 MED ORDER — LACTATED RINGERS IV SOLN: INTRAVENOUS | Status: AC

## 2024-03-11 MED ORDER — ONDANSETRON HCL 4 MG/2ML IJ SOLN: 4.0000 mg | Freq: Four times a day (QID) | INTRAMUSCULAR | Status: DC | PRN

## 2024-03-11 MED ORDER — CHLORHEXIDINE GLUCONATE CLOTH 2 % EX PADS
6.0000 | MEDICATED_PAD | Freq: Every day | CUTANEOUS | Status: DC
  Administered 2024-03-12 – 2024-03-15 (×6): 6 via TOPICAL

## 2024-03-11 MED ORDER — ONDANSETRON HCL 4 MG PO TABS: 4.0000 mg | ORAL_TABLET | Freq: Four times a day (QID) | ORAL | Status: DC | PRN

## 2024-03-11 MED ORDER — DEXTROSE 50 % IV SOLN: 0.0000 mL | INTRAVENOUS | Status: DC | PRN

## 2024-03-11 MED ORDER — POTASSIUM CHLORIDE 10 MEQ/100ML IV SOLN
10.0000 meq | INTRAVENOUS | Status: AC
  Administered 2024-03-11 (×8): 10 meq via INTRAVENOUS
  Filled 2024-03-11 (×3): qty 100

## 2024-03-11 MED ORDER — ONDANSETRON HCL 4 MG/2ML IJ SOLN
4.0000 mg | Freq: Once | INTRAMUSCULAR | Status: AC
  Administered 2024-03-11 (×2): 4 mg via INTRAVENOUS
  Filled 2024-03-11: qty 2

## 2024-03-11 MED ORDER — ORAL CARE MOUTH RINSE: 15.0000 mL | OROMUCOSAL | Status: DC | PRN

## 2024-03-11 MED ORDER — ENOXAPARIN SODIUM 40 MG/0.4ML IJ SOSY
40.0000 mg | PREFILLED_SYRINGE | INTRAMUSCULAR | Status: DC
  Administered 2024-03-11 – 2024-03-14 (×7): 40 mg via SUBCUTANEOUS
  Filled 2024-03-11 (×4): qty 0.4

## 2024-03-11 MED ORDER — DEXTROSE IN LACTATED RINGERS 5 % IV SOLN: INTRAVENOUS | Status: AC

## 2024-03-11 MED ORDER — FENTANYL CITRATE (PF) 100 MCG/2ML IJ SOLN
12.5000 ug | INTRAMUSCULAR | Status: DC | PRN
  Administered 2024-03-11 (×2): 50 ug via INTRAVENOUS
  Filled 2024-03-11: qty 2

## 2024-03-11 NOTE — ED Notes (Signed)
 ED TO INPATIENT HANDOFF REPORT  ED Nurse Name and Phone #:   S Name/Age/Gender Rexene KANDICE Clan 23 y.o. female Room/Bed: APA04/APA04  Code Status   Code Status: Prior  Home/SNF/Other Home Patient oriented to: self, place, time, and situation Is this baseline? Yes   Triage Complete: Triage complete  Chief Complaint DKA (diabetic ketoacidosis) (HCC) [E11.10]  Triage Note Patient came in via [facility] for overall not feeling well. Patient is type 1 diabetic. CBG is 288. Patient states that she is SOB, throat and ear hurting. Vomiting for the past 3 days.   Allergies Allergies  Allergen Reactions   Penicillins Hives   Amoxil [Amoxicillin] Hives    Level of Care/Admitting Diagnosis ED Disposition     ED Disposition  Admit   Condition  --   Comment  Hospital Area: [facility] [[id number]]  Level of Care: ICU [6]  Covid Evaluation: Asymptomatic - no recent exposure (last 10 days) testing not required  Diagnosis: DKA (diabetic ketoacidosis) [facility]) [[id number]]  Admitting Physician: PRATT, TANYA S [[id number]]  Attending Physician: PRATT, TANYA S [[id number]]  Certification:: I certify this patient will need inpatient services for at least 2 midnights  Expected Medical Readiness: 03/15/2024          B Medical/Surgery History Past Medical History:  Diagnosis Date   Abnormal menses 06/06/2018   Anxiety 2011?   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   DX in 09/2021, pt has struggled w/med compliance, but has lost weight on her own     Past Surgical History:  Procedure Laterality Date    ADENOIDECTOMY     TONSILLECTOMY     tubes in ears       A IV Location/Drains/Wounds Patient Lines/Drains/Airways Status     Active Line/Drains/Airways     Name Placement date Placement time Site Days   Peripheral IV 03/11/24 20 G Distal;Left;Posterior Forearm 03/11/24  1453  Forearm  less than 1   Peripheral IV 03/11/24 20 G Right Antecubital 03/11/24  1454  Antecubital  less than 1            Intake/Output Last 24 hours No intake or output data in the 24 hours ending 03/11/24 1802  Labs/Imaging Results for orders placed or performed during the hospital encounter of 03/11/24 (from the past 48 hours)  CBG monitoring, ED     Status: Abnormal   Collection Time: 03/11/24  2:41 PM  Result Value Ref Range   Glucose-Capillary 288 (H) 70 - 99 mg/dL    Comment: Glucose reference range applies only to samples taken after fasting for at least 8 hours.  Basic metabolic panel     Status: Abnormal   Collection Time: 03/11/24  2:55 PM  Result Value Ref Range   Sodium 131 (L) 135 - 145 mmol/L   Potassium 3.4 (L) 3.5 - 5.1 mmol/L   Chloride 104 98 - 111 mmol/L   CO2 <7 (L) 22 - 32 mmol/L   Glucose, Bld 305 (H) 70 - 99 mg/dL    Comment: Glucose reference range applies only to samples taken after fasting  for at least 8 hours.   BUN 8 6 - 20 mg/dL   Creatinine, Ser 9.23 0.44 - 1.00 mg/dL   Calcium 8.3 (L) 8.9 - 10.3 mg/dL   GFR, Estimated >39 >39 mL/min    Comment: (NOTE) Calculated using the CKD-EPI Creatinine Equation (2021)    Anion gap NOT CALCULATED 5 - 15    Comment: Electrolytes repeated to confirm. Electrolytes repeated to confirm. Performed at [facility], [address], [place], [place] [address]   Beta-hydroxybutyric acid     Status: Abnormal   Collection Time: 03/11/24  2:55 PM  Result Value Ref Range   Beta-Hydroxybutyric Acid >8.00 (H) 0.05 - 0.27 mmol/L    Comment: RESULTS CONFIRMED BY MANUAL DILUTION Performed at [facility], [address], [place], [place]  [address]   CBC with Differential (PNL)     Status: Abnormal   Collection Time: 03/11/24  2:55 PM  Result Value Ref Range   WBC 27.2 (H) 4.0 - 10.5 K/uL   RBC 5.49 (H) 3.87 - 5.11 MIL/uL   Hemoglobin 18.0 (H) 12.0 - 15.0 g/dL   HCT 47.3 (H) 63.9 - 53.9 %   MCV 95.8 80.0 - 100.0 fL   MCH 32.8 26.0 - 34.0 pg   MCHC 34.2 30.0 - 36.0 g/dL   RDW 87.9 88.4 - 84.4 %   Platelets 506 (H) 150 - 400 K/uL   nRBC 0.0 0.0 - 0.2 %   Neutrophils Relative % 84 %   Neutro Abs 23.0 (H) 1.7 - 7.7 K/uL   Lymphocytes Relative 7 %   Lymphs Abs 2.0 0.7 - 4.0 K/uL   Monocytes Relative 7 %   Monocytes Absolute 1.9 (H) 0.1 - 1.0 K/uL   Eosinophils Relative 0 %   Eosinophils Absolute 0.1 0.0 - 0.5 K/uL   Basophils Relative 1 %   Basophils Absolute 0.2 (H) 0.0 - 0.1 K/uL   RBC Morphology MORPHOLOGY UNREMARKABLE    Smear Review Normal platelet morphology    Immature Granulocytes 1 %   Abs Immature Granulocytes 0.20 (H) 0.00 - 0.07 K/uL   Reactive, Benign Lymphocytes PRESENT     Comment: Performed at [facility], [address] [place], [place] [address]  Blood gas, venous     Status: Abnormal   Collection Time: 03/11/24  3:10 PM  Result Value Ref Range   pH, Ven <6.95 (LL) 7.25 - 7.43    Comment: CRITICAL RESULT CALLED TO, READ BACK BY AND VERIFIED WITH: B Janielle Mittelstadt AT 1542 ON [id number] BY S DALTON    pCO2, Ven 18 (LL) 44 - 60 mmHg    Comment: CRITICAL RESULT CALLED TO, READ BACK BY AND VERIFIED WITH: B Gabrien Mentink AT 1542 ON [id number] BY S DALTON    pO2, Ven 44 32 - 45 mmHg   Bicarbonate 3.2 (L) 20.0 - 28.0 mmol/L   Acid-base deficit 29.8 (H) 0.0 - 2.0 mmol/L   O2 Saturation 80.7 %   Patient temperature 36.2    Collection site RIGHT ANTECUBITAL    Drawn by [id number]     Comment: Performed at [facility], [address] [place], [place] [address]  CBG monitoring, ED     Status: Abnormal   Collection Time: 03/11/24  4:36 PM  Result Value Ref Range   Glucose-Capillary 284 (H) 70 - 99 mg/dL    Comment:  Glucose reference range applies only to samples taken after fasting for at least 8 hours.  CBG monitoring, ED     Status: Abnormal  Collection Time: 03/11/24  5:41 PM  Result Value Ref Range   Glucose-Capillary 229 (H) 70 - 99 mg/dL    Comment: Glucose reference range applies only to samples taken after fasting for at least 8 hours.   DG Chest Portable 1 View Result Date: 03/11/2024 CLINICAL DATA:  Shortness of breath. EXAM: PORTABLE CHEST 1 VIEW COMPARISON:  03/27/2022 FINDINGS: The lungs are clear without focal pneumonia, edema, pneumothorax or pleural effusion. The cardiopericardial silhouette is within normal limits for size. No acute bony abnormality. Radiopaque foreign body superimposed on the left coracoid process is indeterminate and may be external to the patient. Telemetry leads overlie the chest. IMPRESSION: No active disease. Electronically Signed   By: Camellia Candle M.D.   On: 03/11/2024 16:19    Pending Labs Unresulted Labs (From admission, onward)     Start     Ordered   03/11/24 1732  Basic metabolic panel  ONCE - STAT,   STAT        03/11/24 1732   03/11/24 1455  Basic metabolic panel  (Diabetes Ketoacidosis (DKA))  STAT Now then every 4 hours ,   STAT      03/11/24 1455   03/11/24 1455  Beta-hydroxybutyric acid  (Diabetes Ketoacidosis (DKA))  Now then every 4 hours,   STAT (with URGENT occurrences)      03/11/24 1455   03/11/24 1455  Urinalysis, Routine w reflex microscopic -Urine, Clean Catch  (Diabetes Ketoacidosis (DKA))  ONCE - STAT,   URGENT       Question:  Specimen Source  Answer:  Urine, Clean Catch   03/11/24 1455            Vitals/Pain Today's Vitals   03/11/24 1446 03/11/24 1545 03/11/24 1645 03/11/24 1700  BP:  138/83 (!) 142/91 128/87  Pulse:  (!) 128 (!) 124 (!) 129  Resp:  (!) 34 (!) 31 (!) 36  Temp:      TempSrc:      SpO2:  100% 100% 100%  Weight: 148 lb (67.1 kg)     Height: 5' 4 (1.626 m)     PainSc:        Isolation Precautions No  active isolations  Medications Medications  insulin  regular, human (MYXREDLIN ) 100 units/ 100 mL infusion (5 Units/hr Intravenous Rate/Dose Change 03/11/24 1744)  lactated ringers  infusion (0 mLs Intravenous Stopped 03/11/24 1746)  dextrose  5 % in lactated ringers  infusion ( Intravenous New Bag/Given 03/11/24 1746)  dextrose  50 % solution 0-50 mL (has no administration in time range)  potassium chloride  10 mEq in 100 mL IVPB (10 mEq Intravenous New Bag/Given 03/11/24 1750)  lactated ringers  bolus 1,342 mL (0 mLs Intravenous Stopped 03/11/24 1624)  ondansetron  (ZOFRAN ) injection 4 mg (4 mg Intravenous Given 03/11/24 1508)    Mobility walks     Focused Assessments    R Recommendations: See Admitting Provider Note  Report given to:   Additional Notes:

## 2024-03-11 NOTE — ED Notes (Signed)
 MD Haviland aware of potassium 3.4 before starting insulin  drip. MD ordered iv potassium

## 2024-03-11 NOTE — Assessment & Plan Note (Signed)
 Consider nicotine  patch

## 2024-03-11 NOTE — Assessment & Plan Note (Signed)
 Presently on no meds.

## 2024-03-11 NOTE — ED Provider Notes (Signed)
 [place] EMERGENCY DEPARTMENT AT [facility] Provider Note   CSN: [id number] Arrival date & time: 03/11/24  1431     Patient presents with: Shortness of Breath   Shelby Rubio is a 23 y.o. female.   Pt is a 24 yo female with pmhx significant for anxiety and depression and DM2, but hx of low c-peptide, so possible dx of dm1.  She has not been able to get her insulin  filled due to lack of money.  Pt has not seen endocrinology due to inability to pay the co-pay.  She has been sob and has been vomiting.        Prior to Admission medications   Medication Sig Start Date End Date Taking? Authorizing Provider  cetirizine  (ZYRTEC ) 10 MG tablet Take 1 tablet (10 mg total) by mouth daily. 03/28/22   Pearlean Manus, MD  fluticasone  (FLONASE ) 50 MCG/ACT nasal spray Place 2 sprays into both nostrils daily. 03/28/22   Pearlean Manus, MD  glipiZIDE  (GLUCOTROL  XL) 5 MG 24 hr tablet Take 1 tablet (5 mg total) by mouth daily with breakfast. 09/19/23   Lucius Krabbe, NP  Insulin  Pen Needle (BD PEN NEEDLE NANO U/F) 32G X 4 MM MISC Inject 1 each into the skin daily. 09/19/23   Lucius Krabbe, NP  levonorgestrel  (MIRENA ) 20 MCG/DAY IUD 1 each by Intrauterine route once.    [provider]  lisinopril  (ZESTRIL ) 2.5 MG tablet Take 1 tablet (2.5 mg total) by mouth daily. 09/19/23   Lucius Krabbe, NP  metFORMIN  (GLUCOPHAGE -XR) 750 MG 24 hr tablet Take 1 tablet (750 mg total) by mouth 2 (two) times daily after a meal. 03/28/22   Emokpae, Courage, MD  SEMGLEE , YFGN, 100 UNIT/ML Pen Inject 20 Units into the skin daily. 09/19/23   Lucius Krabbe, NP  venlafaxine  XR (EFFEXOR  XR) 37.5 MG 24 hr capsule Take 1 capsule (37.5 mg total) by mouth daily with breakfast. After 2 weeks, increase dose to 2 pills daily if needed. 09/19/23   Lucius Krabbe, NP    Allergies: Penicillins and Amoxil [amoxicillin]    Review of Systems  Respiratory:  Positive for shortness of breath.    Gastrointestinal:  Positive for nausea and vomiting.  All other systems reviewed and are negative.   Updated Vital Signs BP 128/87   Pulse (!) 129   Temp (!) 97.4 F (36.3 C) (Axillary)   Resp (!) 36   Ht 5' 4 (1.626 m)   Wt 67.1 kg   SpO2 100%   BMI 25.40 kg/m   Physical Exam Vitals and nursing note reviewed.  Constitutional:      General: She is in acute distress.     Appearance: She is well-developed. She is ill-appearing.  HENT:     Head: Normocephalic and atraumatic.     Mouth/Throat:     Mouth: Mucous membranes are dry.     Pharynx: Oropharynx is clear.  Eyes:     Extraocular Movements: Extraocular movements intact.     Pupils: Pupils are equal, round, and reactive to light.  Cardiovascular:     Rate and Rhythm: Regular rhythm. Tachycardia present.  Pulmonary:     Effort: Pulmonary effort is normal.     Breath sounds: Normal breath sounds.  Abdominal:     General: Bowel sounds are normal.     Palpations: Abdomen is soft.  Musculoskeletal:        General: Normal range of motion.     Cervical back: Normal range of motion and  neck supple.  Skin:    General: Skin is warm.     Capillary Refill: Capillary refill takes less than 2 seconds.  Neurological:     General: No focal deficit present.     Mental Status: She is alert and oriented to person, place, and time.  Psychiatric:        Mood and Affect: Mood normal.        Behavior: Behavior normal.     (all labs ordered are listed, but only abnormal results are displayed) Labs Reviewed  BASIC METABOLIC PANEL WITH GFR - Abnormal; Notable for the following components:      Result Value   Sodium 131 (*)    Potassium 3.4 (*)    CO2 <7 (*)    Glucose, Bld 305 (*)    Calcium 8.3 (*)    All other components within normal limits  BETA-HYDROXYBUTYRIC ACID - Abnormal; Notable for the following components:   Beta-Hydroxybutyric Acid >8.00 (*)    All other components within normal limits  CBC WITH  DIFFERENTIAL/PLATELET - Abnormal; Notable for the following components:   WBC 27.2 (*)    RBC 5.49 (*)    Hemoglobin 18.0 (*)    HCT 52.6 (*)    Platelets 506 (*)    Neutro Abs 23.0 (*)    Monocytes Absolute 1.9 (*)    Basophils Absolute 0.2 (*)    Abs Immature Granulocytes 0.20 (*)    All other components within normal limits  BLOOD GAS, VENOUS - Abnormal; Notable for the following components:   pH, Ven <6.95 (*)    pCO2, Ven 18 (*)    Bicarbonate 3.2 (*)    Acid-base deficit 29.8 (*)    All other components within normal limits  CBG MONITORING, ED - Abnormal; Notable for the following components:   Glucose-Capillary 288 (*)    All other components within normal limits  CBG MONITORING, ED - Abnormal; Notable for the following components:   Glucose-Capillary 284 (*)    All other components within normal limits  BASIC METABOLIC PANEL WITH GFR  BASIC METABOLIC PANEL WITH GFR  BASIC METABOLIC PANEL WITH GFR  BETA-HYDROXYBUTYRIC ACID  BETA-HYDROXYBUTYRIC ACID  BETA-HYDROXYBUTYRIC ACID  URINALYSIS, ROUTINE W REFLEX MICROSCOPIC  BASIC METABOLIC PANEL WITH GFR    EKG: EKG Interpretation Date/Time:  Monday March 11 2024 14:39:27 EDT Ventricular Rate:  126 PR Interval:  111 QRS Duration:  85 QT Interval:  331 QTC Calculation: 480 R Axis:   66  Text Interpretation: Sinus tachycardia Low voltage, precordial leads Borderline prolonged QT interval Since last tracing rate faster Confirmed by Dean Clarity [phone]) on 03/11/2024 3:12:39 PM  Radiology: [facility] Chest Portable 1 View Result Date: 03/11/2024 CLINICAL DATA:  Shortness of breath. EXAM: PORTABLE CHEST 1 VIEW COMPARISON:  03/27/2022 FINDINGS: The lungs are clear without focal pneumonia, edema, pneumothorax or pleural effusion. The cardiopericardial silhouette is within normal limits for size. No acute bony abnormality. Radiopaque foreign body superimposed on the left coracoid process is indeterminate and may be external to the  patient. Telemetry leads overlie the chest. IMPRESSION: No active disease. Electronically Signed   By: Camellia Candle M.D.   On: 03/11/2024 16:19     Procedures   Medications Ordered in the ED  insulin  regular, human (MYXREDLIN ) 100 units/ 100 mL infusion (9 Units/hr Intravenous New Bag/Given 03/11/24 1645)  lactated ringers  infusion ( Intravenous New Bag/Given 03/11/24 1624)  dextrose  5 % in lactated ringers  infusion (has no administration in time range)  dextrose  50 % solution 0-50 mL (has no administration in time range)  potassium chloride  10 mEq in 100 mL IVPB (10 mEq Intravenous New Bag/Given 03/11/24 1643)  lactated ringers  bolus 1,342 mL (0 mLs Intravenous Stopped 03/11/24 1624)  ondansetron  (ZOFRAN ) injection 4 mg (4 mg Intravenous Given 03/11/24 1508)                                    Medical Decision Making Amount and/or Complexity of Data Reviewed Labs: ordered. Radiology: ordered.  Risk Prescription drug management. Decision regarding hospitalization.   This patient presents to the ED for concern of n/v, this involves an extensive number of treatment options, and is a complaint that carries with it a high risk of complications and morbidity.  The differential diagnosis includes dka, hhs   Co morbidities that complicate the patient evaluation  Anxiety/depression/diabetes   Additional history obtained:  Additional history obtained from epic chart review External records from outside source obtained and reviewed including mom, EMS report   Lab Tests:  I Ordered, and personally interpreted labs.  The pertinent results include:  vbg with ph <6.95; cbc with wbc elevated at 27.2, hgb elevated at 18 (likely dehydration); bmp CO2 <7, glucose 305; BHB >8   Imaging Studies ordered:  I ordered imaging studies including cxr  I independently visualized and interpreted imaging which showed No active disease.  I agree with the radiologist interpretation   Cardiac  Monitoring:  The patient was maintained on a cardiac monitor.  I personally viewed and interpreted the cardiac monitored which showed an underlying rhythm of: st   Medicines ordered and prescription drug management:  I ordered medication including ivfs/iv insulin   for sx  Reevaluation of the patient after these medicines showed that the patient improved I have reviewed the patients home medicines and have made adjustments as needed   Critical Interventions:  Ivfs/iv insulin    Consultations Obtained:  I requested consultation with the hospitalist (Dr. Fredirick),  and discussed lab and imaging findings as well as pertinent plan - she will admit  Problem List / ED Course:  DKA:  IVFs and IV insulin  started.  DKA from not taking insulin  Hypokalemia:  iv k given   Reevaluation:  After the interventions noted above, I reevaluated the patient and found that they have :improved   Social Determinants of Health:  Lives at home   Dispostion:  After consideration of the diagnostic results and the patients response to treatment, I feel that the patent would benefit from admission.  CRITICAL CARE Performed by: Mliss Boyers   Total critical care time: 30 minutes  Critical care time was exclusive of separately billable procedures and treating other patients.  Critical care was necessary to treat or prevent imminent or life-threatening deterioration.  Critical care was time spent personally by me on the following activities: development of treatment plan with patient and/or surrogate as well as nursing, discussions with consultants, evaluation of patient's response to treatment, examination of patient, obtaining history from patient or surrogate, ordering and performing treatments and interventions, ordering and review of laboratory studies, ordering and review of radiographic studies, pulse oximetry and re-evaluation of patient's condition.        Final diagnoses:  Diabetic  ketoacidosis without coma associated with type 1 diabetes mellitus [facility])    ED Discharge Orders     None          Boyers Mliss,  MD 03/11/24 1741

## 2024-03-11 NOTE — H&P (Signed)
 History and Physical    Patient: Shelby Rubio FMW:[id number] DOB: 12-26-2000 DOA: 03/11/2024 DOS: the patient was seen and examined on 03/11/2024 PCP: Lucius Krabbe, NP  Patient coming from: Home  Chief Complaint:  Chief Complaint  Patient presents with   Shortness of Breath   HPI: Shelby Rubio is a 23 y.o. female with medical history significant of anxiety and depression and type 1 diabetes who has been out of her insulin  for the past week.  She reports increasing shortness of breath today as well as nausea and vomiting.  In the ED she was noted to be in DKA with a beta hydroxybutyrate greater than 8 a bicarb less than 7 and a venous pH of 6.95.  Her initial CBG was 305.  Patient was given IV fluid resuscitation and IV insulin  in the ED and we are asked to admit.  Review of Systems: As mentioned in the history of present illness. All other systems reviewed and are negative. Past Medical History:  Diagnosis Date   Abnormal menses 06/06/2018   Anxiety 2011?   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 She has a 4 pack-year smoking history. She has never used smokeless tobacco. She reports that she does not drink alcohol and does not use drugs.  Allergies  Allergen Reactions   Penicillins Hives   Amoxil [Amoxicillin] Hives    Family History   Problem Relation Age of Onset   Diabetes Mother    Hypertension Mother    Obesity Mother    Asthma Sister    Asthma Brother    Diabetes Other    ADD / ADHD Father    Alcohol abuse Father    Anxiety disorder Father    Arthritis Father    Depression Father    Stroke Father    Arthritis Maternal Grandfather    Asthma Maternal Grandfather    Cancer Maternal Grandfather    Diabetes Maternal Grandfather    Hearing loss Maternal Grandfather    Kidney disease Maternal Grandfather    Arthritis Maternal Grandmother    Miscarriages / Stillbirths Maternal Grandmother    ADD / ADHD Paternal Grandfather    Anxiety disorder Paternal Grandfather    Arthritis Paternal Grandfather    Depression Paternal Grandfather    Diabetes Paternal Grandfather    Early death Paternal Grandfather    Heart disease Paternal Grandfather    Anxiety disorder Paternal Grandmother    Depression Paternal Grandmother    Diabetes Paternal Grandmother    Obesity Paternal Grandmother    ADD / ADHD Brother    Anxiety disorder Brother    Asthma Brother    Depression Brother    ADD / ADHD Brother    ADD / ADHD Paternal Uncle    Anxiety  disorder Paternal Uncle    Arthritis Paternal Uncle    Asthma Paternal Uncle    Depression Paternal Uncle    Diabetes Paternal Uncle    Hypertension Paternal Uncle    Obesity Paternal Uncle    ADD / ADHD Paternal Uncle    Anxiety disorder Paternal Uncle    Arthritis Paternal Uncle    Asthma Paternal Uncle    Depression Paternal Uncle    Diabetes Paternal Uncle    Hypertension Paternal Uncle    Obesity Paternal Uncle    Anxiety disorder Sister    Asthma Sister    Depression Sister    Miscarriages / Stillbirths Sister    Anxiety disorder Paternal Uncle    Depression Paternal Uncle    Diabetes Paternal Uncle    Learning disabilities Paternal Uncle    Obesity Paternal Uncle    Diabetes Paternal Uncle    Kidney disease Paternal Uncle    Obesity Paternal Uncle     Miscarriages / Stillbirths Maternal Aunt    Obesity Maternal Aunt     Prior to Admission medications   Medication Sig Start Date End Date Taking? Authorizing Provider  cetirizine  (ZYRTEC ) 10 MG tablet Take 1 tablet (10 mg total) by mouth daily. 03/28/22   Pearlean Manus, MD  fluticasone  (FLONASE ) 50 MCG/ACT nasal spray Place 2 sprays into both nostrils daily. 03/28/22   Pearlean Manus, MD  glipiZIDE  (GLUCOTROL  XL) 5 MG 24 hr tablet Take 1 tablet (5 mg total) by mouth daily with breakfast. 09/19/23   Lucius Krabbe, NP  Insulin  Pen Needle (BD PEN NEEDLE NANO U/F) 32G X 4 MM MISC Inject 1 each into the skin daily. 09/19/23   Lucius Krabbe, NP  levonorgestrel  (MIRENA ) 20 MCG/DAY IUD 1 each by Intrauterine route once.    [provider]  lisinopril  (ZESTRIL ) 2.5 MG tablet Take 1 tablet (2.5 mg total) by mouth daily. 09/19/23   Lucius Krabbe, NP  metFORMIN  (GLUCOPHAGE -XR) 750 MG 24 hr tablet Take 1 tablet (750 mg total) by mouth 2 (two) times daily after a meal. 03/28/22   Emokpae, Courage, MD  SEMGLEE , YFGN, 100 UNIT/ML Pen Inject 20 Units into the skin daily. 09/19/23   Lucius Krabbe, NP  venlafaxine  XR (EFFEXOR  XR) 37.5 MG 24 hr capsule Take 1 capsule (37.5 mg total) by mouth daily with breakfast. After 2 weeks, increase dose to 2 pills daily if needed. 09/19/23   Lucius Krabbe, NP    Physical Exam: Vitals:   03/11/24 1446 03/11/24 1545 03/11/24 1645 03/11/24 1700  BP:  138/83 (!) 142/91 128/87  Pulse:  (!) 128 (!) 124 (!) 129  Resp:  (!) 34 (!) 31 (!) 36  Temp:      TempSrc:      SpO2:  100% 100% 100%  Weight: 67.1 kg     Height: 5' 4 (1.626 m)      Physical Examination: General appearance - in mild to moderate distress and ill-appearing Neck - supple, no significant adenopathy Chest -Kussmaul breathing, clear to auscultation Heart - normal rate, regular rhythm, normal S1, S2, no murmurs, rubs, clicks or gallops Abdomen - soft, nontender, nondistended, no  masses or organomegaly Extremities - peripheral pulses normal, no pedal edema, no clubbing or cyanosis  Data Reviewed: Results for orders placed or performed during the hospital encounter of 03/11/24 (from the past 24 hours)  CBG monitoring, ED     Status: Abnormal   Collection Time: 03/11/24  2:41 PM  Result Value Ref Range  Glucose-Capillary 288 (H) 70 - 99 mg/dL  Basic metabolic panel     Status: Abnormal   Collection Time: 03/11/24  2:55 PM  Result Value Ref Range   Sodium 131 (L) 135 - 145 mmol/L   Potassium 3.4 (L) 3.5 - 5.1 mmol/L   Chloride 104 98 - 111 mmol/L   CO2 <7 (L) 22 - 32 mmol/L   Glucose, Bld 305 (H) 70 - 99 mg/dL   BUN 8 6 - 20 mg/dL   Creatinine, Ser 9.23 0.44 - 1.00 mg/dL   Calcium 8.3 (L) 8.9 - 10.3 mg/dL   GFR, Estimated >39 >39 mL/min   Anion gap NOT CALCULATED 5 - 15  Beta-hydroxybutyric acid     Status: Abnormal   Collection Time: 03/11/24  2:55 PM  Result Value Ref Range   Beta-Hydroxybutyric Acid >8.00 (H) 0.05 - 0.27 mmol/L  CBC with Differential (PNL)     Status: Abnormal   Collection Time: 03/11/24  2:55 PM  Result Value Ref Range   WBC 27.2 (H) 4.0 - 10.5 K/uL   RBC 5.49 (H) 3.87 - 5.11 MIL/uL   Hemoglobin 18.0 (H) 12.0 - 15.0 g/dL   HCT 47.3 (H) 63.9 - 53.9 %   MCV 95.8 80.0 - 100.0 fL   MCH 32.8 26.0 - 34.0 pg   MCHC 34.2 30.0 - 36.0 g/dL   RDW 87.9 88.4 - 84.4 %   Platelets 506 (H) 150 - 400 K/uL   nRBC 0.0 0.0 - 0.2 %   Neutrophils Relative % 84 %   Neutro Abs 23.0 (H) 1.7 - 7.7 K/uL   Lymphocytes Relative 7 %   Lymphs Abs 2.0 0.7 - 4.0 K/uL   Monocytes Relative 7 %   Monocytes Absolute 1.9 (H) 0.1 - 1.0 K/uL   Eosinophils Relative 0 %   Eosinophils Absolute 0.1 0.0 - 0.5 K/uL   Basophils Relative 1 %   Basophils Absolute 0.2 (H) 0.0 - 0.1 K/uL   RBC Morphology MORPHOLOGY UNREMARKABLE    Smear Review Normal platelet morphology    Immature Granulocytes 1 %   Abs Immature Granulocytes 0.20 (H) 0.00 - 0.07 K/uL   Reactive, Benign  Lymphocytes PRESENT   Blood gas, venous     Status: Abnormal   Collection Time: 03/11/24  3:10 PM  Result Value Ref Range   pH, Ven <6.95 (LL) 7.25 - 7.43   pCO2, Ven 18 (LL) 44 - 60 mmHg   pO2, Ven 44 32 - 45 mmHg   Bicarbonate 3.2 (L) 20.0 - 28.0 mmol/L   Acid-base deficit 29.8 (H) 0.0 - 2.0 mmol/L   O2 Saturation 80.7 %   Patient temperature 36.2    Collection site RIGHT ANTECUBITAL    Drawn by [id number]   CBG monitoring, ED     Status: Abnormal   Collection Time: 03/11/24  4:36 PM  Result Value Ref Range   Glucose-Capillary 284 (H) 70 - 99 mg/dL  CBG monitoring, ED     Status: Abnormal   Collection Time: 03/11/24  5:41 PM  Result Value Ref Range   Glucose-Capillary 229 (H) 70 - 99 mg/dL  Basic metabolic panel     Status: Abnormal   Collection Time: 03/11/24  5:44 PM  Result Value Ref Range   Sodium 134 (L) 135 - 145 mmol/L   Potassium 3.0 (L) 3.5 - 5.1 mmol/L   Chloride 109 98 - 111 mmol/L   CO2 <7 (L) 22 - 32 mmol/L   Glucose, Bld 221 (H)  70 - 99 mg/dL   BUN 7 6 - 20 mg/dL   Creatinine, Ser 9.35 0.44 - 1.00 mg/dL   Calcium 7.8 (L) 8.9 - 10.3 mg/dL   GFR, Estimated >39 >39 mL/min   Anion gap NOT CALCULATED 5 - 15  CBG monitoring, ED     Status: Abnormal   Collection Time: 03/11/24  6:43 PM  Result Value Ref Range   Glucose-Capillary 180 (H) 70 - 99 mg/dL   DG Chest Portable 1 View Result Date: 03/11/2024 CLINICAL DATA:  Shortness of breath. EXAM: PORTABLE CHEST 1 VIEW COMPARISON:  03/27/2022 FINDINGS: The lungs are clear without focal pneumonia, edema, pneumothorax or pleural effusion. The cardiopericardial silhouette is within normal limits for size. No acute bony abnormality. Radiopaque foreign body superimposed on the left coracoid process is indeterminate and may be external to the patient. Telemetry leads overlie the chest. IMPRESSION: No active disease. Electronically Signed   By: Camellia Candle M.D.   On: 03/11/2024 16:19     Assessment and Plan: * DKA (diabetic  ketoacidosis) (HCC) Admit to ICU Significant IV fluid resuscitation IV insulin  drip Serial electrolytes and labs Potassium repletion No obvious signs of infection  Current smoker Consider nicotine  patch  Type 1 diabetes mellitus with hyperglycemia (HCC) Patient is on Lantus  at home but her co-pay is approximately $70 and she cannot afford this. Would consider insulin  pump given her T1DM  Hypertension associated with diabetes (HCC) Is normotensive currently with no medication. Consider ACE or ARB given her diagnoses.  Moderate episode of recurrent major depressive disorder (HCC) Presently on no meds.      Advance Care Planning:   Code Status: Prior full  Consults: None  Family Communication: Aunt at bedside  Severity of Illness: The appropriate patient status for this patient is OBSERVATION. Observation status is judged to be reasonable and necessary in order to provide the required intensity of service to ensure the patient's safety. The patient's presenting symptoms, physical exam findings, and initial radiographic and laboratory data in the context of their medical condition is felt to place them at decreased risk for further clinical deterioration. Furthermore, it is anticipated that the patient will be medically stable for discharge from the hospital within 2 midnights of admission.   Author: Glenys GORMAN Birk, MD 03/11/2024 5:39 PM  For on call review [email or web address].

## 2024-03-11 NOTE — Assessment & Plan Note (Addendum)
 Admit to ICU Significant IV fluid resuscitation IV insulin  drip Serial electrolytes and labs Potassium repletion No obvious signs of infection

## 2024-03-11 NOTE — ED Notes (Signed)
 Patient meets sepsis criteria. MD Haviland in the room states to not order sepsis protocol due to possible DKA.

## 2024-03-11 NOTE — Hospital Course (Signed)
 Patient is a 23 year old with history of anxiety and depression and type 1 diabetes who has been out of her insulin  for the past week.  She reports increasing shortness of breath today as well as nausea and vomiting.  In the ED she was noted to be in DKA with a beta hydroxybutyrate greater than 8 a bicarb less than 7 and a venous pH of 6.95.  Her initial CBG was 305.  Patient was given IV fluid resuscitation and IV insulin  in the ED and we are asked to admit.

## 2024-03-11 NOTE — ED Triage Notes (Signed)
 Patient came in via [facility] for overall not feeling well. Patient is type 1 diabetic. CBG is 288. Patient states that she is SOB, throat and ear hurting. Vomiting for the past 3 days.

## 2024-03-11 NOTE — Assessment & Plan Note (Signed)
 Patient is on Lantus  at home but her co-pay is approximately $70 and she cannot afford this. Would consider insulin  pump given her T1DM

## 2024-03-11 NOTE — ED Notes (Addendum)
 Insulin  drip delayed, waiting on lab result, call placed to lab who states labs are running a second time to verify abnormal lab results

## 2024-03-11 NOTE — Assessment & Plan Note (Signed)
 Is normotensive currently with no medication. Consider ACE or ARB given her diagnoses.

## 2024-03-12 ENCOUNTER — Other Ambulatory Visit (HOSPITAL_COMMUNITY): Payer: Self-pay

## 2024-03-12 ENCOUNTER — Telehealth (HOSPITAL_COMMUNITY): Payer: Self-pay | Admitting: Pharmacy Technician

## 2024-03-12 DIAGNOSIS — E1010 Type 1 diabetes mellitus with ketoacidosis without coma: Secondary | ICD-10-CM | POA: Diagnosis not present

## 2024-03-12 DIAGNOSIS — F17200 Nicotine dependence, unspecified, uncomplicated: Secondary | ICD-10-CM | POA: Diagnosis not present

## 2024-03-12 LAB — BASIC METABOLIC PANEL WITH GFR
Anion gap: 9 (ref 5–15)
Anion gap: 9 (ref 5–15)
Anion gap: 10 (ref 5–15)
Anion gap: 10 (ref 5–15)
Anion gap: 10 (ref 5–15)
BUN: 6 mg/dL (ref 6–20)
BUN: 5 mg/dL — ABNORMAL LOW (ref 6–20)
BUN: 5 mg/dL — ABNORMAL LOW (ref 6–20)
BUN: 5 mg/dL — ABNORMAL LOW (ref 6–20)
BUN: <5 mg/dL — ABNORMAL LOW (ref 6–20)
BUN: <5 mg/dL — ABNORMAL LOW (ref 6–20)
CO2: <7 mmol/L — ABNORMAL LOW (ref 22–32)
CO2: 8 mmol/L — ABNORMAL LOW (ref 22–32)
CO2: 10 mmol/L — ABNORMAL LOW (ref 22–32)
CO2: 10 mmol/L — ABNORMAL LOW (ref 22–32)
CO2: 12 mmol/L — ABNORMAL LOW (ref 22–32)
CO2: 12 mmol/L — ABNORMAL LOW (ref 22–32)
Calcium: 7.8 mg/dL — ABNORMAL LOW (ref 8.9–10.3)
Calcium: 8 mg/dL — ABNORMAL LOW (ref 8.9–10.3)
Calcium: 8.1 mg/dL — ABNORMAL LOW (ref 8.9–10.3)
Calcium: 7.9 mg/dL — ABNORMAL LOW (ref 8.9–10.3)
Calcium: 8 mg/dL — ABNORMAL LOW (ref 8.9–10.3)
Calcium: 7.9 mg/dL — ABNORMAL LOW (ref 8.9–10.3)
Chloride: 114 mmol/L — ABNORMAL HIGH (ref 98–111)
Chloride: 117 mmol/L — ABNORMAL HIGH (ref 98–111)
Chloride: 115 mmol/L — ABNORMAL HIGH (ref 98–111)
Chloride: 116 mmol/L — ABNORMAL HIGH (ref 98–111)
Chloride: 114 mmol/L — ABNORMAL HIGH (ref 98–111)
Chloride: 112 mmol/L — ABNORMAL HIGH (ref 98–111)
Creatinine, Ser: 0.55 mg/dL (ref 0.44–1.00)
Creatinine, Ser: 0.54 mg/dL (ref 0.44–1.00)
Creatinine, Ser: 0.47 mg/dL (ref 0.44–1.00)
Creatinine, Ser: 0.42 mg/dL — ABNORMAL LOW (ref 0.44–1.00)
Creatinine, Ser: 0.34 mg/dL — ABNORMAL LOW (ref 0.44–1.00)
Creatinine, Ser: 0.36 mg/dL — ABNORMAL LOW (ref 0.44–1.00)
GFR, Estimated: >60 mL/min (ref >60)
GFR, Estimated: >60 mL/min (ref >60)
GFR, Estimated: >60 mL/min (ref >60)
GFR, Estimated: >60 mL/min (ref >60)
GFR, Estimated: >60 mL/min (ref >60)
GFR, Estimated: >60 mL/min (ref >60)
Glucose, Bld: 180 mg/dL — ABNORMAL HIGH (ref 70–99)
Glucose, Bld: 190 mg/dL — ABNORMAL HIGH (ref 70–99)
Glucose, Bld: 192 mg/dL — ABNORMAL HIGH (ref 70–99)
Glucose, Bld: 185 mg/dL — ABNORMAL HIGH (ref 70–99)
Glucose, Bld: 179 mg/dL — ABNORMAL HIGH (ref 70–99)
Glucose, Bld: 176 mg/dL — ABNORMAL HIGH (ref 70–99)
Potassium: 2.8 mmol/L — ABNORMAL LOW (ref 3.5–5.1)
Potassium: 3 mmol/L — ABNORMAL LOW (ref 3.5–5.1)
Potassium: 2.5 mmol/L — CL (ref 3.5–5.1)
Potassium: 3.1 mmol/L — ABNORMAL LOW (ref 3.5–5.1)
Potassium: 2.9 mmol/L — ABNORMAL LOW (ref 3.5–5.1)
Potassium: 3.8 mmol/L (ref 3.5–5.1)
Sodium: 134 mmol/L — ABNORMAL LOW (ref 135–145)
Sodium: 134 mmol/L — ABNORMAL LOW (ref 135–145)
Sodium: 134 mmol/L — ABNORMAL LOW (ref 135–145)
Sodium: 136 mmol/L (ref 135–145)
Sodium: 136 mmol/L (ref 135–145)
Sodium: 134 mmol/L — ABNORMAL LOW (ref 135–145)

## 2024-03-12 LAB — GLUCOSE, CAPILLARY
Glucose-Capillary: 169 mg/dL — ABNORMAL HIGH (ref 70–99)
Glucose-Capillary: 170 mg/dL — ABNORMAL HIGH (ref 70–99)
Glucose-Capillary: 170 mg/dL — ABNORMAL HIGH (ref 70–99)
Glucose-Capillary: 170 mg/dL — ABNORMAL HIGH (ref 70–99)
Glucose-Capillary: 172 mg/dL — ABNORMAL HIGH (ref 70–99)
Glucose-Capillary: 173 mg/dL — ABNORMAL HIGH (ref 70–99)
Glucose-Capillary: 174 mg/dL — ABNORMAL HIGH (ref 70–99)
Glucose-Capillary: 175 mg/dL — ABNORMAL HIGH (ref 70–99)
Glucose-Capillary: 175 mg/dL — ABNORMAL HIGH (ref 70–99)
Glucose-Capillary: 175 mg/dL — ABNORMAL HIGH (ref 70–99)
Glucose-Capillary: 175 mg/dL — ABNORMAL HIGH (ref 70–99)
Glucose-Capillary: 177 mg/dL — ABNORMAL HIGH (ref 70–99)
Glucose-Capillary: 180 mg/dL — ABNORMAL HIGH (ref 70–99)
Glucose-Capillary: 181 mg/dL — ABNORMAL HIGH (ref 70–99)
Glucose-Capillary: 184 mg/dL — ABNORMAL HIGH (ref 70–99)
Glucose-Capillary: 187 mg/dL — ABNORMAL HIGH (ref 70–99)
Glucose-Capillary: 188 mg/dL — ABNORMAL HIGH (ref 70–99)
Glucose-Capillary: 188 mg/dL — ABNORMAL HIGH (ref 70–99)
Glucose-Capillary: 190 mg/dL — ABNORMAL HIGH (ref 70–99)
Glucose-Capillary: 190 mg/dL — ABNORMAL HIGH (ref 70–99)
Glucose-Capillary: 191 mg/dL — ABNORMAL HIGH (ref 70–99)
Glucose-Capillary: 195 mg/dL — ABNORMAL HIGH (ref 70–99)
Glucose-Capillary: 198 mg/dL — ABNORMAL HIGH (ref 70–99)
Glucose-Capillary: 201 mg/dL — ABNORMAL HIGH (ref 70–99)

## 2024-03-12 LAB — URINALYSIS, ROUTINE W REFLEX MICROSCOPIC
Bilirubin Urine: NEGATIVE
Glucose, UA: >=500 mg/dL — AB
Ketones, ur: 80 mg/dL — AB
Nitrite: NEGATIVE
Protein, ur: 100 mg/dL — AB
Specific Gravity, Urine: 1.016 (ref 1.005–1.030)
pH: 6 (ref 5.0–8.0)

## 2024-03-12 LAB — HEMOGLOBIN A1C
Hgb A1c MFr Bld: 15.1 % — ABNORMAL HIGH (ref 4.8–5.6)
Mean Plasma Glucose: 387 mg/dL

## 2024-03-12 LAB — BETA-HYDROXYBUTYRIC ACID
Beta-Hydroxybutyric Acid: 4 mmol/L — ABNORMAL HIGH (ref 0.05–0.27)
Beta-Hydroxybutyric Acid: 2.63 mmol/L — ABNORMAL HIGH (ref 0.05–0.27)
Beta-Hydroxybutyric Acid: 2.25 mmol/L — ABNORMAL HIGH (ref 0.05–0.27)
Beta-Hydroxybutyric Acid: 1.76 mmol/L — ABNORMAL HIGH (ref 0.05–0.27)
Beta-Hydroxybutyric Acid: 2.63 mmol/L — ABNORMAL HIGH (ref 0.05–0.27)

## 2024-03-12 LAB — MAGNESIUM: Magnesium: 1.6 mg/dL — ABNORMAL LOW (ref 1.7–2.4)

## 2024-03-12 LAB — HIV ANTIBODY (ROUTINE TESTING W REFLEX): HIV Screen 4th Generation wRfx: NONREACTIVE

## 2024-03-12 MED ORDER — POTASSIUM CHLORIDE 20 MEQ PO PACK
40.0000 meq | PACK | Freq: Once | ORAL | Status: AC
  Administered 2024-03-12 (×2): 40 meq via ORAL
  Filled 2024-03-12: qty 2

## 2024-03-12 MED ORDER — POTASSIUM CHLORIDE 20 MEQ PO PACK
40.0000 meq | PACK | ORAL | Status: AC
  Administered 2024-03-12 – 2024-03-13 (×4): 40 meq via ORAL
  Filled 2024-03-12 (×2): qty 2

## 2024-03-12 MED ORDER — POTASSIUM CHLORIDE 10 MEQ/100ML IV SOLN
10.0000 meq | INTRAVENOUS | Status: AC
  Administered 2024-03-12 (×8): 10 meq via INTRAVENOUS
  Filled 2024-03-12 (×4): qty 100

## 2024-03-12 MED ORDER — POTASSIUM CHLORIDE 20 MEQ PO PACK
40.0000 meq | PACK | Freq: Two times a day (BID) | ORAL | Status: DC
  Administered 2024-03-12 (×2): 40 meq via ORAL
  Filled 2024-03-12: qty 2

## 2024-03-12 MED ORDER — MAGNESIUM SULFATE 2 GM/50ML IV SOLN
2.0000 g | Freq: Once | INTRAVENOUS | Status: AC
  Administered 2024-03-12 (×2): 2 g via INTRAVENOUS
  Filled 2024-03-12: qty 50

## 2024-03-12 MED ORDER — POTASSIUM CHLORIDE 20 MEQ PO PACK: 20.0000 meq | PACK | Freq: Once | ORAL | Status: DC

## 2024-03-12 NOTE — Progress Notes (Addendum)
 PROGRESS NOTE  Shelby Rubio FMW:[id number] DOB: 17-Jul-2001 DOA: 03/11/2024 PCP: Lucius Krabbe, NP  Brief History:  23 year old female with a history of insulin -dependent diabetes mellitus presenting with nausea, vomiting, abdominal discomfort and shortness of breath x 2 days.  She states that she has not been able to get her insulin  filled secondary to financial difficulties.  She has not been able to see her endocrine provider since 10/2022 secondary to financial difficulties.  Patient was previously on insulin  and glipizide  and metformin  when she was initially diagnosed with diabetes mellitus about 3 to 4 years ago.  She states that she has not taken any of her long-acting insulin  for 1-1/2 weeks.  In the ED, the patient was afebrile and tachycardic in 130s.  She is hemodynamically stable.  She presented with sodium 131, potassium 3.4, bicarbonate <7.  Anion gap was >20.  Serum glucose was 306.  She was started on IV fluids and IV insulin .  VBG showed <6.95/18/44/3.  Chest ray was negative.   EKG showed sinus tachycardia with no concerning ST-T wave changes.   Assessment/Plan: DKA -patient started on IV insulin  with q 1 hour CBG check and q 4 hour BMPs -pt started on aggressive fluid resuscitation -Electrolytes were monitored and repleted -transitioned to [place] insulin  once anion gap closed -diet was advanced once anion gap closed - 09/19/2023 HbA1C--13.7 - 03/12/24 A1C--15.1 -check GAD antibodies -anti-insulin  antibodies -zinc transporter antibodies  Hypokalemia/Hypomagnesmia - Replete - Check magnesium --1.6  Leukemoid reaction - Likely secondary to stress demargination - Follow CBC - She is afebrile hemodynamically stable - UA neg pyuria - personally reviewed CXR--no infiltrates  Hyponatremia - Secondary to volume depletion and hyperglycemia  Tobacco abuse - Tobacco cessation discussed       Family Communication:   mother/father at bedside 8/12  Consultants:   none  Code Status:  FULL   DVT Prophylaxis:  [place] Lovenox    Procedures: As Listed in Progress Note Above  Antibiotics: None       Subjective: Patient denies fevers, chills, headache, chest pain, dyspnea, vomiting, diarrhea, abdominal pain, dysuria, hematuria, hematochezia, and melena.  She still has some nausea.   Objective: Vitals:   03/12/24 0800 03/12/24 0900 03/12/24 1000 03/12/24 1001  BP:  105/68 110/70   Pulse: 96 98 (!) 102   Resp: (!) 22 (!) 26  18  Temp:      TempSrc:      SpO2: 100% 99% 100%   Weight:      Height:        Intake/Output Summary (Last 24 hours) at 03/12/2024 1053 Last data filed at 03/12/2024 1015 Gross per 24 hour  Intake 2659.62 ml  Output 2000 ml  Net 659.62 ml   Weight change:  Exam:  General:  Pt is alert, follows commands appropriately, not in acute distress HEENT: No icterus, No thrush, No neck mass, [place]/AT Cardiovascular: RRR, S1/S2, no rubs, no gallops Respiratory: CTA bilaterally, no wheezing, no crackles, no rhonchi Abdomen: Soft/+BS, non tender, non distended, no guarding Extremities: No edema, No lymphangitis, No petechiae, No rashes, no synovitis   Data Reviewed: I have personally reviewed following labs and imaging studies Basic Metabolic Panel: Recent Labs  Lab 03/11/24 1455 03/11/24 1744 03/11/24 2320 03/12/24 0323 03/12/24 0810  NA 131* 134* 134* 134* 134*  K 3.4* 3.0* 2.8* 3.0* 2.5*  CL 104 109 114* 117* 115*  CO2 <7* <7* <7* 8* 10*  GLUCOSE 305* 221* 180*  190* 192*  BUN 8 7 6  5* 5*  CREATININE 0.76 0.64 0.55 0.54 0.47  CALCIUM 8.3* 7.8* 7.8* 8.0* 8.1*  MG  --   --  1.6*  --   --    Liver Function Tests: No results for input(s): AST, ALT, ALKPHOS, BILITOT, PROT, ALBUMIN in the last 168 hours. No results for input(s): LIPASE, AMYLASE in the last 168 hours. No results for input(s): AMMONIA in the last 168 hours. Coagulation Profile: No results for input(s): INR, PROTIME in the last  168 hours. CBC: Recent Labs  Lab 03/11/24 1455  WBC 27.2*  NEUTROABS 23.0*  HGB 18.0*  HCT 52.6*  MCV 95.8  PLT 506*   Cardiac Enzymes: No results for input(s): CKTOTAL, CKMB, CKMBINDEX, TROPONINI in the last 168 hours. BNP: Invalid input(s): POCBNP CBG: Recent Labs  Lab 03/12/24 0556 03/12/24 0658 03/12/24 0757 03/12/24 0902 03/12/24 1002  GLUCAP 201* 184* 181* 191* 175*   HbA1C: No results for input(s): HGBA1C in the last 72 hours. Urine analysis:    Component Value Date/Time   COLORURINE COLORLESS (A) 03/27/2022 0936   APPEARANCEUR CLEAR 03/27/2022 0936   LABSPEC 1.027 03/27/2022 0936   PHURINE 6.0 03/27/2022 0936   GLUCOSEU >=500 (A) 03/27/2022 0936   HGBUR SMALL (A) 03/27/2022 0936   BILIRUBINUR NEGATIVE 03/27/2022 0936   BILIRUBINUR negative 08/06/2021 1631   KETONESUR 5 (A) 03/27/2022 0936   PROTEINUR NEGATIVE 03/27/2022 0936   UROBILINOGEN 0.2 08/06/2021 1631   NITRITE NEGATIVE 03/27/2022 0936   LEUKOCYTESUR NEGATIVE 03/27/2022 0936   Sepsis Labs: @LABRCNTIP (procalcitonin:4,lacticidven:4) ) Recent Results (from the past 240 hours)  MRSA Next Gen by PCR, Nasal     Status: None   Collection Time: 03/11/24  7:01 PM   Specimen: Nasal Mucosa; Nasal Swab  Result Value Ref Range Status   MRSA by PCR Next Gen NOT DETECTED NOT DETECTED Final    Comment: (NOTE) The GeneXpert MRSA Assay (FDA approved for NASAL specimens only), is one component of a comprehensive MRSA colonization surveillance program. It is not intended to diagnose MRSA infection nor to guide or monitor treatment for MRSA infections. Test performance is not FDA approved in patients less than 57 years old. Performed at [facility], [address] [place], [place] [address]      Scheduled Meds:  Chlorhexidine  Gluconate Cloth  6 each Topical Q0600   enoxaparin  (LOVENOX ) injection  40 mg Subcutaneous Q24H   potassium chloride   40 mEq Oral BID   Continuous Infusions:   dextrose  5% lactated ringers  125 mL/hr at 03/12/24 1039   insulin  3.2 Units/hr (03/12/24 1038)   lactated ringers  Stopped (03/11/24 1746)   potassium chloride  10 mEq (03/12/24 1036)    Procedures/Studies: DG Chest Portable 1 View Result Date: 03/11/2024 CLINICAL DATA:  Shortness of breath. EXAM: PORTABLE CHEST 1 VIEW COMPARISON:  03/27/2022 FINDINGS: The lungs are clear without focal pneumonia, edema, pneumothorax or pleural effusion. The cardiopericardial silhouette is within normal limits for size. No acute bony abnormality. Radiopaque foreign body superimposed on the left coracoid process is indeterminate and may be external to the patient. Telemetry leads overlie the chest. IMPRESSION: No active disease. Electronically Signed   By: Camellia Candle M.D.   On: 03/11/2024 16:19    Alm Schneider, DO  [facility]  If 7PM-7AM, please contact night-coverage [email or web address] Password [facility] 03/12/2024, 10:53 AM   LOS: 1 day

## 2024-03-12 NOTE — Progress Notes (Signed)
 Potassium level noted to now be 2.9. Patient alert and oriented, no nausea or vomiting all day. Dr Tat made aware. PO and IV potassium ordered.

## 2024-03-12 NOTE — Plan of Care (Signed)
  Problem: Education: Goal: Knowledge of General Education information will improve Description: Including pain rating scale, medication(s)/side effects and non-pharmacologic comfort measures Outcome: Progressing   Problem: Health Behavior/Discharge Planning: Goal: Ability to manage health-related needs will improve Outcome: Progressing   Problem: Clinical Measurements: Goal: Ability to maintain clinical measurements within normal limits will improve Outcome: Progressing Goal: Will remain free from infection Outcome: Progressing Goal: Diagnostic test results will improve Outcome: Progressing Goal: Respiratory complications will improve Outcome: Progressing Goal: Cardiovascular complication will be avoided Outcome: Progressing   Problem: Activity: Goal: Risk for activity intolerance will decrease Outcome: Progressing   Problem: Nutrition: Goal: Adequate nutrition will be maintained Outcome: Progressing   Problem: Elimination: Goal: Will not experience complications related to bowel motility Outcome: Progressing Goal: Will not experience complications related to urinary retention Outcome: Progressing   Problem: Coping: Goal: Level of anxiety will decrease Outcome: Progressing   Problem: Safety: Goal: Ability to remain free from injury will improve Outcome: Progressing   Problem: Pain Managment: Goal: General experience of comfort will improve and/or be controlled Outcome: Progressing   Problem: Education: Goal: Ability to describe self-care measures that may prevent or decrease complications (Diabetes Survival Skills Education) will improve Outcome: Progressing Goal: Individualized Educational Video(s) Outcome: Progressing   Problem: Fluid Volume: Goal: Ability to maintain a balanced intake and output will improve Outcome: Progressing

## 2024-03-12 NOTE — Progress Notes (Signed)
   03/12/24 1041  TOC Brief Assessment  Insurance and Status Reviewed  Patient has primary care physician Yes  Home environment has been reviewed Home with Children  Prior level of function: Independent  Prior/Current Home Services No current home services  Social Drivers of Health Review SDOH reviewed no interventions necessary  Readmission risk has been reviewed Yes  Transition of care needs no transition of care needs at this time   Transition of Care Department [facility]) has reviewed patient and no TOC needs have been identified at this time. We will continue to monitor patient advancement through interdisciplinary progression rounds. If new patient transition needs arise, please place a TOC consult.

## 2024-03-12 NOTE — Hospital Course (Addendum)
 23 year old female with a history of insulin -dependent diabetes mellitus presenting with nausea, vomiting, abdominal discomfort and shortness of breath x 2 days.  She states that she has not been able to get her insulin  filled secondary to financial difficulties.  She has not been able to see her endocrine provider since 10/2022 secondary to financial difficulties.  Patient was previously on insulin  and glipizide  and metformin  when she was initially diagnosed with diabetes mellitus about 3 to 4 years ago.  She states that she has not taken any of her long-acting insulin  for 1-1/2 weeks.  In the ED, the patient was afebrile and tachycardic in 130s.  She is hemodynamically stable.  She presented with sodium 131, potassium 3.4, bicarbonate <7.  Anion gap was >20.  Serum glucose was 306.  She was started on IV fluids and IV insulin .  VBG showed <6.95/18/44/3.  Chest ray was negative.   EKG showed sinus tachycardia with no concerning ST-T wave changes.

## 2024-03-12 NOTE — Progress Notes (Signed)
 Date and time results received: 03/12/24 0840 (use smartphrase .now to insert current time)  Test: Potassium   Critical Value: 2.5  Name of Provider Notified: Dr Tat  Orders Received? Or Actions Taken?: Awaiting new orders.

## 2024-03-12 NOTE — Telephone Encounter (Signed)
 Patient Product/process development scientist completed.    The patient is insured through [facility] Patient has [employer], may use a copay card, and/or apply for patient assistance if available.    Ran test claim for Dexcom G7 Sensor and Requires Prior Authorization   This test claim was processed through [employer]- copay amounts may vary at other pharmacies due to [employer], or as the patient moves through the different stages of their insurance plan.     Reyes Sharps, CPHT Pharmacy Technician III Certified Patient Advocate [facility] Patient Advocate Team Direct Number: [phone]  Fax: [phone]

## 2024-03-12 NOTE — Inpatient Diabetes Management (Addendum)
 Inpatient Diabetes Program Recommendations  AACE/ADA: New Consensus Statement on Inpatient Glycemic Control   Target Ranges:  Prepandial:   less than 140 mg/dL      Peak postprandial:   less than 180 mg/dL (1-2 hours)      Critically ill patients:  140 - 180 mg/dL    Latest Reference Range & Units 03/12/24 03:48 03/12/24 04:51 03/12/24 05:56 03/12/24 06:58 03/12/24 07:57 03/12/24 09:02  Glucose-Capillary 70 - 99 mg/dL 801 (H) 811 (H) 798 (H) 184 (H) 181 (H) 191 (H)    Latest Reference Range & Units 03/11/24 14:55 03/11/24 17:44 03/11/24 23:20 03/12/24 03:23 03/12/24 08:10  CO2 22 - 32 mmol/L <7 (L) <7 (L) <7 (L) 8 (L) 10 (L)  Glucose 70 - 99 mg/dL 694 (H) 778 (H) 819 (H) 190 (H) 192 (H)  Anion gap 5 - 15  NOT CALCULATED NOT CALCULATED NOT CALCULATED 9 9    Latest Reference Range & Units 03/11/24 14:55 03/11/24 18:23 03/11/24 23:20 03/12/24 03:23 03/12/24 08:10  Beta-Hydroxybutyric Acid 0.05 - 0.27 mmol/L >8.00 (H) 7.96 (H) 4.00 (H) 2.63 (H) 2.25 (H)    Latest Reference Range & Units 09/19/23 11:27  C-Peptide 0.80 - 3.85 ng/mL 0.68 (L)   Review of Glycemic Control  Diabetes history: DM2 Outpatient Diabetes medications: Lantus  20 units QHS, Metformin  XR 750 mg BID, Glipizide  XL 5 mg QAM Current orders for Inpatient glycemic control: IV insulin   Inpatient Diabetes Program Recommendations:    Insulin : Once acidosis has cleared completely and provider is ready to transition from IV to SQ insulin , please consider ordering Semglee  20 units daily, CBGs Q4H, and Novolog  0-9 units Q4H.  Labs: Please consider ordering GAD antibodies, insulin  autoantibodies, and zinc transporter 8 (ZnT8) antibodies to help determine if patient has Type 1 DM versus Type 2 insulin  dependent DM.   Outpatient DM: Recommend sending prescriptions to [facility] in [place] ([address], [address], [place], [place]; [phone]) for 30 day supply; prescribing Lantus  SoloStar [phone]) 20 units at  bedtime, insulin  pen needles [phone]), and if short acting insulin  is prescribed Humalog  Kwikpens [phone]).  NOTE: Patient admitted with DKA and hypertension.  Initial glucose 305 mg/dl on 1/88/74 and was started on IV insulin  for DKA.  In reviewing the chart, noted patient seen Dr. Lenis (Endocrinologist) on 11/03/23. Per note, patient has Type 2 DM and patient had been off insulin  and only taking Metformin  and Glipizide ; A1C increased to 9.4% from 7.8% so patient was advised to start [place]  20 units at bedtime, continue Metformin  750 mg BID and Glipizide  XL 5 mg daily.  No follow up with Dr. Nida since then.  Per office note on 09/19/23 by CANDIE Comment, NP, She reports she has not seen her Endocrinology provider since last April as her copays were too expensive. She ran out of insulin  towards the end of the year and glipizide  a month ago and has only been taking the Metformin .   Per [facility] pharmacy check, Lantus  is covered by insurance and it was filled at [facility] for a 90 day supply but not sure what the copay is, Humalog  copay is $25.00 for 30 day supply and $75.00 for 90 day supply.    [facility] pharmacy and was informed that patient has a prescription for Lantus  pens ready for pick up (it was filled on 03/05/24; prior refill was on 12/29/23) and copay is $75 for 90 day supply, informed that Glipizide  ER 5 mg daily has not been filled  since March 2024 and Metformin  XR 750 mg/dl has not been filled since July 2024.  Tried to call patient on room phone (no answer) and on her cell phone (no answer). Sent chat message to RN to see if patient has phone in the room that I can use to call and talk with her.  Addendum 03/12/24@10 :35-Spoke with patient over the phone. Patient reports that she was taking Semglee  20 units at bedtime and she was getting it filled at [facility]. Discussed that [facility] pharmacy reported that she has prescription for Lantus  (not Semglee ) and patient stated that it must be Lantus  then  because she gets it from there. Patient confirms that her copay for the Lantus  was $75 and she could not afford that.  Patient states that she has not taken any oral DM medications in several months; she recently has been using on the Lantus  20 units at bedtime and her glucose has been in the 200's most of the time. Patient reports that she has used the [employer] 3 CGM but it malfunctioned a lot so she just does finger sticks now.  Patient reports that she does not plan to follow up with Dr. Lenis and she plans to continue to see PCP for DM management. Discussed that c-peptide was 0.68 on 09/19/23 indicating that her pancreas was not making much insulin  at all. Discussed that it was recommended that other labs be ordered to help determine type of DM patient has (insulin  dependent Type 2 verus Type 1).  Explained that since she is not make adequate insulin , she will need to take insulin  consistently to prevent reoccurrence of DKA. Discussed importance of DM control to decrease risk of complications from uncontrolled DM as well especially given she is 23 years old. Explained that she may need to take short acting insulin  in addition to long acting insulin  to get DM under better control.  Informed patient I would check to see if patient could get a 30 day supply of the Lantus  to decrease copay to $25. Discussed that if she is prescribed short acting insulin , Humalog , it would be $25 for 30 day supply as well. Patient reports that the $25 per month would work better for her than $75 for 90 day supply.  Asked patient to call and make a follow up appointment with PCP regarding DM management.  Patient verbalized understanding of information discussed and has no questions at this time.  Called [facility] pharmacy back to ask if the Lantus  could be filled in a 30 day supply so the copay would be $25 and I was informed that [facility] will not break the box of Lantus  pens to allow a 30 day supply; patient can only fill there  as a 90 day supply.     Thanks, Earnie Gainer, RN, MSN, CDCES Diabetes Coordinator Inpatient Diabetes Program [phone] (Team Pager from 8am to 5pm)

## 2024-03-13 DIAGNOSIS — F17200 Nicotine dependence, unspecified, uncomplicated: Secondary | ICD-10-CM | POA: Diagnosis not present

## 2024-03-13 DIAGNOSIS — E1010 Type 1 diabetes mellitus with ketoacidosis without coma: Secondary | ICD-10-CM | POA: Diagnosis not present

## 2024-03-13 LAB — BASIC METABOLIC PANEL WITH GFR
Anion gap: 6 (ref 5–15)
Anion gap: 7 (ref 5–15)
Anion gap: 7 (ref 5–15)
Anion gap: 8 (ref 5–15)
Anion gap: 9 (ref 5–15)
Anion gap: 9 (ref 5–15)
Anion gap: 9 (ref 5–15)
BUN: <5 mg/dL — ABNORMAL LOW (ref 6–20)
BUN: <5 mg/dL — ABNORMAL LOW (ref 6–20)
BUN: <5 mg/dL — ABNORMAL LOW (ref 6–20)
BUN: <5 mg/dL — ABNORMAL LOW (ref 6–20)
BUN: <5 mg/dL — ABNORMAL LOW (ref 6–20)
BUN: <5 mg/dL — ABNORMAL LOW (ref 6–20)
BUN: <5 mg/dL — ABNORMAL LOW (ref 6–20)
CO2: 14 mmol/L — ABNORMAL LOW (ref 22–32)
CO2: 15 mmol/L — ABNORMAL LOW (ref 22–32)
CO2: 15 mmol/L — ABNORMAL LOW (ref 22–32)
CO2: 16 mmol/L — ABNORMAL LOW (ref 22–32)
CO2: 17 mmol/L — ABNORMAL LOW (ref 22–32)
CO2: 19 mmol/L — ABNORMAL LOW (ref 22–32)
CO2: 19 mmol/L — ABNORMAL LOW (ref 22–32)
Calcium: 7.6 mg/dL — ABNORMAL LOW (ref 8.9–10.3)
Calcium: 7.7 mg/dL — ABNORMAL LOW (ref 8.9–10.3)
Calcium: 7.7 mg/dL — ABNORMAL LOW (ref 8.9–10.3)
Calcium: 7.8 mg/dL — ABNORMAL LOW (ref 8.9–10.3)
Calcium: 7.9 mg/dL — ABNORMAL LOW (ref 8.9–10.3)
Calcium: 7.9 mg/dL — ABNORMAL LOW (ref 8.9–10.3)
Calcium: 8 mg/dL — ABNORMAL LOW (ref 8.9–10.3)
Chloride: 108 mmol/L (ref 98–111)
Chloride: 109 mmol/L (ref 98–111)
Chloride: 110 mmol/L (ref 98–111)
Chloride: 110 mmol/L (ref 98–111)
Chloride: 111 mmol/L (ref 98–111)
Chloride: 113 mmol/L — ABNORMAL HIGH (ref 98–111)
Chloride: 114 mmol/L — ABNORMAL HIGH (ref 98–111)
Creatinine, Ser: 0.31 mg/dL — ABNORMAL LOW (ref 0.44–1.00)
Creatinine, Ser: 0.33 mg/dL — ABNORMAL LOW (ref 0.44–1.00)
Creatinine, Ser: 0.35 mg/dL — ABNORMAL LOW (ref 0.44–1.00)
Creatinine, Ser: <0.3 mg/dL — ABNORMAL LOW (ref 0.44–1.00)
Creatinine, Ser: <0.3 mg/dL — ABNORMAL LOW (ref 0.44–1.00)
Creatinine, Ser: <0.3 mg/dL — ABNORMAL LOW (ref 0.44–1.00)
Creatinine, Ser: <0.3 mg/dL — ABNORMAL LOW (ref 0.44–1.00)
GFR, Estimated: >60 mL/min (ref >60)
GFR, Estimated: >60 mL/min (ref >60)
GFR, Estimated: >60 mL/min (ref >60)
Glucose, Bld: 160 mg/dL — ABNORMAL HIGH (ref 70–99)
Glucose, Bld: 160 mg/dL — ABNORMAL HIGH (ref 70–99)
Glucose, Bld: 166 mg/dL — ABNORMAL HIGH (ref 70–99)
Glucose, Bld: 167 mg/dL — ABNORMAL HIGH (ref 70–99)
Glucose, Bld: 169 mg/dL — ABNORMAL HIGH (ref 70–99)
Glucose, Bld: 170 mg/dL — ABNORMAL HIGH (ref 70–99)
Glucose, Bld: 187 mg/dL — ABNORMAL HIGH (ref 70–99)
Potassium: 2.8 mmol/L — ABNORMAL LOW (ref 3.5–5.1)
Potassium: 2.9 mmol/L — ABNORMAL LOW (ref 3.5–5.1)
Potassium: 2.9 mmol/L — ABNORMAL LOW (ref 3.5–5.1)
Potassium: 2.9 mmol/L — ABNORMAL LOW (ref 3.5–5.1)
Potassium: 3 mmol/L — ABNORMAL LOW (ref 3.5–5.1)
Potassium: 3 mmol/L — ABNORMAL LOW (ref 3.5–5.1)
Potassium: 3.2 mmol/L — ABNORMAL LOW (ref 3.5–5.1)
Sodium: 133 mmol/L — ABNORMAL LOW (ref 135–145)
Sodium: 134 mmol/L — ABNORMAL LOW (ref 135–145)
Sodium: 134 mmol/L — ABNORMAL LOW (ref 135–145)
Sodium: 135 mmol/L (ref 135–145)
Sodium: 136 mmol/L (ref 135–145)
Sodium: 136 mmol/L (ref 135–145)
Sodium: 137 mmol/L (ref 135–145)

## 2024-03-13 LAB — CBC
HCT: 34.1 % — ABNORMAL LOW (ref 36.0–46.0)
Hemoglobin: 12.8 g/dL (ref 12.0–15.0)
MCH: 33.1 pg (ref 26.0–34.0)
MCHC: 37.5 g/dL — ABNORMAL HIGH (ref 30.0–36.0)
MCV: 88.1 fL (ref 80.0–100.0)
Platelets: 194 K/uL (ref 150–400)
RBC: 3.87 MIL/uL (ref 3.87–5.11)
RDW: 11.9 % (ref 11.5–15.5)
WBC: 7.6 K/uL (ref 4.0–10.5)
nRBC: 0 % (ref 0.0–0.2)

## 2024-03-13 LAB — GLUCOSE, CAPILLARY
Glucose-Capillary: 147 mg/dL — ABNORMAL HIGH (ref 70–99)
Glucose-Capillary: 160 mg/dL — ABNORMAL HIGH (ref 70–99)
Glucose-Capillary: 162 mg/dL — ABNORMAL HIGH (ref 70–99)
Glucose-Capillary: 165 mg/dL — ABNORMAL HIGH (ref 70–99)
Glucose-Capillary: 165 mg/dL — ABNORMAL HIGH (ref 70–99)
Glucose-Capillary: 166 mg/dL — ABNORMAL HIGH (ref 70–99)
Glucose-Capillary: 167 mg/dL — ABNORMAL HIGH (ref 70–99)
Glucose-Capillary: 167 mg/dL — ABNORMAL HIGH (ref 70–99)
Glucose-Capillary: 167 mg/dL — ABNORMAL HIGH (ref 70–99)
Glucose-Capillary: 169 mg/dL — ABNORMAL HIGH (ref 70–99)
Glucose-Capillary: 172 mg/dL — ABNORMAL HIGH (ref 70–99)
Glucose-Capillary: 172 mg/dL — ABNORMAL HIGH (ref 70–99)
Glucose-Capillary: 173 mg/dL — ABNORMAL HIGH (ref 70–99)
Glucose-Capillary: 174 mg/dL — ABNORMAL HIGH (ref 70–99)
Glucose-Capillary: 176 mg/dL — ABNORMAL HIGH (ref 70–99)
Glucose-Capillary: 176 mg/dL — ABNORMAL HIGH (ref 70–99)
Glucose-Capillary: 179 mg/dL — ABNORMAL HIGH (ref 70–99)
Glucose-Capillary: 181 mg/dL — ABNORMAL HIGH (ref 70–99)
Glucose-Capillary: 182 mg/dL — ABNORMAL HIGH (ref 70–99)
Glucose-Capillary: 186 mg/dL — ABNORMAL HIGH (ref 70–99)
Glucose-Capillary: 190 mg/dL — ABNORMAL HIGH (ref 70–99)
Glucose-Capillary: 190 mg/dL — ABNORMAL HIGH (ref 70–99)
Glucose-Capillary: 213 mg/dL — ABNORMAL HIGH (ref 70–99)

## 2024-03-13 LAB — BETA-HYDROXYBUTYRIC ACID
Beta-Hydroxybutyric Acid: 2.4 mmol/L — ABNORMAL HIGH (ref 0.05–0.27)
Beta-Hydroxybutyric Acid: 1.3 mmol/L — ABNORMAL HIGH (ref 0.05–0.27)
Beta-Hydroxybutyric Acid: 2.12 mmol/L — ABNORMAL HIGH (ref 0.05–0.27)
Beta-Hydroxybutyric Acid: 2.33 mmol/L — ABNORMAL HIGH (ref 0.05–0.27)
Beta-Hydroxybutyric Acid: 1.46 mmol/L — ABNORMAL HIGH (ref 0.05–0.27)
Beta-Hydroxybutyric Acid: 1.15 mmol/L — ABNORMAL HIGH (ref 0.05–0.27)
Beta-Hydroxybutyric Acid: 1.34 mmol/L — ABNORMAL HIGH (ref 0.05–0.27)

## 2024-03-13 MED ORDER — MAGNESIUM SULFATE 2 GM/50ML IV SOLN
2.0000 g | Freq: Once | INTRAVENOUS | Status: AC
  Administered 2024-03-13 (×2): 2 g via INTRAVENOUS
  Filled 2024-03-13: qty 50

## 2024-03-13 MED ORDER — POTASSIUM CHLORIDE CRYS ER 20 MEQ PO TBCR
40.0000 meq | EXTENDED_RELEASE_TABLET | Freq: Once | ORAL | Status: AC
  Administered 2024-03-13 (×2): 40 meq via ORAL
  Filled 2024-03-13: qty 2

## 2024-03-13 MED ORDER — POTASSIUM CHLORIDE CRYS ER 20 MEQ PO TBCR
40.0000 meq | EXTENDED_RELEASE_TABLET | ORAL | Status: AC
  Administered 2024-03-13 (×4): 40 meq via ORAL
  Filled 2024-03-13 (×2): qty 2

## 2024-03-13 MED ORDER — LACTATED RINGERS IV SOLN: INTRAVENOUS | Status: DC

## 2024-03-13 MED ORDER — DEXTROSE IN LACTATED RINGERS 5 % IV SOLN: INTRAVENOUS | Status: DC

## 2024-03-13 MED ORDER — POTASSIUM CHLORIDE 10 MEQ/100ML IV SOLN
10.0000 meq | INTRAVENOUS | Status: AC
  Administered 2024-03-13 (×8): 10 meq via INTRAVENOUS
  Filled 2024-03-13 (×4): qty 100

## 2024-03-13 MED ORDER — POTASSIUM CHLORIDE 10 MEQ/100ML IV SOLN
10.0000 meq | INTRAVENOUS | Status: AC
  Administered 2024-03-13 (×6): 10 meq via INTRAVENOUS
  Filled 2024-03-13 (×3): qty 100

## 2024-03-13 NOTE — Inpatient Diabetes Management (Addendum)
 Inpatient Diabetes Program Recommendations  AACE/ADA: New Consensus Statement on Inpatient Glycemic Control   Target Ranges:  Prepandial:   less than 140 mg/dL      Peak postprandial:   less than 180 mg/dL (1-2 hours)      Critically ill patients:  140 - 180 mg/dL    Latest Reference Range & Units 03/13/24 00:57 03/13/24 01:57 03/13/24 02:58 03/13/24 03:57 03/13/24 04:59 03/13/24 06:01 03/13/24 06:55  Glucose-Capillary 70 - 99 mg/dL 817 (H) 826 (H) 823 (H) 165 (H) 176 (H) 162 (H) 172 (H)    Latest Reference Range & Units 03/12/24 08:10 03/12/24 16:18 03/12/24 19:57 03/13/24 00:12 03/13/24 03:55 03/13/24 07:34  Beta-Hydroxybutyric Acid 0.05 - 0.27 mmol/L 2.25 (H) 1.76 (H) 2.63 (H) 2.40 (H) 1.30 (H) 2.12 (H)   Review of Glycemic Control  Diabetes history: DM2 Outpatient Diabetes medications: Lantus  20 units QHS, Metformin  XR 750 mg BID, Glipizide  XL 5 mg QAM Current orders for Inpatient glycemic control: IV insulin    Inpatient Diabetes Program Recommendations:     Insulin : Once acidosis has cleared completely and provider is ready to transition from IV to SQ insulin , please consider ordering Semglee  20 units daily, CBGs Q4H, Novolog  0-9 units Q4H, and Novolog  3 units TID with meals if patient eats at least 50% of meals.   Diet: Please consider ordering diet for patient to help provide glucose and clear acidosis.   Labs:  GAD antibodies, insulin  autoantibodies, and zinc transporter 8 (ZnT8) antibodies in process.    Outpatient DM: Recommend sending prescriptions to [facility] in [place] ([address], [place], [place]; [phone]) for 30 day supply; prescribing Lantus  SoloStar [phone]) 20 units at bedtime, insulin  pen needles [phone]), and if short acting insulin  is prescribed Humalog Kwikpens [phone]).   Thanks, Shelby Gainer, RN, MSN, CDCES Diabetes Coordinator Inpatient Diabetes Program [phone] (Team Pager from 8am to 5pm)

## 2024-03-13 NOTE — Progress Notes (Signed)
 PROGRESS NOTE  Shelby Rubio FMW:[id number] DOB: Dec 17, 2000 DOA: 03/11/2024 PCP: Lucius Krabbe, NP  Brief History:  Patient is a 23 year old with history of anxiety and depression and type 1 diabetes who has been out of her insulin  for the past week.  She reports increasing shortness of breath today as well as nausea and vomiting.  In the ED she was noted to be in DKA with a beta hydroxybutyrate greater than 8 a bicarb less than 7 and a venous pH of 6.95.  Her initial CBG was 305.  Patient was given IV fluid resuscitation and IV insulin  in the ED and we are asked to admit.   Assessment/Plan: DKA -patient started on IV insulin  with q 1 hour CBG check and q 4 hour BMPs - Continue aggressive fluid resuscitation - 09/19/2023 HbA1C--13.7 - 03/12/24 A1C--15.1 - Looking for patient to be transition to sliding scale insulin  and long-acting insulin  subcutaneously once DKA process resolved. - At the moment bicarb still not at goal.  Hypokalemia/Hypomagnesmia - In the setting of dehydration, decreased oral intake and ongoing therapy for hyperglycemia with insulin  drip. - Continue electrolytes repletion and follow trend.  Leukemoid reaction - Likely secondary to stress demargination - No acute signs of infection appreciated - Continue to follow WBCs trend - Maintain adequate hydration.  Hyponatremia - In the setting of dehydration and hyperglycemia - Continue fluid resuscitation - For electrolytes trend - Sodium levels improvement after blood glucose better controlled.  Tobacco abuse - Cessation counseling provided.   Family Communication:   Grandfather at bedside.  Consultants:  none  Code Status:  FULL   DVT Prophylaxis:  [place] Lovenox    Procedures: As Listed in Progress Note Above  Antibiotics: None  Subjective: Patient denies any fevers, headaches, chest pain, abdominal pain or nausea or vomiting currently.  Still requiring insulin  drip.   Objective: Vitals:    03/13/24 1400 03/13/24 1500 03/13/24 1600 03/13/24 1628  BP: (!) 128/97 122/84 114/70   Pulse: (!) 117 (!) 106 (!) 108   Resp:      Temp:    98.7 F (37.1 C)  TempSrc:    Oral  SpO2: 100% 100% 100%   Weight:      Height:        Intake/Output Summary (Last 24 hours) at 03/13/2024 1744 Last data filed at 03/13/2024 1700 Gross per 24 hour  Intake 3704.38 ml  Output 1150 ml  Net 2554.38 ml   Weight change:  Exam: General exam: Alert, awake, oriented x 3; in no acute distress.  Reports no nausea or vomiting.  Afebrile. Respiratory system: Good air movement bilaterally; good saturation on room air. Cardiovascular system: Sinus tachycardia, no rubs, no gallops, no JVD. Gastrointestinal system: Abdomen is nondistended, soft and nontender. No organomegaly or masses felt. Normal bowel sounds heard. Central nervous system:  No focal neurological deficits. Extremities: No C/C/E, +pedal pulses Skin: No rashes, lesions or ulcers Psychiatry: Judgement and insight appear normal. Mood & affect appropriate.   Data Reviewed: I have personally reviewed following labs and imaging studies  Basic Metabolic Panel: Recent Labs  Lab 03/11/24 2320 03/12/24 0323 03/13/24 0012 03/13/24 0355 03/13/24 0734 03/13/24 1156 03/13/24 1534  NA 134*   < > 137 134* 135 133* 136  K 2.8*   < > 2.8* 2.9* 3.0* 3.0* 2.9*  CL 114*   < > 113* 114* 111 109 110  CO2 <7*   < > 15* 14* 15*  16* 17*  GLUCOSE 180*   < > 167* 160* 160* 187* 169*  BUN 6   < > <5* <5* <5* <5* <5*  CREATININE 0.55   < > 0.33* 0.31* <0.30* 0.35* <0.30*  CALCIUM 7.8*   < > 8.0* 7.7* 7.8* 7.9* 7.9*  MG 1.6*  --   --   --   --   --   --    < > = values in this interval not displayed.   CBC: Recent Labs  Lab 03/11/24 1455 03/13/24 0734  WBC 27.2* 7.6  NEUTROABS 23.0*  --   HGB 18.0* 12.8  HCT 52.6* 34.1*  MCV 95.8 88.1  PLT 506* 194   BNP: Invalid input(s): POCBNP  CBG: Recent Labs  Lab 03/13/24 1323 03/13/24 1429  03/13/24 1532 03/13/24 1627 03/13/24 1729  GLUCAP 213* 181* 167* 190* 167*   HbA1C: Recent Labs    03/11/24 1455  HGBA1C 15.1*   Urine analysis:    Component Value Date/Time   COLORURINE YELLOW 03/12/2024 1430   APPEARANCEUR CLEAR 03/12/2024 1430   LABSPEC 1.016 03/12/2024 1430   PHURINE 6.0 03/12/2024 1430   GLUCOSEU >=500 (A) 03/12/2024 1430   HGBUR MODERATE (A) 03/12/2024 1430   BILIRUBINUR NEGATIVE 03/12/2024 1430   BILIRUBINUR negative 08/06/2021 1631   KETONESUR 80 (A) 03/12/2024 1430   PROTEINUR 100 (A) 03/12/2024 1430   UROBILINOGEN 0.2 08/06/2021 1631   NITRITE NEGATIVE 03/12/2024 1430   LEUKOCYTESUR MODERATE (A) 03/12/2024 1430   Sepsis Labs: @LABRCNTIP (procalcitonin:4,lacticidven:4) ) Recent Results (from the past 240 hours)  MRSA Next Gen by PCR, Nasal     Status: None   Collection Time: 03/11/24  7:01 PM   Specimen: Nasal Mucosa; Nasal Swab  Result Value Ref Range Status   MRSA by PCR Next Gen NOT DETECTED NOT DETECTED Final    Comment: (NOTE) The GeneXpert MRSA Assay (FDA approved for NASAL specimens only), is one component of a comprehensive MRSA colonization surveillance program. It is not intended to diagnose MRSA infection nor to guide or monitor treatment for MRSA infections. Test performance is not FDA approved in patients less than 53 years old. Performed at [facility], [address] [place], [place] [address]      Scheduled Meds:  Chlorhexidine  Gluconate Cloth  6 each Topical Q0600   enoxaparin  (LOVENOX ) injection  40 mg Subcutaneous Q24H   potassium chloride   40 mEq Oral Once   Continuous Infusions:  dextrose  5% lactated ringers      insulin  3.8 Units/hr (03/13/24 1715)   lactated ringers       Procedures/Studies: DG Chest Portable 1 View Result Date: 03/11/2024 CLINICAL DATA:  Shortness of breath. EXAM: PORTABLE CHEST 1 VIEW COMPARISON:  03/27/2022 FINDINGS: The lungs are clear without focal pneumonia, edema, pneumothorax or  pleural effusion. The cardiopericardial silhouette is within normal limits for size. No acute bony abnormality. Radiopaque foreign body superimposed on the left coracoid process is indeterminate and may be external to the patient. Telemetry leads overlie the chest. IMPRESSION: No active disease. Electronically Signed   By: Camellia Candle M.D.   On: 03/11/2024 16:19    Eric Nunnery, MD  [facility]  If 7PM-7AM, please contact night-coverage [email or web address] Password [facility] 03/13/2024, 5:44 PM   LOS: 2 days

## 2024-03-13 NOTE — Plan of Care (Signed)
  Problem: Education: Goal: Knowledge of General Education information will improve Description: Including pain rating scale, medication(s)/side effects and non-pharmacologic comfort measures Outcome: Progressing   Problem: Health Behavior/Discharge Planning: Goal: Ability to manage health-related needs will improve Outcome: Progressing   Problem: Clinical Measurements: Goal: Ability to maintain clinical measurements within normal limits will improve Outcome: Progressing Goal: Will remain free from infection Outcome: Progressing Goal: Diagnostic test results will improve Outcome: Progressing Goal: Respiratory complications will improve Outcome: Progressing Goal: Cardiovascular complication will be avoided Outcome: Progressing   Problem: Activity: Goal: Risk for activity intolerance will decrease Outcome: Progressing   Problem: Nutrition: Goal: Adequate nutrition will be maintained Outcome: Not Progressing   Problem: Coping: Goal: Level of anxiety will decrease Outcome: Progressing   Problem: Elimination: Goal: Will not experience complications related to bowel motility Outcome: Progressing Goal: Will not experience complications related to urinary retention Outcome: Progressing   Problem: Pain Managment: Goal: General experience of comfort will improve and/or be controlled Outcome: Progressing   Problem: Safety: Goal: Ability to remain free from injury will improve Outcome: Progressing   Problem: Skin Integrity: Goal: Risk for impaired skin integrity will decrease Outcome: Progressing   Problem: Education: Goal: Ability to describe self-care measures that may prevent or decrease complications (Diabetes Survival Skills Education) will improve Outcome: Progressing Goal: Individualized Educational Video(s) Outcome: Progressing   Problem: Coping: Goal: Ability to adjust to condition or change in health will improve Outcome: Progressing   Problem: Fluid  Volume: Goal: Ability to maintain a balanced intake and output will improve Outcome: Progressing   Problem: Health Behavior/Discharge Planning: Goal: Ability to identify and utilize available resources and services will improve Outcome: Progressing Goal: Ability to manage health-related needs will improve Outcome: Progressing   Problem: Metabolic: Goal: Ability to maintain appropriate glucose levels will improve Outcome: Not Progressing

## 2024-03-14 ENCOUNTER — Other Ambulatory Visit (HOSPITAL_BASED_OUTPATIENT_CLINIC_OR_DEPARTMENT_OTHER): Payer: Self-pay

## 2024-03-14 DIAGNOSIS — F17200 Nicotine dependence, unspecified, uncomplicated: Secondary | ICD-10-CM | POA: Diagnosis not present

## 2024-03-14 DIAGNOSIS — E1010 Type 1 diabetes mellitus with ketoacidosis without coma: Secondary | ICD-10-CM | POA: Diagnosis not present

## 2024-03-14 LAB — BASIC METABOLIC PANEL WITH GFR
Anion gap: 6 (ref 5–15)
Anion gap: 4 — ABNORMAL LOW (ref 5–15)
BUN: <5 mg/dL — ABNORMAL LOW (ref 6–20)
BUN: <5 mg/dL — ABNORMAL LOW (ref 6–20)
CO2: 20 mmol/L — ABNORMAL LOW (ref 22–32)
CO2: 22 mmol/L (ref 22–32)
Calcium: 7.7 mg/dL — ABNORMAL LOW (ref 8.9–10.3)
Calcium: 7.8 mg/dL — ABNORMAL LOW (ref 8.9–10.3)
Chloride: 112 mmol/L — ABNORMAL HIGH (ref 98–111)
Chloride: 113 mmol/L — ABNORMAL HIGH (ref 98–111)
Creatinine, Ser: 0.3 mg/dL — ABNORMAL LOW (ref 0.44–1.00)
Creatinine, Ser: 0.3 mg/dL — ABNORMAL LOW (ref 0.44–1.00)
GFR, Estimated: >60 mL/min (ref >60)
GFR, Estimated: >60 mL/min (ref >60)
Glucose, Bld: 156 mg/dL — ABNORMAL HIGH (ref 70–99)
Glucose, Bld: 153 mg/dL — ABNORMAL HIGH (ref 70–99)
Potassium: 3.6 mmol/L (ref 3.5–5.1)
Potassium: 3.3 mmol/L — ABNORMAL LOW (ref 3.5–5.1)
Sodium: 138 mmol/L (ref 135–145)
Sodium: 139 mmol/L (ref 135–145)

## 2024-03-14 LAB — GLUCOSE, CAPILLARY
Glucose-Capillary: 150 mg/dL — ABNORMAL HIGH (ref 70–99)
Glucose-Capillary: 162 mg/dL — ABNORMAL HIGH (ref 70–99)
Glucose-Capillary: 166 mg/dL — ABNORMAL HIGH (ref 70–99)
Glucose-Capillary: 166 mg/dL — ABNORMAL HIGH (ref 70–99)
Glucose-Capillary: 167 mg/dL — ABNORMAL HIGH (ref 70–99)
Glucose-Capillary: 172 mg/dL — ABNORMAL HIGH (ref 70–99)
Glucose-Capillary: 188 mg/dL — ABNORMAL HIGH (ref 70–99)
Glucose-Capillary: 194 mg/dL — ABNORMAL HIGH (ref 70–99)
Glucose-Capillary: 201 mg/dL — ABNORMAL HIGH (ref 70–99)
Glucose-Capillary: 218 mg/dL — ABNORMAL HIGH (ref 70–99)
Glucose-Capillary: 270 mg/dL — ABNORMAL HIGH (ref 70–99)
Glucose-Capillary: 311 mg/dL — ABNORMAL HIGH (ref 70–99)

## 2024-03-14 LAB — GLUTAMIC ACID DECARBOXYLASE AUTO ABS: Glutamic Acid Decarb Ab: <5 U/mL (ref 0.0–5.0)

## 2024-03-14 LAB — MRSA NEXT GEN BY PCR, NASAL: MRSA by PCR Next Gen: NOT DETECTED

## 2024-03-14 LAB — BETA-HYDROXYBUTYRIC ACID: Beta-Hydroxybutyric Acid: 0.36 mmol/L — ABNORMAL HIGH (ref 0.05–0.27)

## 2024-03-14 MED ORDER — INSULIN GLARGINE SOLOSTAR 100 UNIT/ML ~~LOC~~ SOPN
20.0000 [IU] | PEN_INJECTOR | Freq: Every day | SUBCUTANEOUS | 5 refills | Status: DC
  Filled 2024-03-14: qty 6, 30d supply, fill #0

## 2024-03-14 MED ORDER — INSULIN PEN NEEDLE 32G X 4 MM MISC
1.0000 | Freq: Every day | 2 refills | Status: AC
  Filled 2024-03-14: qty 100, 25d supply, fill #0
  Filled 2024-04-03 (×2): qty 100, 25d supply, fill #1
  Filled 2024-08-08: qty 100, 25d supply, fill #2

## 2024-03-14 MED ORDER — INSULIN ASPART 100 UNIT/ML IJ SOLN
0.0000 [IU] | Freq: Three times a day (TID) | INTRAMUSCULAR | Status: DC
  Administered 2024-03-14: 2 [IU] via SUBCUTANEOUS
  Administered 2024-03-14: 3 [IU] via SUBCUTANEOUS
  Administered 2024-03-14: 5 [IU] via SUBCUTANEOUS
  Administered 2024-03-15 (×2): 3 [IU] via SUBCUTANEOUS

## 2024-03-14 MED ORDER — INSULIN ASPART 100 UNIT/ML IJ SOLN: 0.0000 [IU] | Freq: Every day | INTRAMUSCULAR | Status: DC

## 2024-03-14 MED ORDER — INSULIN GLARGINE-YFGN 100 UNIT/ML ~~LOC~~ SOLN
20.0000 [IU] | SUBCUTANEOUS | Status: DC
  Administered 2024-03-14: 20 [IU] via SUBCUTANEOUS
  Filled 2024-03-14 (×2): qty 0.2

## 2024-03-14 MED ORDER — PANTOPRAZOLE SODIUM 40 MG PO TBEC
40.0000 mg | DELAYED_RELEASE_TABLET | Freq: Every day | ORAL | Status: DC
  Administered 2024-03-14 – 2024-03-15 (×2): 40 mg via ORAL
  Filled 2024-03-14 (×2): qty 1

## 2024-03-14 MED ORDER — POTASSIUM CHLORIDE CRYS ER 20 MEQ PO TBCR
40.0000 meq | EXTENDED_RELEASE_TABLET | ORAL | Status: AC
  Administered 2024-03-14 (×2): 40 meq via ORAL
  Filled 2024-03-14 (×2): qty 2

## 2024-03-14 MED ORDER — INSULIN GLARGINE-YFGN 100 UNIT/ML ~~LOC~~ SOLN
15.0000 [IU] | Freq: Two times a day (BID) | SUBCUTANEOUS | Status: DC
  Administered 2024-03-14 – 2024-03-15 (×2): 15 [IU] via SUBCUTANEOUS
  Filled 2024-03-14 (×5): qty 0.15

## 2024-03-14 NOTE — Progress Notes (Signed)
 Patient transitioned from DKA to hyperglycemia protocol as per the Endotool suggestion, labs within the desired range, CBG been < 200 the entire shift, off the insulin  drip, Closing CBG 166, 2 units sliding scale given, patient is not nauseous or has any complaints, A/O*4, given some snacks until the breakfast tray, will continue to monitor and will endorse to the oncoming RN.

## 2024-03-14 NOTE — Inpatient Diabetes Management (Signed)
 Inpatient Diabetes Program Recommendations  AACE/ADA: New Consensus Statement on Inpatient Glycemic Control   Target Ranges:  Prepandial:   less than 140 mg/dL      Peak postprandial:   less than 180 mg/dL (1-2 hours)      Critically ill patients:  140 - 180 mg/dL    Latest Reference Range & Units 03/14/24 00:34 03/14/24 01:33 03/14/24 02:32 03/14/24 03:33 03/14/24 04:33 03/14/24 05:33 03/14/24 06:18 03/14/24 07:46  Glucose-Capillary 70 - 99 mg/dL 811 (H) 837 (H) 827 (H) 166 (H) 167 (H) 150 (H) 166 (H) 201 (H)    Latest Reference Range & Units 09/19/23 10:55 03/11/24 14:55  Hemoglobin A1C 4.8 - 5.6 % 13.7 ! 15.1 (H)    Latest Reference Range & Units 09/19/23 11:27  C-Peptide 0.80 - 3.85 ng/mL 0.68 (L)   Review of Glycemic Control  Diabetes history: DM2 Outpatient Diabetes medications: Lantus  20 units QHS, Metformin  XR 750 mg BID, Glipizide  XL 5 mg QAM Current orders for Inpatient glycemic control: Semglee  15 units BID, Novolog  0-9 units TID with meals, Novolog  0-5 units QHS   Inpatient Diabetes Program Recommendations:     Insulin : Patient received Semglee  20 units at 4:12 am today at transition off IV insulin . Per RN note at 6:38 am today patient was provided with snacks after transition off insulin  drip and CBG 201 mg/dl at 2:53 am (elevated due to snacks).   Would recommend to decrease Semglee  back down to 20 units at bedtime and order Novolog  4 units TID with meals if patient eats at least 50% of meals.    Labs:  GAD antibodies, insulin  autoantibodies, and zinc transporter 8 (ZnT8) antibodies in process (will help determine which type of diabetes patient has).    Outpatient DM: Recommend sending prescriptions to [facility] in [place] ([address], [address], [place], [place]; [phone] for 30 day supply so that copay would be $25 each); prescribing Lantus  SoloStar [phone]) 20 units at bedtime, insulin  pen needles [phone]), and if short acting insulin  is  prescribed Humalog  Kwikpens [phone]).    Thanks, Earnie Gainer, RN, MSN, CDCES Diabetes Coordinator Inpatient Diabetes Program [phone] (Team Pager from 8am to 5pm)

## 2024-03-14 NOTE — Progress Notes (Addendum)
 Pt mom at bedside with multiple questions regarding patients care. She is confused as to diabetes diagnosis of type 1 vs type 2. She expresses concern over long term damage to kidneys, heart, etc and is requesting the morning MD discuss concerns with her. Pt and mom admit to financial hardship in regards to obtaining insulin . Pt anticipates application for medicaid but is concerned until she is approved, how she will afford insulin . Pt states if I have to choose between feeding my 23 yr old or getting my insulin , I'm feeding my child.   MD notified with request to obtain order for SW; order obtained

## 2024-03-14 NOTE — Progress Notes (Signed)
 PROGRESS NOTE  Shelby Rubio FMW:[id number] DOB: 2001-07-26 DOA: 03/11/2024 PCP: Lucius Krabbe, NP  Brief History:  Patient is a 23 year old with history of anxiety and depression and type 1 diabetes who has been out of her insulin  for the past week.  She reports increasing shortness of breath today as well as nausea and vomiting.  In the ED she was noted to be in DKA with a beta hydroxybutyrate greater than 8 a bicarb less than 7 and a venous pH of 6.95.  Her initial CBG was 305.  Patient was given IV fluid resuscitation and IV insulin  in the ED and we are asked to admit.   Assessment/Plan: DKA - After insulin  drip and close monitoring of patient's CBGs/BMET patient has been successfully transitioned to a sliding scale insulin  and long-acting insulin . - Modified carbohydrate diet has been initiated - Will continue to maintain adequate hydration. - Previous 09/19/2023 HbA1C--13.7 - 03/12/24 A1C--15.1 - Follow CBG fluctuation and adjust insulin  therapy for anticipated discharge plan on a 1525.  Hypokalemia/Hypomagnesmia - In the setting of dehydration, decreased oral intake and ongoing therapy for hyperglycemia with insulin  drip. - Continue electrolytes repletion and follow trend.  Leukemoid reaction - Likely secondary to stress demargination - No acute signs of infection appreciated - Continue to follow WBCs trend - Maintain adequate hydration.  Hyponatremia - In the setting of dehydration and hyperglycemia - Continue fluid resuscitation - For electrolytes trend - Sodium levels improvement after blood glucose better controlled.  Tobacco abuse - Cessation counseling provided.   Family Communication:   Grandfather at bedside.  Consultants:  none  Code Status:  FULL   DVT Prophylaxis:  Jenks Lovenox    Procedures: As Listed in Progress Note Above  Antibiotics: None  Subjective: No chest pain, no nausea, no vomiting, no abdominal pain.  Of insulin  drip and so  far tolerating diet.  Objective: Vitals:   03/14/24 0750 03/14/24 0800 03/14/24 0900 03/14/24 1211  BP:  110/69 112/64 113/72  Pulse:  (!) 108 (!) 102 99  Resp:  (!) 22 18   Temp: 98.2 F (36.8 C)   98.3 F (36.8 C)  TempSrc: Oral   Oral  SpO2:  100% 100% 100%  Weight:      Height:        Intake/Output Summary (Last 24 hours) at 03/14/2024 1736 Last data filed at 03/14/2024 0710 Gross per 24 hour  Intake 1492.74 ml  Output --  Net 1492.74 ml   Weight change:  Exam: General exam: Alert, awake, oriented x 3; no nausea or vomiting.  Feeling better and tolerating diet. Respiratory system: Clear to auscultation. Respiratory effort normal.  Good saturation on room air. Cardiovascular system: Sinus tachycardia appreciated on exam; no rubs or gallops. Gastrointestinal system: Abdomen is nondistended, soft and nontender. No organomegaly or masses felt. Normal bowel sounds heard. Central nervous system: Alert and oriented. No focal neurological deficits. Extremities: No C/C/E, +pedal pulses Skin: No rashes, lesions or ulcers Psychiatry: Judgement and insight appear normal. Mood & affect appropriate.   Data Reviewed: I have personally reviewed following labs and imaging studies  Basic Metabolic Panel: Recent Labs  Lab 03/11/24 2320 03/12/24 0323 03/13/24 1534 03/13/24 1933 03/13/24 2218 03/14/24 0206 03/14/24 0517  NA 134*   < > 136 134* 136 138 139  K 2.8*   < > 2.9* 2.9* 3.2* 3.6 3.3*  CL 114*   < > 110 108 110 112* 113*  CO2 <  7*   < > 17* 19* 19* 20* 22  GLUCOSE 180*   < > 169* 166* 170* 156* 153*  BUN 6   < > <5* <5* <5* <5* <5*  CREATININE 0.55   < > <0.30* <0.30* <0.30* 0.30* 0.30*  CALCIUM 7.8*   < > 7.9* 7.7* 7.6* 7.7* 7.8*  MG 1.6*  --   --   --   --   --   --    < > = values in this interval not displayed.   CBC: Recent Labs  Lab 03/11/24 1455 03/13/24 0734  WBC 27.2* 7.6  NEUTROABS 23.0*  --   HGB 18.0* 12.8  HCT 52.6* 34.1*  MCV 95.8 88.1  PLT 506*  194   BNP: Invalid input(s): POCBNP  CBG: Recent Labs  Lab 03/14/24 0533 03/14/24 0618 03/14/24 0746 03/14/24 1202 03/14/24 1627  GLUCAP 150* 166* 201* 270* 218*   HbA1C: No results for input(s): HGBA1C in the last 72 hours.  Urine analysis:    Component Value Date/Time   COLORURINE YELLOW 03/12/2024 1430   APPEARANCEUR CLEAR 03/12/2024 1430   LABSPEC 1.016 03/12/2024 1430   PHURINE 6.0 03/12/2024 1430   GLUCOSEU >=500 (A) 03/12/2024 1430   HGBUR MODERATE (A) 03/12/2024 1430   BILIRUBINUR NEGATIVE 03/12/2024 1430   BILIRUBINUR negative 08/06/2021 1631   KETONESUR 80 (A) 03/12/2024 1430   PROTEINUR 100 (A) 03/12/2024 1430   UROBILINOGEN 0.2 08/06/2021 1631   NITRITE NEGATIVE 03/12/2024 1430   LEUKOCYTESUR MODERATE (A) 03/12/2024 1430   Sepsis Labs: @LABRCNTIP (procalcitonin:4,lacticidven:4) ) Recent Results (from the past 240 hours)  MRSA Next Gen by PCR, Nasal     Status: None   Collection Time: 03/11/24  7:01 PM   Specimen: Nasal Mucosa; Nasal Swab  Result Value Ref Range Status   MRSA by PCR Next Gen NOT DETECTED NOT DETECTED Final    Comment: (NOTE) The GeneXpert MRSA Assay (FDA approved for NASAL specimens only), is one component of a comprehensive MRSA colonization surveillance program. It is not intended to diagnose MRSA infection nor to guide or monitor treatment for MRSA infections. Test performance is not FDA approved in patients less than 26 years old. Performed at [facility], [address] [place], [place] [address]   MRSA Next Gen by PCR, Nasal     Status: None   Collection Time: 03/14/24  8:30 AM   Specimen: Nasal Mucosa; Nasal Swab  Result Value Ref Range Status   MRSA by PCR Next Gen NOT DETECTED NOT DETECTED Final    Comment: (NOTE) The GeneXpert MRSA Assay (FDA approved for NASAL specimens only), is one component of a comprehensive MRSA colonization surveillance program. It is not intended to diagnose MRSA infection nor to guide or  monitor treatment for MRSA infections. Test performance is not FDA approved in patients less than 79 years old. Performed at [facility], [address], [place], [place] [address]      Scheduled Meds:  Chlorhexidine  Gluconate Cloth  6 each Topical Q0600   enoxaparin  (LOVENOX ) injection  40 mg Subcutaneous Q24H   insulin  aspart  0-5 Units Subcutaneous QHS   insulin  aspart  0-9 Units Subcutaneous TID WC   insulin  glargine-yfgn  15 Units Subcutaneous BID   pantoprazole   40 mg Oral Daily   Continuous Infusions: None   Procedures/Studies: DG Chest Portable 1 View Result Date: 03/11/2024 CLINICAL DATA:  Shortness of breath. EXAM: PORTABLE CHEST 1 VIEW COMPARISON:  03/27/2022 FINDINGS: The lungs are clear without focal  pneumonia, edema, pneumothorax or pleural effusion. The cardiopericardial silhouette is within normal limits for size. No acute bony abnormality. Radiopaque foreign body superimposed on the left coracoid process is indeterminate and may be external to the patient. Telemetry leads overlie the chest. IMPRESSION: No active disease. Electronically Signed   By: Camellia Candle M.D.   On: 03/11/2024 16:19    Eric Nunnery, MD  [facility]  If 7PM-7AM, please contact night-coverage [email or web address] Password [facility] 03/14/2024, 5:36 PM   LOS: 3 days

## 2024-03-15 ENCOUNTER — Other Ambulatory Visit (HOSPITAL_BASED_OUTPATIENT_CLINIC_OR_DEPARTMENT_OTHER): Payer: Self-pay

## 2024-03-15 DIAGNOSIS — E876 Hypokalemia: Secondary | ICD-10-CM | POA: Diagnosis not present

## 2024-03-15 DIAGNOSIS — N179 Acute kidney failure, unspecified: Secondary | ICD-10-CM

## 2024-03-15 DIAGNOSIS — F17200 Nicotine dependence, unspecified, uncomplicated: Secondary | ICD-10-CM | POA: Diagnosis not present

## 2024-03-15 DIAGNOSIS — E1010 Type 1 diabetes mellitus with ketoacidosis without coma: Secondary | ICD-10-CM | POA: Diagnosis not present

## 2024-03-15 HISTORY — DX: Hypokalemia: E87.6

## 2024-03-15 LAB — GLUCOSE, CAPILLARY
Glucose-Capillary: 190 mg/dL — ABNORMAL HIGH (ref 70–99)
Glucose-Capillary: 249 mg/dL — ABNORMAL HIGH (ref 70–99)
Glucose-Capillary: 250 mg/dL — ABNORMAL HIGH (ref 70–99)

## 2024-03-15 LAB — BASIC METABOLIC PANEL WITH GFR
Anion gap: 7 (ref 5–15)
BUN: 5 mg/dL — ABNORMAL LOW (ref 6–20)
CO2: 24 mmol/L (ref 22–32)
Calcium: 8.3 mg/dL — ABNORMAL LOW (ref 8.9–10.3)
Chloride: 108 mmol/L (ref 98–111)
Creatinine, Ser: 0.39 mg/dL — ABNORMAL LOW (ref 0.44–1.00)
GFR, Estimated: >60 mL/min (ref >60)
Glucose, Bld: 192 mg/dL — ABNORMAL HIGH (ref 70–99)
Potassium: 3.7 mmol/L (ref 3.5–5.1)
Sodium: 139 mmol/L (ref 135–145)

## 2024-03-15 LAB — CBC
HCT: 34.9 % — ABNORMAL LOW (ref 36.0–46.0)
Hemoglobin: 12.2 g/dL (ref 12.0–15.0)
MCH: 31.9 pg (ref 26.0–34.0)
MCHC: 35 g/dL (ref 30.0–36.0)
MCV: 91.4 fL (ref 80.0–100.0)
Platelets: 255 K/uL (ref 150–400)
RBC: 3.82 MIL/uL — ABNORMAL LOW (ref 3.87–5.11)
RDW: 12.1 % (ref 11.5–15.5)
WBC: 6.4 K/uL (ref 4.0–10.5)
nRBC: 0 % (ref 0.0–0.2)

## 2024-03-15 LAB — MAGNESIUM: Magnesium: 2 mg/dL (ref 1.7–2.4)

## 2024-03-15 MED ORDER — INSULIN GLARGINE 100 UNIT/ML SOLOSTAR PEN
30.0000 [IU] | PEN_INJECTOR | Freq: Every day | SUBCUTANEOUS | 11 refills | Status: DC
  Filled 2024-03-15: qty 9, 30d supply, fill #0
  Filled 2024-03-15: qty 15, 50d supply, fill #0
  Filled 2024-04-02: qty 9, 30d supply, fill #1
  Filled ????-??-??: fill #1

## 2024-03-15 MED ORDER — FREESTYLE LITE W/DEVICE KIT
PACK | 0 refills | Status: AC
  Filled 2024-03-15: qty 1, 30d supply, fill #0

## 2024-03-15 MED ORDER — BLOOD GLUCOSE TEST VI STRP
1.0000 | ORAL_STRIP | Freq: Three times a day (TID) | 3 refills | Status: AC
  Filled 2024-03-15: qty 100, 34d supply, fill #0
  Filled 2024-04-15: qty 100, 34d supply, fill #1
  Filled 2024-06-11: qty 100, 34d supply, fill #2
  Filled 2024-08-08: qty 100, 34d supply, fill #3

## 2024-03-15 MED ORDER — INSULIN PEN NEEDLE 31G X 8 MM MISC
3 refills | Status: AC
  Filled 2024-03-15 – 2024-06-11 (×2): qty 200, 50d supply, fill #0
  Filled 2024-08-08: qty 200, 50d supply, fill #1

## 2024-03-15 MED ORDER — FREESTYLE LANCETS MISC
0 refills | Status: AC
  Filled 2024-03-15: qty 100, 34d supply, fill #0

## 2024-03-15 MED ORDER — INSULIN LISPRO (1 UNIT DIAL) 100 UNIT/ML (KWIKPEN)
10.0000 [IU] | PEN_INJECTOR | Freq: Three times a day (TID) | SUBCUTANEOUS | 3 refills | Status: DC
  Filled 2024-03-15: qty 9, 30d supply, fill #0
  Filled 2024-04-02 (×2): qty 9, 30d supply, fill #1

## 2024-03-15 MED ORDER — LANCET DEVICE MISC
1.0000 | Freq: Three times a day (TID) | 0 refills | Status: AC
  Filled 2024-03-15 – 2024-06-11 (×2): qty 1, 30d supply, fill #0

## 2024-03-15 MED ORDER — PANTOPRAZOLE SODIUM 40 MG PO TBEC
40.0000 mg | DELAYED_RELEASE_TABLET | Freq: Every day | ORAL | 1 refills | Status: DC
Start: 2024-03-16 — End: 2024-04-22
  Filled 2024-03-15: qty 30, 30d supply, fill #0

## 2024-03-15 NOTE — Discharge Summary (Signed)
 Physician Discharge Summary   Patient: Shelby Rubio MRN: [id number] DOB: September 16, 2000  Admit date:     03/11/2024  Discharge date: 03/15/24  Discharge Physician: Eric Nunnery   PCP: Shelby Krabbe, NP   Recommendations at discharge:  Continue close monitoring of patient's CBGs/A1c with further adjustment to hypoglycemic regimen as required Repeat basic metabolic panel to follow electrolytes and renal function Reassess blood pressure and determine the need of antihypertensive agents. Continue assisting patient with tobacco abuse. Repeat CBC to follow WBCs and hemoglobin trend/stability.  Discharge Diagnoses: Principal Problem:   DKA (diabetic ketoacidosis) (HCC) Active Problems:   Current smoker   Moderate episode of recurrent major depressive disorder (HCC)   Hypertension associated with diabetes (HCC)   Type 1 diabetes mellitus with hyperglycemia (HCC)   AKI (acute kidney injury) (HCC)   Hypokalemia  Brief History:  Patient is a 23 year old with history of anxiety and depression and type 1 diabetes who has been out of her insulin  for the past week.  She reports increasing shortness of breath today as well as nausea and vomiting.  In the ED she was noted to be in DKA with a beta hydroxybutyrate greater than 8 a bicarb less than 7 and a venous pH of 6.95.  Her initial CBG was 305.  Patient was given IV fluid resuscitation and IV insulin  in the ED and we are asked to admit.   Assessment and Plan: DKA - After insulin  drip and close monitoring of patient's CBGs/BMET patient has been successfully transitioned to a sliding scale insulin  and long-acting insulin . - Patient advised to maintain adequate hydration and to follow modified carbohydrate diet. - Previous 09/19/2023 HbA1C--13.7 - 03/12/24 A1C--15.1 - Follow CBG fluctuation and further adjust hypoglycemia regimen as required.   Hypokalemia/Hypomagnesmia - In the setting of dehydration, decreased oral intake and ongoing therapy for  hyperglycemia with insulin  drip. - Continue electrolytes repletion and follow trend. -Potassium within normal limits at discharge.   Leukemoid reaction - Likely secondary to stress demargination - No acute signs of infection appreciated - Continue to follow WBCs trend - Maintain adequate hydration.   Hyponatremia - In the setting of dehydration and hyperglycemia -Patient advised to maintain adequate hydration. -Continue to follow electrolytes trend.    Tobacco abuse - Cessation counseling provided.  History of hypertension - Blood pressure stable and currently not taking any antihypertensive agent - Continue to follow vital signs - Patient advised to maintain adequate hydration and to follow heart healthy/low-sodium diet.  Consultants: Diabetes coordinator Procedures performed: See below for x-ray reports Disposition: Home Diet recommendation: Heart healthy/modified carbohydrate diet.  DISCHARGE MEDICATION: Allergies as of 03/15/2024       Reactions   Penicillins Hives   Amoxil [amoxicillin] Hives        Medication List     TAKE these medications    Accu-Chek Guide w/Device Kit Use as directed to check blood glucose three times daily in the morning, at noon, and at bedtime.   Accu-Chek Softclix Lancets lancets Use as directed to check blood glucose three times daily in the morning, at noon, and at bedtime.   BLOOD GLUCOSE TEST STRIPS Strp Use 1 each in the morning, at noon, and at bedtime.   insulin  glargine 100 UNIT/ML Solostar Pen Commonly known as: LANTUS  Inject 30 Units into the skin at bedtime.   insulin  lispro 200 UNIT/ML KwikPen Commonly known as: HUMALOG  Inject 10 Units into the skin 3 (three) times daily with meals.   Insulin  Pen Needle 31G  X 8 MM Misc Use to inject insulin  4 times a day as instructed   Lancet Device Misc 1 each by Does not apply route in the morning, at noon, and at bedtime. May substitute to any manufacturer covered by  patient's insurance.   levonorgestrel  20 MCG/DAY Iud Commonly known as: MIRENA  1 each by Intrauterine route once.   pantoprazole  40 MG tablet Commonly known as: PROTONIX  Take 1 tablet (40 mg total) by mouth daily. Start taking on: March 16, 2024        Follow-up Information     Shelby Krabbe, NP. Schedule an appointment as soon as possible for a visit in 2 week(s).   Specialty: Family Medicine Contact information: [address] [place] [address] [phone]                Discharge Exam: Shelby Rubio   03/11/24 1446 03/11/24 1905  Weight: 67.1 kg 65 kg   General exam: Alert, awake, oriented x 3 Respiratory system: Clear to auscultation. Respiratory effort normal. Cardiovascular system:RRR. No murmurs, rubs, gallops. Gastrointestinal system: Abdomen is nondistended, soft and nontender. No organomegaly or masses felt. Normal bowel sounds heard. Central nervous system: Alert and oriented. No focal neurological deficits. Extremities: No C/C/E, +pedal pulses Skin: No rashes, lesions or ulcers Psychiatry: Judgement and insight appear normal. Mood & affect appropriate.    Condition at discharge: Stable and improved.  The results of significant diagnostics from this hospitalization (including imaging, microbiology, ancillary and laboratory) are listed below for reference.   Imaging Studies: DG Chest Portable 1 View Result Date: 03/11/2024 CLINICAL DATA:  Shortness of breath. EXAM: PORTABLE CHEST 1 VIEW COMPARISON:  03/27/2022 FINDINGS: The lungs are clear without focal pneumonia, edema, pneumothorax or pleural effusion. The cardiopericardial silhouette is within normal limits for size. No acute bony abnormality. Radiopaque foreign body superimposed on the left coracoid process is indeterminate and may be external to the patient. Telemetry leads overlie the chest. IMPRESSION: No active disease. Electronically Signed   By: Camellia Candle M.D.   On:  03/11/2024 16:19    Microbiology: Results for orders placed or performed during the hospital encounter of 03/11/24  MRSA Next Gen by PCR, Nasal     Status: None   Collection Time: 03/11/24  7:01 PM   Specimen: Nasal Mucosa; Nasal Swab  Result Value Ref Range Status   MRSA by PCR Next Gen NOT DETECTED NOT DETECTED Final    Comment: (NOTE) The GeneXpert MRSA Assay (FDA approved for NASAL specimens only), is one component of a comprehensive MRSA colonization surveillance program. It is not intended to diagnose MRSA infection nor to guide or monitor treatment for MRSA infections. Test performance is not FDA approved in patients less than 65 years old. Performed at [facility], [address] [place], [place] [address]   MRSA Next Gen by PCR, Nasal     Status: None   Collection Time: 03/14/24  8:30 AM   Specimen: Nasal Mucosa; Nasal Swab  Result Value Ref Range Status   MRSA by PCR Next Gen NOT DETECTED NOT DETECTED Final    Comment: (NOTE) The GeneXpert MRSA Assay (FDA approved for NASAL specimens only), is one component of a comprehensive MRSA colonization surveillance program. It is not intended to diagnose MRSA infection nor to guide or monitor treatment for MRSA infections. Test performance is not FDA approved in patients less than 5 years old. Performed at [facility], [address] [place], [place] [address]     Labs:  CBC: Recent Labs  Lab 03/11/24 1455 03/13/24 0734 03/15/24 0409  WBC 27.2* 7.6 6.4  NEUTROABS 23.0*  --   --   HGB 18.0* 12.8 12.2  HCT 52.6* 34.1* 34.9*  MCV 95.8 88.1 91.4  PLT 506* 194 255   Basic Metabolic Panel: Recent Labs  Lab 03/11/24 2320 03/12/24 0323 03/13/24 1933 03/13/24 2218 03/14/24 0206 03/14/24 0517 03/15/24 0409  NA 134*   < > 134* 136 138 139 139  K 2.8*   < > 2.9* 3.2* 3.6 3.3* 3.7  CL 114*   < > 108 110 112* 113* 108  CO2 <7*   < > 19* 19* 20* 22 24  GLUCOSE 180*   < > 166* 170* 156* 153* 192*  BUN 6   < > <5* <5*  <5* <5* 5*  CREATININE 0.55   < > <0.30* <0.30* 0.30* 0.30* 0.39*  CALCIUM 7.8*   < > 7.7* 7.6* 7.7* 7.8* 8.3*  MG 1.6*  --   --   --   --   --  2.0   < > = values in this interval not displayed.   CBG: Recent Labs  Lab 03/14/24 1931 03/14/24 2258 03/15/24 0005 03/15/24 0721 03/15/24 1143  GLUCAP 311* 194* 190* 250* 249*    Discharge time spent:  35 minutes.  Signed: Eric Nunnery, MD [facility] 03/15/2024

## 2024-03-15 NOTE — Plan of Care (Signed)
   Problem: Education: Goal: Knowledge of General Education information will improve Description: Including pain rating scale, medication(s)/side effects and non-pharmacologic comfort measures Outcome: Progressing   Problem: Clinical Measurements: Goal: Will remain free from infection Outcome: Progressing   Problem: Activity: Goal: Risk for activity intolerance will decrease Outcome: Progressing

## 2024-03-15 NOTE — Plan of Care (Signed)

## 2024-03-15 NOTE — TOC Transition Note (Addendum)
 Transition of Care [facility]) - Discharge Note   Patient Details  Name: Shelby Rubio MRN: [id number] Date of Birth: 04/20/2001  Transition [facility] CM/SW Contact:  Sharlyne Stabs, RN Phone Number: 03/15/2024, 11:22 AM   Clinical Narrative:   CM/ MD at the bedside. Parent and children in the room. Patient has insurance, but having trouble paying for medications. She is a single parent, living with her parents. GOOD RX card given, MD and DM coordinator will send in new medications that will cost less.  TOC also gave the free clinic medication assistance brochure for patient to follow up to see if they have any other programs.  Also advised patient to contact the medication companies. TOC printed application for [employer]. Patient understands. Patient will also contact social services to see if she will qualify for medicaid.     Final next level of care: Home/Self Care    Patient Goals and CMS Choice Patient states their goals for this hospitalization and ongoing recovery are:: Return home CMS Medicare.gov Compare Post Acute Care list provided to:: Patient    Discharge Placement      Patient and family notified of of transfer: 03/15/24  Discharge Plan and Services Additional resources added to the After Visit Summary for     Social Drivers of Health (SDOH) Interventions SDOH Screenings   Food Insecurity: No Food Insecurity (03/11/2024)  Housing: Low Risk  (03/11/2024)  Transportation Needs: No Transportation Needs (03/11/2024)  Utilities: Not At Risk (03/11/2024)  Alcohol Screen: Low Risk  (09/19/2023)  Depression (PHQ2-9): High Risk (09/19/2023)  Financial Resource Strain: Patient Declined (09/19/2023)  Physical Activity: Unknown (09/19/2023)  Social Connections: Socially Isolated (09/19/2023)  Stress: Stress Concern Present (09/19/2023)  Tobacco Use: High Risk (03/11/2024)     Readmission Risk Interventions    03/12/2024   10:41 AM  Readmission Risk Prevention Plan   Post Dischage Appt Not Complete  Medication Screening Complete  Transportation Screening Complete

## 2024-03-15 NOTE — Inpatient Diabetes Management (Signed)
 Inpatient Diabetes Program Recommendations  AACE/ADA: New Consensus Statement on Inpatient Glycemic Control (2015)  Target Ranges:  Prepandial:   less than 140 mg/dL      Peak postprandial:   less than 180 mg/dL (1-2 hours)      Critically ill patients:  140 - 180 mg/dL   Lab Results  Component Value Date   GLUCAP 250 (H) 03/15/2024   HGBA1C 15.1 (H) 03/11/2024    Review of Glycemic Control  Latest Reference Range & Units 03/14/24 16:27 03/14/24 19:31 03/14/24 22:58 03/15/24 00:05 03/15/24 07:21  Glucose-Capillary 70 - 99 mg/dL 781 (H) 688 (H) 805 (H) 190 (H) 250 (H)  (H): Data is abnormally high Diabetes history: DM2 Outpatient Diabetes medications: Lantus  20 units QHS, Metformin  XR 750 mg BID, Glipizide  XL 5 mg QAM Current orders for Inpatient glycemic control: Semglee  15 units BID, Novolog  0-9 units TID with meals, Novolog  0-5 units QHS   Inpatient Diabetes Program Recommendations:    Consider adding Novolog  4 units TID with meals if patient eats at least 50% of meals.    Labs:  GAD antibodies, insulin  autoantibodies, and zinc transporter 8 (ZnT8) antibodies in process (will help determine which type of diabetes patient has).    Outpatient DM: Recommend sending prescriptions to [facility] in [place] ([address], [place], [place]; [phone] for 30 day supply so that copay would be $25 each); prescribing Lantus  SoloStar [phone]) 20 units at bedtime, insulin  pen needles [phone]), and if short acting insulin  is prescribed Humalog  Kwikpens [phone]).   Thanks, Tinnie Minus, MSN, RNC-OB Diabetes Coordinator [phone] (8a-5p)

## 2024-03-17 DIAGNOSIS — E1165 Type 2 diabetes mellitus with hyperglycemia: Secondary | ICD-10-CM | POA: Diagnosis not present

## 2024-03-17 DIAGNOSIS — R42 Dizziness and giddiness: Secondary | ICD-10-CM | POA: Diagnosis not present

## 2024-03-17 DIAGNOSIS — R5383 Other fatigue: Secondary | ICD-10-CM | POA: Diagnosis not present

## 2024-03-17 DIAGNOSIS — E1065 Type 1 diabetes mellitus with hyperglycemia: Secondary | ICD-10-CM | POA: Diagnosis not present

## 2024-03-18 ENCOUNTER — Telehealth: Payer: Self-pay | Admitting: *Deleted

## 2024-03-18 ENCOUNTER — Telehealth: Admitting: *Deleted

## 2024-03-18 DIAGNOSIS — E109 Type 1 diabetes mellitus without complications: Secondary | ICD-10-CM

## 2024-03-18 NOTE — Transitions of Care (Post Inpatient/ED Visit) (Signed)
 03/18/2024  Name: Shelby Rubio MRN: [id number] DOB: 05/08/2001  Today's TOC FU Call Status: Today's TOC FU Call Status:: Successful TOC FU Call Completed TOC FU Call Complete Date: 03/18/24 Patient's Name and Date of Birth confirmed.  Transition Care Management Follow-up Telephone Call Date of Discharge: 03/15/24 [facility] Type of Discharge: Inpatient Admission Primary Inpatient Discharge Diagnosis:: DKA (diabetic ketoacidosis) How have you been since you were released from the hospital?:  (eating, drinking well, independent with all aspects of her care) Any questions or concerns?: Yes Patient Questions/Concerns:: can't afford lantus  insulin  Patient Questions/Concerns Addressed: Other: (pharmacy referral completed, pt has insulin  on hand, states this will be a problem going forward)  Items Reviewed: Did you receive and understand the discharge instructions provided?: Yes Medications obtained,verified, and reconciled?: Yes (Medications Reviewed) Any new allergies since your discharge?: No Dietary orders reviewed?: Yes Type of Diet Ordered:: carbohydrate modified Do you have support at home?: Yes People in Home [RPT]: parent(s) Name of Support/Comfort Primary Source: lives with parents Shelby and Shelby Rubio, has young child  Medications Reviewed Today: Medications Reviewed Today     Reviewed by Aura Mliss LABOR, RN (Registered Nurse) on 03/18/24 at 1048  Med List Status: <None>   Medication Order Taking? Sig Documenting Provider Last Dose Status Informant  Blood Glucose Monitoring Suppl (FREESTYLE LITE) w/Device KIT [id number] Yes Use in the morning, at noon, and at bedtime. Ricky Fines, MD  Active   Glucose Blood (BLOOD GLUCOSE TEST STRIPS) STRP [id number] Yes Use 1 each in the morning, at noon, and at bedtime. Ricky Fines, MD  Active   insulin  glargine (LANTUS ) 100 UNIT/ML Solostar Pen [id number] Yes Inject 30 Units into the skin at bedtime. Ricky Fines, MD  Active   insulin  lispro (HUMALOG ) 100 UNIT/ML KwikPen [id number] Yes Inject 10 Units into the skin 3 (three) times daily with meals. Ricky Fines, MD  Active   Insulin  Pen Needle 31G X 8 MM MISC [id number] Yes Use to inject insulin  4 times a day as instructed Ricky Fines, MD  Active   Insulin  Pen Needle 32G X 4 MM MISC [id number] Yes Use 1 pen needle once daily. Lucius Krabbe, NP  Active   Lancet Device MISC [id number] Yes 1 each by Does not apply route in the morning, at noon, and at bedtime. May substitute to any manufacturer covered by patient's insurance. Ricky Fines, MD  Active   Lancets (FREESTYLE) lancets [id number] Yes Use 1 each in the morning, at noon, and at bedtime. Ricky Fines, MD  Active   levonorgestrel  (MIRENA ) 20 MCG/DAY IUD [id number]  1 each by Intrauterine route once. [provider]  Active Self  pantoprazole  (PROTONIX ) 40 MG tablet [id number] Yes Take 1 tablet (40 mg total) by mouth daily. Ricky Fines, MD  Active             Home Care and Equipment/Supplies: Were Home Health Services Ordered?: No Any new equipment or medical supplies ordered?: No  Functional Questionnaire: Do you need assistance with bathing/showering or dressing?: No Do you need assistance with meal preparation?: No Do you need assistance with eating?: No Do you have difficulty maintaining continence: No Do you need assistance with getting out of bed/getting out of a chair/moving?: No Do you have difficulty managing or taking your medications?: Yes (unable to afford insulin , has all medications on hand, but worries about affordability in the future)  Follow up appointments reviewed: PCP Follow-up appointment confirmed?: Yes Date of PCP follow-up appointment?:  03/20/24 Follow-up Provider: Corean Comment NP  @ 130 pm Specialist Hospital Follow-up appointment confirmed?: NA (pt states [facility] referred her to endocrinologist, she is awaiting on  details) Do you need transportation to your follow-up appointment?: No Do you understand care options if your condition(s) worsen?: Yes-patient verbalized understanding  SDOH Interventions Today    Flowsheet Row Most Recent Value  SDOH Interventions   Food Insecurity Interventions Intervention Not Indicated  Housing Interventions Intervention Not Indicated  Transportation Interventions Intervention Not Indicated  Utilities Interventions Intervention Not Indicated    Goals Addressed             This Visit's Progress    VBCI Transitions of Care (TOC) Care Plan       Problems:  Recent Hospitalization for treatment of DMII- type 1/ DKA Medication access barrier pt states she cannot afford lantus  insulin , has on hand now but this will be an issue going forward, before hospitalization pt ran out of insulin  Patient states she is going to social services to apply for medicaid, did not qualify for food stamps, she and 31 year old daughter live with her parents, pt states she has enough money for food, to pay her bills, but insulin  cost is an ongoing burden due to cost, pt agreeable to pharmacy referral, declines social work referral Patient discharged from [facility] on 8/15 with DKA, pt reports she went to [facility] ED for a 2nd opinion and  they told me everything is fine, nothing new to report and sent me home  Goal:  Over the next 30 days, the patient will not experience hospital readmission  Interventions:  Diabetes Interventions: Assessed patient's understanding of A1c goal: <7% Provided education to patient about basic DM disease process Reviewed medications with patient and discussed importance of medication adherence Counseled on importance of regular laboratory monitoring as prescribed Discussed plans with patient for ongoing care management follow up and provided patient with direct contact information for care management team Reviewed scheduled/upcoming provider appointments  including: collaborated with care guide, scheduled post hospital follow up for 03/20/24 @ 130 pm Referral made to pharmacy team for assistance with insulin  affordability, medication assistance Review of patient status, including review of consultants reports, relevant laboratory and other test results, and medications completed Screening for signs and symptoms of depression related to chronic disease state  Assessed social determinant of health barriers Lab Results  Component Value Date   HGBA1C 15.1 (H) 03/11/2024  Reviewed carbohydrate modified diet, plate method Reviewed effects of uncontrolled blood sugar on the body/ organs  Patient Self Care Activities:  Attend all scheduled provider appointments Attend church or other social activities Call pharmacy for medication refills 3-7 days in advance of running out of medications Call provider office for new concerns or questions  Notify RN Care Manager of TOC call rescheduling needs Participate in Transition of Care Program/Attend TOC scheduled calls Perform all self care activities independently  Perform IADL's (shopping, preparing meals, housekeeping, managing finances) independently Take medications as prescribed   Work with the pharmacist to address medication management needs and will continue to work with the clinical team to address health care and disease management related needs check blood sugar at prescribed times: 4 times daily check feet daily for cuts, sores or redness enter blood sugar readings and medication or insulin  into daily log take the blood sugar log to all doctor visits trim toenails straight across drink 6 to 8 glasses of water each day fill half of plate with vegetables limit  fast food meals to no more than 1 per week manage portion size prepare main meal at home 3 to 5 days each week set a realistic goal wash and dry feet carefully every day wear comfortable, cotton socks wear comfortable, well-fitting  shoes Pharmacist will be in touch with you about assistance with insulin  affordability  Plan:  Telephone follow up appointment with care management team member scheduled for:  03/25/24 @ 1015 am The patient has been provided with contact information for the care management team and has been advised to call with any health related questions or concerns.         Mliss Creed [facility], BSN RN Care Manager/ Transition of Care / [facility] [phone]

## 2024-03-20 ENCOUNTER — Encounter: Payer: Self-pay | Admitting: Pharmacist

## 2024-03-20 ENCOUNTER — Telehealth: Payer: Self-pay | Admitting: Pharmacist

## 2024-03-20 ENCOUNTER — Ambulatory Visit (INDEPENDENT_AMBULATORY_CARE_PROVIDER_SITE_OTHER): Admitting: Family

## 2024-03-20 ENCOUNTER — Other Ambulatory Visit (HOSPITAL_BASED_OUTPATIENT_CLINIC_OR_DEPARTMENT_OTHER): Payer: Self-pay

## 2024-03-20 ENCOUNTER — Encounter: Payer: Self-pay | Admitting: Family

## 2024-03-20 VITALS — BP 110/71 | HR 85 | Temp 98.2°F | Ht 64.0 in | Wt 157.4 lb

## 2024-03-20 DIAGNOSIS — H60502 Unspecified acute noninfective otitis externa, left ear: Secondary | ICD-10-CM | POA: Diagnosis not present

## 2024-03-20 DIAGNOSIS — E1065 Type 1 diabetes mellitus with hyperglycemia: Secondary | ICD-10-CM | POA: Diagnosis not present

## 2024-03-20 DIAGNOSIS — H65191 Other acute nonsuppurative otitis media, right ear: Secondary | ICD-10-CM | POA: Diagnosis not present

## 2024-03-20 DIAGNOSIS — L700 Acne vulgaris: Secondary | ICD-10-CM

## 2024-03-20 LAB — BASIC METABOLIC PANEL WITH GFR
BUN: 11 mg/dL (ref 6–23)
CO2: 29 meq/L (ref 19–32)
Calcium: 9.2 mg/dL (ref 8.4–10.5)
Chloride: 104 meq/L (ref 96–112)
Creatinine, Ser: 0.46 mg/dL (ref 0.40–1.20)
GFR: 133.36 mL/min (ref >60.00)
Glucose, Bld: 138 mg/dL — ABNORMAL HIGH (ref 70–99)
Potassium: 4.1 meq/L (ref 3.5–5.1)
Sodium: 140 meq/L (ref 135–145)

## 2024-03-20 MED ORDER — NEOMYCIN-POLYMYXIN-HC 3.5-10000-1 OT SOLN
3.0000 [drp] | Freq: Four times a day (QID) | OTIC | 0 refills | Status: DC
  Filled 2024-03-20: qty 10, 17d supply, fill #0

## 2024-03-20 MED ORDER — DEXCOM G7 SENSOR MISC: 2 refills | Status: DC

## 2024-03-20 MED ORDER — DOXYCYCLINE HYCLATE 100 MG PO TABS
100.0000 mg | ORAL_TABLET | Freq: Two times a day (BID) | ORAL | 0 refills | Status: AC
  Filled 2024-03-20: qty 28, 14d supply, fill #0

## 2024-03-20 NOTE — Patient Instructions (Addendum)
 It was very nice to see you today!   Increase the Lantus  to 36 units at bedtime. Increase the Humalog  to 12 units before meals. Check fasting blood sugar - goal is less than 150. Check before each meal blood sugar - goal is less than 180.  Call me with readings after 1-2 weeks, or can send MyChart message.   I have sent in Doxycycline  for your right ear infection and ear drops for the left ear. The doxy will also help your cystic lesions. Use sunscreen and cover up if outside in the sun while taking as it can cause burning.  Please schedule a 1 month follow up visit, ok if virtual.      PLEASE NOTE:  If you had any lab tests please let us  know if you have not heard back within a few days. You may see your results on MyChart before we have a chance to review them but we will give you a call once they are reviewed by us . If we ordered any referrals today, please let us  know if you have not heard from their office within the next week.

## 2024-03-20 NOTE — Progress Notes (Unsigned)
 03/20/2024 Name: Shelby Rubio MRN: [id number] DOB: 2000-12-13  Chief Complaint  Patient presents with   Diabetes   Medication Management    Shelby Rubio is a 23 y.o. year old female who presented for a telephone visit.   They were referred to the pharmacist by their PCP for assistance in managing diabetes and medication access.    Subjective:  Care Team: Primary Care Provider: Lucius Krabbe, NP ; Next Scheduled Visit: 03/20/2024   Medication Access/Adherence  Current Pharmacy:  [facility] [address] [place] [address] [place] [address] [place] [address] [place] [address] Phone: [phone] Fax: [phone]  [facility] [address] [place] [address] Phone: [phone] Fax: [phone]   Patient reports affordability concerns with their medications: Yes  - patient had stopped insulin  therapy due to cost. One recent hospitalization and 1 recent ED visits for DKA / hyperglycemia.  Looks like when [employer] tried to fill her insulin  for a full box of 5 pens the cost was > $100 for each insulin  due to it was > 30 days supply.  [facility] pharmacy in [place] fill for #3 pens = 30 day supply and cost of Lantus  was $25 and cost of Humalog  was $25 - patient states this is affordable.  Patient reports access/transportation concerns to their pharmacy: No  Patient reports adherence concerns with their medications:  Yes  - due to medication costs.    Diabetes:  Current medications: Lantus  / insulin  glargine - inject 30 units once a day Humalog  / insulin  lispro - inject 10 units three times a day with meals.   Medications tried in the past: Semglee , glipizide , Trulicity   Current glucose readings:  03/16/24 - morning blood glucose 185 03/17/24 - morning blood glucose 261 03/18/24 - morning blood glucose 234 03/19/24 - morning blood glucose 239 03/20/24 - morning blood glucose 137  Patient reports hypoglycemic s/sx  including dizziness and lightheadedness. She states fatigue is improving. Patient denies hyperglycemic symptoms including no polyuria, polydipsia, polyphagia, nocturia, neuropathy.   Current medication access support: none   Objective:  Lab Results  Component Value Date   HGBA1C 15.1 (H) 03/11/2024    Lab Results  Component Value Date   CREATININE 0.39 (L) 03/15/2024   BUN 5 (L) 03/15/2024   NA 139 03/15/2024   K 3.7 03/15/2024   CL 108 03/15/2024   CO2 24 03/15/2024    Lab Results  Component Value Date   CHOL 146 10/26/2022   HDL 33 (L) 10/26/2022   LDLCALC 89 10/26/2022   TRIG 137 10/26/2022   CHOLHDL 4.4 10/26/2022   Current Outpatient Medications  Medication Instructions   Blood Glucose Monitoring Suppl (FREESTYLE LITE) w/Device KIT Use in the morning, at noon, and at bedtime.   Glucose Blood (BLOOD GLUCOSE TEST STRIPS) STRP Use 1 each in the morning, at noon, and at bedtime.   insulin  lispro (HUMALOG ) 10 Units, Subcutaneous, 3 times daily with meals   Insulin  Pen Needle 31G X 8 MM MISC Use to inject insulin  4 times a day as instructed   Insulin  Pen Needle 32G X 4 MM MISC Use 1 pen needle once daily.   Lancet Device MISC 1 each, Does not apply, 3 times daily, May substitute to any manufacturer covered by patient's insurance.   Lancets (FREESTYLE) lancets Use 1 each in the morning, at noon, and at bedtime.   Lantus  SoloStar 30 Units, Subcutaneous, Daily at bedtime  levonorgestrel  (MIRENA ) 20 MCG/DAY IUD 1 each,  Once   pantoprazole  (PROTONIX ) 40 mg, Oral, Daily     Assessment/Plan:   Diabetes: Currently uncontrolled per A1c but home blood glucose readings are showing improved blood glucose since patient restarted Lantus  and short acting insulin  was added.  - Reviewed goal A1c, goal fasting, and goal 2 hour post prandial glucose - Recommend to continue Lantus  30 units once a day (patient will continue to get insulin  at [facility] since they can break boxes  of insulin  and dispense 30 days at a time.  - Continue insulin  lispro 10 units 3 times a day.  - Called her insurance (pharmacy prior authorization number is [id number]) - they prefer DexCom G7. Provided information for prior authorization. Prior authorization was approved for 1 year - Ref # [id number]  - She did see Corean Comment, NP today and was given a sample of Libre 3 plus sensor. Patient will wear for the next 15 days and will then transition over the the Dex Com G7. I will check in with her in about 2 weeks to see if she has any questions about starting DexCom G7.    Follow Up Plan: 2 weeks.   Madelin Ray, PharmD Clinical Pharmacist [facility]  Population Health [phone]

## 2024-03-20 NOTE — Telephone Encounter (Signed)
 Patient to start using Continuous Glucose Monitor to monitor blood glucose. She was given sample of Libre 3 plus in office today but her insurance prefers Dex Com G7. Still needed prior authorization but was approved. Ref# [id number]  Sending request for DexCom G7 sensors to PCP.

## 2024-03-20 NOTE — Progress Notes (Unsigned)
 Patient ID: Shelby Rubio, female    DOB: November 18, 2000, 23 y.o.   MRN: [id number]  Chief Complaint  Patient presents with   Hospitalization Follow-up    03/17/2024 [facility] - EMERGENCY DEPARTMENT  Primary Diagnosis Hyperglycem    Discussed the use of AI scribe software for clinical note transcription with the patient, who gave verbal consent to proceed.  History of Present Illness Shelby Rubio is a 23 year old female with diabetes who presents with difficulty managing blood sugar levels and recent diabetic ketoacidosis (DKA).  Glycemic control and diabetic ketoacidosis - Difficulty managing blood glucose levels - Recent episode of diabetic ketoacidosis following a two-week period without insulin  due to financial constraints - Current insulin  regimen: Lantus  30 units at bedtime and Humalog  10 units three times a day - Fasting blood glucose levels range from 220 to 260 mg/dL - Manual blood glucose monitoring - Hemoglobin A1c is 15%  Cutaneous manifestations of hyperglycemia - Skin rash on bilateral legs and arms that crust and itch when blood glucose is elevated - Cystic acne-like lesions on face and neck associated with elevated blood glucose  Otologic symptoms - Recent ear pain and drainage during hospitalization - Excessive cerumen noted during hospitalization - Current left ear pain  Assessment & Plan Type 1 diabetes mellitus with poor glycemic control and recent diabetic ketoacidosis Poor glycemic control with fasting blood sugars 220-260 mg/dL and J8r 84%. Recent DKA due to inability to afford insulin . Now under [facility] pharmacy assistance in [place]. Current insulin  regimen requires adjustment. Previous Freestyle Libre CGM was inaccurate, but newer models available. - Increase Lantus  to 36 units at bedtime. - Increase Humalog  to 12 units before meals. - Provide sample of the latest Freestyle Libre 3 plus for trial. - Prescribe Dexcom CGM per pharmacy  recommendation (Tammy, Rph) - Instruct to check fasting blood sugar with a goal of less than 150 mg/dL. - Instruct to check blood sugar before each meal with a goal of less than 180 mg/dL. - Advise to administer Humalog  before meals unless blood sugar is below 100 mg/dL. - Encourage tracking of blood sugar readings and follow up in 1-2 weeks. - Office f/u in 1 month  Chronic extremity skin lesions associated with diabetes Chronic skin lesions on lower extremities correlate with high blood sugar, scarring, itchy, and crusting over.  Left otitis externa Suspected fungal otitis externa with whitish drainage and discomfort. - Prescribe antifungal ear drops for left ear.  Right otitis media Bacterial otitis media in right ear with referred pain to left ear. Allergic to penicillin and amoxicillin. - Prescribe doxycycline  for 2 weeks for right ear infection. - Call office if sx are not improved after finishing abt  Cystic acne Cystic acne flares associated with high blood sugar levels. Previous doxycycline  treatment for acne. - Prescribe doxycycline  for 2 weeks.  Subjective:    Outpatient Medications Prior to Visit  Medication Sig Dispense Refill   Blood Glucose Monitoring Suppl (FREESTYLE LITE) w/Device KIT Use in the morning, at noon, and at bedtime. 1 kit 0   Glucose Blood (BLOOD GLUCOSE TEST STRIPS) STRP Use 1 each in the morning, at noon, and at bedtime. 100 each 3   insulin  glargine (LANTUS ) 100 UNIT/ML Solostar Pen Inject 30 Units into the skin at bedtime. 15 mL 11   insulin  lispro (HUMALOG ) 100 UNIT/ML KwikPen Inject 10 Units into the skin 3 (three) times daily with meals. 9 mL 3   Insulin  Pen Needle 31G  X 8 MM MISC Use to inject insulin  4 times a day as instructed 150 each 3   Insulin  Pen Needle 32G X 4 MM MISC Use 1 pen needle once daily. 100 each 2   Lancet Device MISC 1 each by Does not apply route in the morning, at noon, and at bedtime. May substitute to any manufacturer  covered by patient's insurance. 1 each 0   Lancets (FREESTYLE) lancets Use 1 each in the morning, at noon, and at bedtime. 100 each 0   levonorgestrel  (MIRENA ) 20 MCG/DAY IUD 1 each by Intrauterine route once.     pantoprazole  (PROTONIX ) 40 MG tablet Take 1 tablet (40 mg total) by mouth daily. 30 tablet 1   No facility-administered medications prior to visit.   Past Medical History:  Diagnosis Date   Abnormal menses 06/06/2018   Anxiety 2011?   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   DX in 09/2021, pt has struggled w/med compliance, but has lost weight on her own     Past Surgical History:  Procedure Laterality Date   ADENOIDECTOMY     TONSILLECTOMY     tubes in ears     Allergies  Allergen Reactions   Penicillins Hives   Amoxil [Amoxicillin] Hives      Objective:    Physical Exam Vitals and nursing note reviewed.  Constitutional:      Appearance: Normal appearance.  HENT:     Right Ear: Tympanic membrane is injected and erythematous.     Left Ear: Left ear drainage: whitish drainage.  Cardiovascular:     Rate and Rhythm: Normal rate and regular rhythm.  Pulmonary:     Effort: Pulmonary effort is normal.     Breath sounds: Normal breath sounds.  Musculoskeletal:        General: Normal range of motion.  Skin:    General: Skin is warm and dry.     Findings: Acne (cystic lesions) and rash (multiple darkened red spots/scabs on bilateral legs and arms) present.  Neurological:     Mental Status: She is alert.  Psychiatric:        Mood and Affect: Mood normal.        Behavior: Behavior  normal.    BP 110/71   Pulse 85   Temp 98.2 F (36.8 C)   Ht 5' 4 (1.626 m)   Wt 157 lb 6.4 oz (71.4 kg)   SpO2 94%   BMI 27.02 kg/m  Wt Readings from Last 3 Encounters:  03/20/24 157 lb 6.4 oz (71.4 kg)  03/11/24 143 lb 4.8 oz (65 kg)  09/19/23 156 lb 12.8 oz (71.1 kg)      Lucius Krabbe, NP

## 2024-03-22 ENCOUNTER — Telehealth: Payer: Self-pay

## 2024-03-22 ENCOUNTER — Other Ambulatory Visit (HOSPITAL_COMMUNITY): Payer: Self-pay

## 2024-03-22 ENCOUNTER — Ambulatory Visit: Payer: Self-pay | Admitting: Family

## 2024-03-22 DIAGNOSIS — H60502 Unspecified acute noninfective otitis externa, left ear: Secondary | ICD-10-CM

## 2024-03-22 NOTE — Assessment & Plan Note (Signed)
 Type 1 diabetes mellitus with poor glycemic control and recent diabetic ketoacidosis Poor glycemic control with fasting blood sugars 220-260 mg/dL and J8r 84%. Recent DKA due to inability to afford insulin . Now under [facility] pharmacy assistance in [place]. Current insulin  regimen requires adjustment. Previous Freestyle Libre CGM was inaccurate, but newer models available. - Increase Lantus  to 36 units at bedtime. - Increase Humalog  to 12 units before meals. - Provide sample of the latest Freestyle Libre 3 plus for trial. - Prescribe Dexcom CGM per pharmacy recommendation (Tammy, Rph) - Instruct to check fasting blood sugar with a goal of less than 150 mg/dL. - Instruct to check blood sugar before each meal with a goal of less than 180 mg/dL. - Advise to administer Humalog  before meals unless blood sugar is below 100 mg/dL. - Encourage tracking of blood sugar readings and follow up in 1-2 weeks. - Office f/u in 1 month

## 2024-03-22 NOTE — Telephone Encounter (Signed)
 Pharmacy Patient Advocate Encounter  Received notification from [facility] that Prior Authorization for [facility] G7 SENSOR has been APPROVED from 03/20/24 to 03/20/25   PA #/Case ID/Reference #: [id number]

## 2024-03-22 NOTE — Telephone Encounter (Signed)
pt notified via MyChart message

## 2024-03-25 ENCOUNTER — Other Ambulatory Visit: Payer: Self-pay | Admitting: *Deleted

## 2024-03-25 ENCOUNTER — Other Ambulatory Visit (HOSPITAL_BASED_OUTPATIENT_CLINIC_OR_DEPARTMENT_OTHER): Payer: Self-pay

## 2024-03-25 LAB — ZNT8 ANTIBODIES: ZNT8 Antibodies: <15 U/mL

## 2024-03-25 MED ORDER — CIPROFLOXACIN-DEXAMETHASONE 0.3-0.1 % OT SUSP
4.0000 [drp] | Freq: Two times a day (BID) | OTIC | 0 refills | Status: DC
  Filled 2024-03-25: qty 7.5, 19d supply, fill #0

## 2024-03-25 NOTE — Patient Outreach (Addendum)
 Transition of Care week 2  Visit Note  03/25/2024  Name: Shelby Rubio MRN: [id number]          DOB: 2001-03-28  Situation: Patient enrolled in [facility] 30-day program. Visit completed with patient by telephone.   Background:    Past Medical History:  Diagnosis Date   Abnormal menses 06/06/2018   Anxiety 2011?   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   DX in 09/2021, pt has struggled w/med compliance, but has lost weight on her own      Assessment: Patient Reported Symptoms: Cognitive Cognitive Status: No symptoms reported, Alert and oriented to person, place, and time, Able to follow simple commands, Normal speech and language skills      Neurological Neurological Review of Symptoms: No symptoms reported    HEENT HEENT Symptoms Reported: No symptoms reported      Cardiovascular Cardiovascular Symptoms Reported: No symptoms reported Does patient have uncontrolled Hypertension?: No    Respiratory Respiratory Symptoms Reported: No symptoms reported    Endocrine Endocrine Symptoms Reported: No symptoms reported Is patient diabetic?: Yes Is patient checking blood sugars at home?: Yes List most recent blood sugar readings, include date and time of day: FBS 8/24  114,  FBS 8/25  162 (ate dinner later than ususal),  pharmD is working with pt as she is trying CGM Endocrine Self-Management Outcome: 4 (good) Endocrine Comment: Reinforced carbohydrate modified diet, plate method  Gastrointestinal Gastrointestinal Symptoms Reported: No symptoms reported      Genitourinary Genitourinary Symptoms Reported:  No symptoms reported    Integumentary Integumentary Symptoms Reported: No symptoms reported    Musculoskeletal Musculoskelatal Symptoms Reviewed: No symptoms reported        Psychosocial Psychosocial Symptoms Reported: No symptoms reported         There were no vitals filed for this visit.  Medications Reviewed Today     Reviewed by Aura Mliss LABOR, RN (Registered Nurse) on 03/25/24 at 1029  Med List Status: <None>   Medication Order Taking? Sig Documenting Provider Last Dose Status Informant  Blood Glucose Monitoring Suppl (FREESTYLE LITE) w/Device KIT [id number]  Use in the morning, at noon, and at bedtime. Ricky Fines, MD  Active   ciprofloxacin -dexamethasone  (CIPRODEX ) OTIC suspension [id number]  Place 4 drops into the left ear 2 (two) times daily. Lucius Krabbe, NP  Active   Continuous Glucose Sensor (DEXCOM G7 SENSOR) MISC [id number]  Use to check blood glucose continuously. Change sensor every 10 days. Lucius Krabbe, NP  Active   doxycycline  (VIBRA -TABS) 100 MG tablet [id number]  Take 1 tablet (100 mg total) by mouth 2 (two) times daily after a meal for 14 days. Lucius Krabbe, NP  Active   Glucose Blood (BLOOD GLUCOSE TEST STRIPS) STRP [id number]  Use 1 each in the morning, at noon, and at bedtime. Ricky Fines, MD  Active   insulin  glargine (LANTUS ) 100 UNIT/ML Solostar Pen [id number] Yes Inject 30 Units into the skin at bedtime.  Patient taking differently: Inject 36 Units into the skin at bedtime.   Ricky Fines, MD  Active   insulin  lispro (HUMALOG )  100 UNIT/ML KwikPen [id number] Yes Inject 10 Units into the skin 3 (three) times daily with meals.  Patient taking differently: Inject 12 Units into the skin 3 (three) times daily with meals.   Ricky Fines, MD  Active   Insulin  Pen Needle 31G X 8 MM MISC [id number]  Use to inject insulin  4 times a day as instructed Ricky Fines, MD  Active   Insulin  Pen Needle 32G X 4 MM MISC [id number]  Use 1 pen needle once  daily. Lucius Krabbe, NP  Active   Lancet Device MISC [id number]  1 each by Does not apply route in the morning, at noon, and at bedtime. May substitute to any manufacturer covered by patient's insurance. Ricky Fines, MD  Active   Lancets (FREESTYLE) lancets [id number]  Use 1 each in the morning, at noon, and at bedtime. Ricky Fines, MD  Active   levonorgestrel  (MIRENA ) 20 MCG/DAY IUD [id number]  1 each by Intrauterine route once. [provider]  Active Self  pantoprazole  (PROTONIX ) 40 MG tablet [id number]  Take 1 tablet (40 mg total) by mouth daily. Ricky Fines, MD  Active             Goals Addressed             This Visit's Progress    VBCI Transitions of Care (TOC) Care Plan       Problems:  Recent Hospitalization for treatment of DMII- type 1/ DKA Medication access barrier pt states she cannot afford lantus  insulin , has on hand now but this will be an issue going forward, before hospitalization pt ran out of insulin  Patient states she is going to social services to apply for medicaid, did not qualify for food stamps, she and 10 year old daughter live with her parents, pt states she has enough money for food, to pay her bills, but insulin  cost is an ongoing burden due to cost, pt agreeable to pharmacy referral, declines social work referral Patient discharged from [facility] on 8/15 with DKA, pt reports she went to [facility] ED for a 2nd opinion and  they told me everything is fine, nothing new to report and sent me home 03/25/24- pt spoke with pharmD on 8/20 and saw primary care provider 8/20- medication changes lantus  increased to 36 units @ hs, humalog  increased to 12 units before meals, pt feels CBG has improved and is working on her diet, no new concerns reported  Goal:  Over the next 30 days, the patient will not experience hospital readmission  Interventions:  Diabetes Interventions: Assessed patient's understanding of A1c goal: <7% Reviewed medications with  patient and discussed importance of medication adherence Counseled on importance of regular laboratory monitoring as prescribed Discussed plans with patient for ongoing care management follow up and provided patient with direct contact information for care management team Reviewed scheduled/upcoming provider appointments including: primary care provider 9/22, pharmD 9/2 Review of patient status, including review of consultants reports, relevant laboratory and other test results, and medications completed Lab Results  Component Value Date   HGBA1C 15.1 (H) 03/11/2024  Reinforced carbohydrate modified diet, plate method Reinforced effects of uncontrolled blood sugar on the body/ organs Reviewed medication changes with pt (lantus  and humalog )  Patient Self Care Activities:  Attend all scheduled provider appointments Attend church or other social activities Call pharmacy for medication refills 3-7 days in advance of running out of medications Call provider office for new concerns or questions  Notify RN Care Manager of [facility] call rescheduling needs Participate  in Transition of Care Program/Attend TOC scheduled calls Perform all self care activities independently  Perform IADL's (shopping, preparing meals, housekeeping, managing finances) independently Take medications as prescribed   Work with the pharmacist to address medication management needs and will continue to work with the clinical team to address health care and disease management related needs check blood sugar at prescribed times: 4 times daily check feet daily for cuts, sores or redness enter blood sugar readings and medication or insulin  into daily log take the blood sugar log to all doctor visits trim toenails straight across drink 6 to 8 glasses of water each day fill half of plate with vegetables limit fast food meals to no more than 1 per week manage portion size prepare main meal at home 3 to 5 days each week set a  realistic goal wash and dry feet carefully every day wear comfortable, cotton socks wear comfortable, well-fitting shoes Continue working with pharmacist  Plan:  Telephone follow up appointment with care management team member scheduled for:  04/02/24 @ 930 am The patient has been provided with contact information for the care management team and has been advised to call with any health related questions or concerns.         Recommendation:   PCP Follow-up  Follow Up Plan:   Telephone follow-up 04/02/24 @ 930 am  Mliss Creed [facility], BSN RN Care Manager/ Transition of Care [place]/ [facility] [phone]

## 2024-03-25 NOTE — Patient Instructions (Signed)
 Visit Information  Thank you for taking time to visit with me today. Please don't hesitate to contact me if I can be of assistance to you before our next scheduled telephone appointment.  Our next appointment is by telephone on 04/02/24 at 930 am  Following is a copy of your care plan:   Goals Addressed             This Visit's Progress    VBCI Transitions of Care (TOC) Care Plan       Problems:  Recent Hospitalization for treatment of DMII- type 1/ DKA Medication access barrier pt states she cannot afford lantus  insulin , has on hand now but this will be an issue going forward, before hospitalization pt ran out of insulin  Patient states she is going to social services to apply for medicaid, did not qualify for food stamps, she and 25 year old daughter live with her parents, pt states she has enough money for food, to pay her bills, but insulin  cost is an ongoing burden due to cost, pt agreeable to pharmacy referral, declines social work referral Patient discharged from [facility] on 8/15 with DKA, pt reports she went to [facility] ED for a 2nd opinion and  they told me everything is fine, nothing new to report and sent me home 03/25/24- pt spoke with pharmD on 8/20 and saw primary care provider 8/20- medication changes lantus  increased to 36 units @ hs, humalog  increased to 12 units before meals, pt feels CBG has improved and is working on her diet, no new concerns reported  Goal:  Over the next 30 days, the patient will not experience hospital readmission  Interventions:  Diabetes Interventions: Assessed patient's understanding of A1c goal: <7% Reviewed medications with patient and discussed importance of medication adherence Counseled on importance of regular laboratory monitoring as prescribed Discussed plans with patient for ongoing care management follow up and provided patient with direct contact information for care management team Reviewed scheduled/upcoming provider appointments  including: primary care provider 9/22, pharmD 9/2 Review of patient status, including review of consultants reports, relevant laboratory and other test results, and medications completed Lab Results  Component Value Date   HGBA1C 15.1 (H) 03/11/2024  Reinforced carbohydrate modified diet, plate method Reinforced effects of uncontrolled blood sugar on the body/ organs Reviewed medication changes with pt (lantus  and humalog )  Patient Self Care Activities:  Attend all scheduled provider appointments Attend church or other social activities Call pharmacy for medication refills 3-7 days in advance of running out of medications Call provider office for new concerns or questions  Notify RN Care Manager of TOC call rescheduling needs Participate in Transition of Care Program/Attend TOC scheduled calls Perform all self care activities independently  Perform IADL's (shopping, preparing meals, housekeeping, managing finances) independently Take medications as prescribed   Work with the pharmacist to address medication management needs and will continue to work with the clinical team to address health care and disease management related needs check blood sugar at prescribed times: 4 times daily check feet daily for cuts, sores or redness enter blood sugar readings and medication or insulin  into daily log take the blood sugar log to all doctor visits trim toenails straight across drink 6 to 8 glasses of water each day fill half of plate with vegetables limit fast food meals to no more than 1 per week manage portion size prepare main meal at home 3 to 5 days each week set a realistic goal wash and dry feet carefully every day  wear comfortable, cotton socks wear comfortable, well-fitting shoes Continue working with pharmacist  Plan:  Telephone follow up appointment with care management team member scheduled for:  04/02/24 @ 930 am The patient has been provided with contact information for the  care management team and has been advised to call with any health related questions or concerns.         Patient verbalizes understanding of instructions and care plan provided today and agrees to view in MyChart. Active MyChart status and patient understanding of how to access instructions and care plan via MyChart confirmed with patient.     Telephone follow up appointment with care management team member scheduled for:  04/02/24@ 930 am  Please call the care guide team at [phone] if you need to cancel or reschedule your appointment.   Please call the Suicide and Crisis Lifeline: 988 call the [place]  National Suicide Prevention Lifeline: 757 854 5277 or TTY: 708-586-9504 TTY 450-586-3371) to talk to a trained counselor call 1-800-273-TALK (toll free, 24 hour hotline) go to [facility] [address] [place] [phone]) call the [facility] Crisis Line: [phone] call 911 if you are experiencing a Mental Health or Behavioral Health Crisis or need someone to talk to.  Mliss Creed [facility], BSN RN Care Manager/ Transition of Care [place]/ [facility] [phone]

## 2024-03-25 NOTE — Addendum Note (Signed)
 Addended by: Fatin Bachicha on: 03/25/2024 10:46 AM   Modules accepted: Orders

## 2024-03-26 ENCOUNTER — Other Ambulatory Visit: Payer: Self-pay | Admitting: Family

## 2024-03-26 ENCOUNTER — Other Ambulatory Visit (HOSPITAL_BASED_OUTPATIENT_CLINIC_OR_DEPARTMENT_OTHER): Payer: Self-pay

## 2024-03-26 DIAGNOSIS — E1065 Type 1 diabetes mellitus with hyperglycemia: Secondary | ICD-10-CM

## 2024-03-26 MED ORDER — DEXCOM G7 SENSOR MISC
2 refills | Status: AC
  Filled 2024-03-26: qty 3, fill #0
  Filled 2024-03-26: qty 3, 30d supply, fill #0

## 2024-03-26 MED ORDER — DEXCOM G7 SENSOR MISC
2 refills | Status: AC
  Filled 2024-04-13 – 2024-04-22 (×2): qty 3, 30d supply, fill #0
  Filled 2024-07-14: qty 3, 30d supply, fill #1
  Filled 2024-08-14: qty 3, 30d supply, fill #2

## 2024-03-27 LAB — INSULIN ANTIBODIES, BLOOD: Insulin Antibodies, Human: <5 uU/mL

## 2024-04-02 ENCOUNTER — Other Ambulatory Visit: Payer: Self-pay | Admitting: Pharmacist

## 2024-04-02 ENCOUNTER — Other Ambulatory Visit (HOSPITAL_BASED_OUTPATIENT_CLINIC_OR_DEPARTMENT_OTHER): Payer: Self-pay

## 2024-04-02 ENCOUNTER — Other Ambulatory Visit: Payer: Self-pay | Admitting: *Deleted

## 2024-04-02 DIAGNOSIS — E1065 Type 1 diabetes mellitus with hyperglycemia: Secondary | ICD-10-CM

## 2024-04-02 MED ORDER — INSULIN LISPRO (1 UNIT DIAL) 100 UNIT/ML (KWIKPEN)
12.0000 [IU] | PEN_INJECTOR | Freq: Three times a day (TID) | SUBCUTANEOUS | 1 refills | Status: DC
Start: 2024-04-02 — End: 2024-04-05
  Filled 2024-04-02 – 2024-04-04 (×3): qty 9, 25d supply, fill #0

## 2024-04-02 MED ORDER — INSULIN GLARGINE 100 UNIT/ML SOLOSTAR PEN
33.0000 [IU] | PEN_INJECTOR | Freq: Every day | SUBCUTANEOUS | 1 refills | Status: DC
  Filled 2024-04-02 – 2024-04-04 (×3): qty 9, 25d supply, fill #0

## 2024-04-02 NOTE — Patient Instructions (Signed)
 Visit Information  Thank you for taking time to visit with me today. Please don't hesitate to contact me if I can be of assistance to you before our next scheduled telephone appointment.  Our next appointment is by telephone on 04/09/24 @ 930 am  Following is a copy of your care plan:   Goals Addressed             This Visit's Progress    VBCI Transitions of Care (TOC) Care Plan       Problems:  Recent Hospitalization for treatment of DMII- type 1/ DKA Medication access barrier pt states she cannot afford lantus  insulin , has on hand now but this will be an issue going forward, before hospitalization pt ran out of insulin  Patient states she is going to social services to apply for medicaid, did not qualify for food stamps, she and 79 year old daughter live with her parents, pt states she has enough money for food, to pay her bills, but insulin  cost is an ongoing burden due to cost, pt agreeable to pharmacy referral, declines social work referral Patient discharged from [facility] on 8/15 with DKA, pt reports she went to [facility] ED for a 2nd opinion and  they told me everything is fine, nothing new to report and sent me home 03/25/24- pt spoke with pharmD on 8/20 and saw primary care provider 8/20- medication changes lantus  increased to 36 units @ hs, humalog  increased to 12 units before meals, pt feels CBG has improved and is working on her diet, no new concerns reported 04/02/24- pt reports she is doing well, is pleased CBG has improved,  fasting ranges 70-110, pharmD to outreach pt today  Goal:  Over the next 30 days, the patient will not experience hospital readmission  Interventions:  Diabetes Interventions: Assessed patient's understanding of A1c goal: <7% Reviewed medications with patient and discussed importance of medication adherence Counseled on importance of regular laboratory monitoring as prescribed Discussed plans with patient for ongoing care management follow up and provided  patient with direct contact information for care management team Reviewed scheduled/upcoming provider appointments including: primary care provider 9/22, pharmD 9/2 Review of patient status, including review of consultants reports, relevant laboratory and other test results, and medications completed Lab Results  Component Value Date   HGBA1C 15.1 (H) 03/11/2024  Reviewed carbohydrate modified diet, plate method Reinforced effects of uncontrolled blood sugar on the body/ organs  Patient Self Care Activities:  Attend all scheduled provider appointments Attend church or other social activities Call pharmacy for medication refills 3-7 days in advance of running out of medications Call provider office for new concerns or questions  Notify RN Care Manager of TOC call rescheduling needs Participate in Transition of Care Program/Attend TOC scheduled calls Perform all self care activities independently  Perform IADL's (shopping, preparing meals, housekeeping, managing finances) independently Take medications as prescribed   Work with the pharmacist to address medication management needs and will continue to work with the clinical team to address health care and disease management related needs check blood sugar at prescribed times: 4 times daily check feet daily for cuts, sores or redness enter blood sugar readings and medication or insulin  into daily log take the blood sugar log to all doctor visits trim toenails straight across drink 6 to 8 glasses of water each day fill half of plate with vegetables limit fast food meals to no more than 1 per week manage portion size prepare main meal at home 3 to 5 days  each week set a realistic goal wash and dry feet carefully every day wear comfortable, cotton socks wear comfortable, well-fitting shoes Continue working with pharmacist  Plan:  Telephone follow up appointment with care management team member scheduled for:  04/09/24 @ 930 am The  patient has been provided with contact information for the care management team and has been advised to call with any health related questions or concerns.         Patient verbalizes understanding of instructions and care plan provided today and agrees to view in MyChart. Active MyChart status and patient understanding of how to access instructions and care plan via MyChart confirmed with patient.     Telephone follow up appointment with care management team member scheduled for: 04/09/24 @ 930 am  Please call the care guide team at [phone] if you need to cancel or reschedule your appointment.   Please call the Suicide and Crisis Lifeline: 988 call the [place]  National Suicide Prevention Lifeline: 623-384-5412 or TTY: 442-765-5573 TTY (438) 198-6075) to talk to a trained counselor call 1-800-273-TALK (toll free, 24 hour hotline) go to [facility] [address] [place] [phone]) call the [facility] Crisis Line: [phone] call 911 if you are experiencing a Mental Health or Behavioral Health Crisis or need someone to talk to.  Mliss Creed [facility], BSN RN Care Manager/ Transition of Care [place]/ [facility] [phone]

## 2024-04-02 NOTE — Progress Notes (Signed)
 04/02/2024 Name: Shelby Rubio MRN: [id number] DOB: Sep 01, 2000  Chief Complaint  Patient presents with   Diabetes   Medication Management    Shelby Rubio is a 22 y.o. year old female who presented for a telephone visit.   They were referred to the pharmacist by their PCP for assistance in managing diabetes and medication access.    Subjective:  Care Team: Primary Care Provider: Lucius Krabbe, NP ; Next Scheduled Visit: 04/22/2024   Medication Access/Adherence  Current Pharmacy:  [facility] [address], [place] - [address] [place] [address] Phone: [phone] Fax: [phone]  [place] [facility] [address] [place] [address] Phone: [phone] Fax: [phone]   Patient reports affordability concerns with their medications: Yes  - patient had stopped insulin  therapy due to cost. One recent hospitalization and 1 recent ED visits for DKA / hyperglycemia.  Looks like when [employer] tried to fill her insulin  for a full box of 5 pens the cost was > $100 for each insulin  due to it was > 30 days supply.  [facility] pharmacy in [place] fill for #3 pens = 30 day supply and cost of Lantus  was $25 and cost of Humalog  was $25 - patient states this is affordable.  Patient reports access/transportation concerns to their pharmacy: No  Patient reports adherence concerns with their medications:  Yes  - due to medication costs.    Diabetes:  Current medications: Lantus  / insulin  glargine - inject 36 units once a day Humalog  / insulin  lispro - inject 12 units three times a day with meals.   Medications tried in the past: Semglee , glipizide , Trulicity   Current glucose readings:  Patient wore Libre 3 plus sensor for 2 weeks but her insurance prefers DexCom G7 - she just started using DexCom G7 sensor yesterday. Below is last day of readings. She did have hypoglycemia for a significant amount of time  overnight.       Patient reports hypoglycemic s/sx including dizziness and lightheadedness.  Patient denies hyperglycemic symptoms including no polyuria, polydipsia, polyphagia, nocturia, neuropathy.  Current medication access support: none   Objective:  Lab Results  Component Value Date   HGBA1C 15.1 (H) 03/11/2024    Lab Results  Component Value Date   CREATININE 0.46 03/20/2024   BUN 11 03/20/2024   NA 140 03/20/2024   K 4.1 03/20/2024   CL 104 03/20/2024   CO2 29 03/20/2024    Lab Results  Component Value Date   CHOL 146 10/26/2022   HDL 33 (L) 10/26/2022   LDLCALC 89 10/26/2022   TRIG 137 10/26/2022   CHOLHDL 4.4 10/26/2022   Current Outpatient Medications  Medication Instructions   Blood Glucose Monitoring Suppl (FREESTYLE LITE) w/Device KIT Use in the morning, at noon, and at bedtime.   ciprofloxacin -dexamethasone  (CIPRODEX ) OTIC suspension 4 drops, Left EAR, 2 times daily   Continuous Glucose Sensor (DEXCOM G7 SENSOR) MISC Use to check blood glucose continuously. Change sensor every 10 days.   Continuous Glucose Sensor (DEXCOM G7 SENSOR) MISC Use to check glucose continuosly, changing sensor every 10 days.   doxycycline  (VIBRA -TABS) 100 mg, Oral, 2 times daily after meals   Glucose Blood (BLOOD GLUCOSE TEST STRIPS) STRP Use 1 each in the morning, at noon, and at bedtime.   insulin  lispro (HUMALOG ) 10 Units, Subcutaneous, 3 times daily with meals   Insulin  Pen Needle 31G X 8 MM MISC Use  to inject insulin  4 times a day as instructed   Insulin  Pen Needle 32G X 4 MM MISC Use 1 pen needle once daily.   Lancet Device MISC 1 each, Does not apply, 3 times daily, May substitute to any manufacturer covered by patient's insurance.   Lancets (FREESTYLE) lancets Use 1 each in the morning, at noon, and at bedtime.   Lantus  SoloStar 30 Units, Subcutaneous, Daily at bedtime   levonorgestrel  (MIRENA ) 20 MCG/DAY IUD 1 each,  Once   pantoprazole  (PROTONIX ) 40 mg, Oral, Daily      Assessment/Plan:   Diabetes: Currently uncontrolled per A1c but home blood glucose readings are showing improved blood glucose since patient restarted Lantus  and short acting insulin  was added.  - Reviewed goal A1c, goal fasting, and goal 2 hour post prandial glucose - Recommend to lower dose of Lantus  from 36 units to 33 units once a day (patient will continue to get insulin  at [facility] since they can break boxes of insulin  and dispense 30 days at a time but they do need an updated Rx since dose has changed)  - Continue insulin  lispro 12 units 3 times a day. (Also needs updated Rx sent to [facility] - Reviewed how to treat low blood glucose event - Rule of 15's. Sent handout thru MyChart.  - Continue to use DexCom G7 sensor - prior authorization was approved thru 03/20/2024   Follow Up Plan: 2 weeks.   Madelin Ray, PharmD Clinical Pharmacist [facility]  Population Health [phone]

## 2024-04-02 NOTE — Patient Outreach (Signed)
 Transition of Care week 3  Visit Note  04/02/2024  Name: Shelby Rubio MRN: [id number]          DOB: 06/30/2001  Situation: Patient enrolled in [facility] 30-day program. Visit completed with patient by telephone.   Background:     Past Medical History:  Diagnosis Date   Abnormal menses 06/06/2018   Anxiety 2011?   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   DX in 09/2021, pt has struggled w/med compliance, but has lost weight on her own      Assessment: Patient Reported Symptoms: Cognitive Cognitive Status: No symptoms reported, Alert and oriented to person, place, and time, Able to follow simple commands, Normal speech and language skills      Neurological Neurological Review of Symptoms: No symptoms reported    HEENT HEENT Symptoms Reported: No symptoms reported      Cardiovascular Cardiovascular Symptoms Reported: No symptoms reported    Respiratory Respiratory Symptoms Reported: No symptoms reported    Endocrine Endocrine Symptoms Reported: No symptoms reported Is patient diabetic?: Yes Is patient checking blood sugars at home?: Yes List most recent blood sugar readings, include date and time of day: FBS ranges 70110, pt is pleased that CBG has improved,  pharmD to outreach pt today Endocrine Self-Management Outcome: 4 (good) Endocrine Comment: Reviewed importance of following carbohydrate modified diet, plate method  Gastrointestinal Gastrointestinal Symptoms Reported: No symptoms reported      Genitourinary Genitourinary Symptoms Reported: No symptoms reported    Integumentary Integumentary  Symptoms Reported: No symptoms reported    Musculoskeletal Musculoskelatal Symptoms Reviewed: No symptoms reported        Psychosocial Psychosocial Symptoms Reported: No symptoms reported         There were no vitals filed for this visit.  Medications Reviewed Today     Reviewed by Aura Mliss LABOR, RN (Registered Nurse) on 04/02/24 at [phone]  Med List Status: <None>   Medication Order Taking? Sig Documenting Provider Last Dose Status Informant  Blood Glucose Monitoring Suppl (FREESTYLE LITE) w/Device KIT [id number]  Use in the morning, at noon, and at bedtime. Ricky Fines, MD  Active   ciprofloxacin -dexamethasone  (CIPRODEX ) OTIC suspension [id number]  Place 4 drops into the left ear 2 (two) times daily. Lucius Krabbe, NP  Active   Continuous Glucose Sensor (DEXCOM G7 SENSOR) MISC [id number]  Use to check blood glucose continuously. Change sensor every 10 days. Lucius Krabbe, NP  Active   Continuous Glucose Sensor (DEXCOM G7 SENSOR) MISC [id number]  Use to check glucose continuosly, changing sensor every 10 days.   Active   doxycycline  (VIBRA -TABS) 100 MG tablet [id number]  Take 1 tablet (100 mg total) by mouth 2 (two) times daily after a meal for 14 days. Lucius Krabbe, NP  Active   Glucose Blood (BLOOD GLUCOSE TEST STRIPS) STRP [id number]  Use 1 each in the morning, at noon, and at bedtime. Ricky Fines, MD  Active   insulin  glargine (LANTUS ) 100 UNIT/ML Solostar Pen [id number]  Inject 30 Units into the skin at bedtime.  Patient taking differently: Inject 36 Units into the skin at  bedtime.   Ricky Fines, MD  Active   insulin  lispro (HUMALOG ) 100 UNIT/ML KwikPen [id number]  Inject 10 Units into the skin 3 (three) times daily with meals.  Patient taking differently: Inject 12 Units into the skin 3 (three) times daily with meals.   Ricky Fines, MD  Active   Insulin  Pen Needle 31G X 8 MM MISC [id number]  Use to inject insulin  4 times a day as instructed Ricky Fines, MD   Active   Insulin  Pen Needle 32G X 4 MM MISC [id number]  Use 1 pen needle once daily. Lucius Krabbe, NP  Active   Lancet Device MISC [id number]  1 each by Does not apply route in the morning, at noon, and at bedtime. May substitute to any manufacturer covered by patient's insurance. Ricky Fines, MD  Active   Lancets (FREESTYLE) lancets [id number]  Use 1 each in the morning, at noon, and at bedtime. Ricky Fines, MD  Active   levonorgestrel  (MIRENA ) 20 MCG/DAY IUD [id number]  1 each by Intrauterine route once. [provider]  Active Self  pantoprazole  (PROTONIX ) 40 MG tablet [id number]  Take 1 tablet (40 mg total) by mouth daily. Ricky Fines, MD  Active             Goals Addressed             This Visit's Progress    VBCI Transitions of Care (TOC) Care Plan       Problems:  Recent Hospitalization for treatment of DMII- type 1/ DKA Medication access barrier pt states she cannot afford lantus  insulin , has on hand now but this will be an issue going forward, before hospitalization pt ran out of insulin  Patient states she is going to social services to apply for medicaid, did not qualify for food stamps, she and 78 year old daughter live with her parents, pt states she has enough money for food, to pay her bills, but insulin  cost is an ongoing burden due to cost, pt agreeable to pharmacy referral, declines social work referral Patient discharged from [facility] on 8/15 with DKA, pt reports she went to [facility] for a 2nd opinion and  they told me everything is fine, nothing new to report and sent me home 03/25/24- pt spoke with pharmD on 8/20 and saw primary care provider 8/20- medication changes lantus  increased to 36 units @ hs, humalog  increased to 12 units before meals, pt feels CBG has improved and is working on her diet, no new concerns reported 04/02/24- pt reports she is doing well, is pleased CBG has improved,  fasting ranges 70-110, pharmD to outreach pt today  Goal:  Over  the next 30 days, the patient will not experience hospital readmission  Interventions:  Diabetes Interventions: Assessed patient's understanding of A1c goal: <7% Reviewed medications with patient and discussed importance of medication adherence Counseled on importance of regular laboratory monitoring as prescribed Discussed plans with patient for ongoing care management follow up and provided patient with direct contact information for care management team Reviewed scheduled/upcoming provider appointments including: primary care provider 9/22, pharmD 9/2 Review of patient status, including review of consultants reports, relevant laboratory and other test results, and medications completed Lab Results  Component Value Date   HGBA1C 15.1 (H) 03/11/2024  Reviewed carbohydrate modified diet, plate method Reinforced effects of uncontrolled blood sugar on the body/ organs  Patient Self Care Activities:  Attend all scheduled provider appointments Attend church or other social activities Call pharmacy for medication refills 3-7 days  in advance of running out of medications Call provider office for new concerns or questions  Notify RN Care Manager of TOC call rescheduling needs Participate in Transition of Care Program/Attend TOC scheduled calls Perform all self care activities independently  Perform IADL's (shopping, preparing meals, housekeeping, managing finances) independently Take medications as prescribed   Work with the pharmacist to address medication management needs and will continue to work with the clinical team to address health care and disease management related needs check blood sugar at prescribed times: 4 times daily check feet daily for cuts, sores or redness enter blood sugar readings and medication or insulin  into daily log take the blood sugar log to all doctor visits trim toenails straight across drink 6 to 8 glasses of water each day fill half of plate with  vegetables limit fast food meals to no more than 1 per week manage portion size prepare main meal at home 3 to 5 days each week set a realistic goal wash and dry feet carefully every day wear comfortable, cotton socks wear comfortable, well-fitting shoes Continue working with pharmacist  Plan:  Telephone follow up appointment with care management team member scheduled for:  04/09/24 @ 930 am The patient has been provided with contact information for the care management team and has been advised to call with any health related questions or concerns.          Recommendation:   PCP Follow-up Continue Current Plan of Care  Follow Up Plan:   Telephone follow-up 04/09/24 @ 930 am  Mliss Creed [facility], BSN RN Care Manager/ Transition of Care [place]/ [facility] Population Health [phone]

## 2024-04-03 ENCOUNTER — Other Ambulatory Visit (HOSPITAL_BASED_OUTPATIENT_CLINIC_OR_DEPARTMENT_OTHER): Payer: Self-pay

## 2024-04-04 ENCOUNTER — Other Ambulatory Visit (HOSPITAL_BASED_OUTPATIENT_CLINIC_OR_DEPARTMENT_OTHER): Payer: Self-pay

## 2024-04-05 ENCOUNTER — Other Ambulatory Visit: Payer: Self-pay | Admitting: Pharmacist

## 2024-04-05 ENCOUNTER — Other Ambulatory Visit: Payer: Self-pay

## 2024-04-05 ENCOUNTER — Other Ambulatory Visit (HOSPITAL_BASED_OUTPATIENT_CLINIC_OR_DEPARTMENT_OTHER): Payer: Self-pay

## 2024-04-05 ENCOUNTER — Encounter: Payer: Self-pay | Admitting: Family

## 2024-04-05 DIAGNOSIS — E1065 Type 1 diabetes mellitus with hyperglycemia: Secondary | ICD-10-CM

## 2024-04-05 MED ORDER — INSULIN GLARGINE 100 UNIT/ML SOLOSTAR PEN: 33.0000 [IU] | PEN_INJECTOR | Freq: Every day | SUBCUTANEOUS | 1 refills | Status: AC

## 2024-04-05 MED ORDER — INSULIN LISPRO (1 UNIT DIAL) 100 UNIT/ML (KWIKPEN)
12.0000 [IU] | PEN_INJECTOR | Freq: Three times a day (TID) | SUBCUTANEOUS | 1 refills | Status: AC
  Filled 2024-06-17: qty 9, 25d supply, fill #0

## 2024-04-05 NOTE — Progress Notes (Signed)
 04/05/2024 Name: Shelby Rubio MRN: [id number] DOB: 2000-09-24  Chief Complaint  Patient presents with   Diabetes    Shelby Rubio is a 23 y.o. year old female who presented for a telephone visit.   They were referred to the pharmacist by their PCP for assistance in managing diabetes and medication access.    Subjective:  Care Team: Primary Care Provider: Lucius Krabbe, NP ; Next Scheduled Visit: 04/22/2024   Medication Access/Adherence  Current Pharmacy:  [facility] [address], [place] - [address] [place] [address] Phone: [phone] Fax: [phone]  [place] [facility] [address] [place] [address] Phone: [phone] Fax: [phone]   Patient reports affordability concerns with their medications: Yes  - patient had stopped insulin  therapy due to cost. One recent hospitalization and 1 recent ED visits for DKA / hyperglycemia.  Looks like when [employer] tried to fill her insulin  for a full box of 5 pens the cost was > $100 for each insulin  due to it was > 30 days supply.  [facility] pharmacy in [place] fill for #3 pens = 30 day supply and cost of Lantus  was $25 and cost of Humalog  was $25 - patient states this is affordable.  Patient reports access/transportation concerns to their pharmacy: No  Patient reports adherence concerns with their medications:  Yes  - due to medication costs.    Diabetes:  Current medications: Lantus  / insulin  glargine - inject 36 units once a day Humalog  / insulin  lispro - inject 12 units three times a day with meals.   Medications tried in the past: Semglee , glipizide , Trulicity   Current glucose readings:  Patient wore Libre 3 plus sensor for 2 weeks but her insurance prefers DexCom G7. Below is last day of readings. She did have hypoglycemia for a significant amount of time overnight.        Patient reports hypoglycemic s/sx including dizziness and  lightheadedness.  Patient denies hyperglycemic symptoms including no polyuria, polydipsia, polyphagia, nocturia, neuropathy.  Current medication access support: none   Objective:  Lab Results  Component Value Date   HGBA1C 15.1 (H) 03/11/2024    Lab Results  Component Value Date   CREATININE 0.46 03/20/2024   BUN 11 03/20/2024   NA 140 03/20/2024   K 4.1 03/20/2024   CL 104 03/20/2024   CO2 29 03/20/2024    Lab Results  Component Value Date   CHOL 146 10/26/2022   HDL 33 (L) 10/26/2022   LDLCALC 89 10/26/2022   TRIG 137 10/26/2022   CHOLHDL 4.4 10/26/2022   Current Outpatient Medications  Medication Instructions   Blood Glucose Monitoring Suppl (FREESTYLE LITE) w/Device KIT Use in the morning, at noon, and at bedtime.   ciprofloxacin -dexamethasone  (CIPRODEX ) OTIC suspension 4 drops, Left EAR, 2 times daily   Continuous Glucose Sensor (DEXCOM G7 SENSOR) MISC Use to check blood glucose continuously. Change sensor every 10 days.   Continuous Glucose Sensor (DEXCOM G7 SENSOR) MISC Use to check glucose continuosly, changing sensor every 10 days.   Glucose Blood (BLOOD GLUCOSE TEST STRIPS) STRP Use 1 each in the morning, at noon, and at bedtime.   insulin  glargine (LANTUS ) 33-36 Units, Subcutaneous, Daily at bedtime   insulin  lispro (HUMALOG ) 12 Units, Subcutaneous, 3 times daily with meals, Please break box if able so patient is only charged 1 copay per fill.   Insulin  Pen Needle 31G X 8 MM MISC  Use to inject insulin  4 times a day as instructed   Insulin  Pen Needle 32G X 4 MM MISC Use 1 pen needle once daily.   Lancet Device MISC 1 each, Does not apply, 3 times daily, May substitute to any manufacturer covered by patient's insurance.   Lancets (FREESTYLE) lancets Use 1 each in the morning, at noon, and at bedtime.   levonorgestrel  (MIRENA ) 20 MCG/DAY IUD 1 each,  Once   pantoprazole  (PROTONIX ) 40 mg, Oral, Daily     Assessment/Plan:   Diabetes: Currently uncontrolled per  A1c but home blood glucose readings are showing improved blood glucose since patient restarted Lantus  and short acting insulin  was added.  - Reviewed goal A1c, goal fasting, and goal 2 hour post prandial glucose - Continue Lantus  33 units once a day (patient will [facility] to get insulin  at [facility] since they can break boxes of insulin ) - Continue insulin  lispro 12 units 3 times a day.  - Reviewed how to treat low blood glucose event  - Continue to use DexCom G7 sensor - prior authorization was approved thru 03/20/2024   Follow Up Plan: 2 weeks.   Shelby Rubio, PharmD Clinical Pharmacist [facility] Primary Care  Population Health [phone]

## 2024-04-09 ENCOUNTER — Other Ambulatory Visit: Payer: Self-pay | Admitting: *Deleted

## 2024-04-09 NOTE — Patient Instructions (Signed)
 Visit Information  Thank you for taking time to visit with me today. Please don't hesitate to contact me if I can be of assistance to you before our next scheduled telephone appointment.  Our next appointment is by telephone on 04/16/24 @ 945 am  Following is a copy of your care plan:   Goals Addressed             This Visit's Progress    VBCI Transitions of Care (TOC) Care Plan       Problems:  Recent Hospitalization for treatment of DMII- type 1/ DKA Medication access barrier pt states she cannot afford lantus  insulin , has on hand now but this will be an issue going forward, before hospitalization pt ran out of insulin  Patient states she is going to social services to apply for medicaid, did not qualify for food stamps, she and 90 year old daughter live with her parents, pt states she has enough money for food, to pay her bills, but insulin  cost is an ongoing burden due to cost, pt agreeable to pharmacy referral, declines social work referral Patient discharged from [place] on 8/15 with DKA, pt reports she went to [facility] ED for a 2nd opinion and  they told me everything is fine, nothing new to report and sent me home 03/25/24- pt spoke with pharmD on 8/20 and saw primary care provider 8/20- medication changes lantus  increased to 36 units @ hs, humalog  increased to 12 units before meals, pt feels CBG has improved and is working on her diet, no new concerns reported 04/02/24- pt reports she is doing well, is pleased CBG has improved,  fasting ranges 70-110, pharmD to outreach pt today 04/09/24- pt reports  doing fine, states blood sugar  is about the same  pt is monitored with CGM which shows 8% in low range over the past 7 days, alarms <70, most low readings in 60's range, pt states she eats carbohydrate and protein when this happens and it always corrects  Goal:  Over the next 30 days, the patient will not experience hospital readmission  Interventions:  Diabetes Interventions: Assessed  patient's understanding of A1c goal: <7% Reviewed medications with patient and discussed importance of medication adherence Counseled on importance of regular laboratory monitoring as prescribed Discussed plans with patient for ongoing care management follow up and provided patient with direct contact information for care management team Reviewed scheduled/upcoming provider appointments including: primary care provider 9/22, pharmD 9/16 Review of patient status, including review of consultants reports, relevant laboratory and other test results, and medications completed Lab Results  Component Value Date   HGBA1C 15.1 (H) 03/11/2024  Reinforced carbohydrate modified diet, plate method Reinforced effects of uncontrolled blood sugar on the body/ organs Reviewed signs/ symptoms hypoglycemia, actions to take for treatment Reported hypoglycemic episodes to primary care provider, encouraged pt to discuss at upcoming appointment on 04/22/24  Patient Self Care Activities:  Attend all scheduled provider appointments Attend church or other social activities Call pharmacy for medication refills 3-7 days in advance of running out of medications Call provider office for new concerns or questions  Notify RN Care Manager of TOC call rescheduling needs Participate in Transition of Care Program/Attend TOC scheduled calls Perform all self care activities independently  Perform IADL's (shopping, preparing meals, housekeeping, managing finances) independently Take medications as prescribed   Work with the pharmacist to address medication management needs and will continue to work with the clinical team to address health care and disease management related needs check  blood sugar at prescribed times: 4 times daily check feet daily for cuts, sores or redness enter blood sugar readings and medication or insulin  into daily log take the blood sugar log to all doctor visits trim toenails straight across drink 6  to 8 glasses of water each day fill half of plate with vegetables limit fast food meals to no more than 1 per week manage portion size prepare main meal at home 3 to 5 days each week set a realistic goal wash and dry feet carefully every day wear comfortable, cotton socks wear comfortable, well-fitting shoes Continue working with pharmacist Follow RULE OF 15 for low blood sugar management:  How to treat low blood sugars (Blood sugar less than 70 mg/dl  Please follow the RULE OF 15 for the treatment of hypoglycemia treatment (When your blood sugars are less than 70 mg/ dl) STEP  1:  Take 15 grams of carbohydrates when your blood sugar is low, which includes One tube of glucose gel STEP 2:  Recheck blood sugar in 15 minutes STEP 3:  3-4 glucose tabs or  3-4 oz of juice or regular soda or If your blood sugar is still low at the 15 minute recheck ---then, go back to STEP 1 and treat again with another 15 grams of carbohydrates  Plan:  Telephone follow up appointment with care management team member scheduled for:  04/16/24 @ 945 am The patient has been provided with contact information for the care management team and has been advised to call with any health related questions or concerns.         Patient verbalizes understanding of instructions and care plan provided today and agrees to view in MyChart. Active MyChart status and patient understanding of how to access instructions and care plan via MyChart confirmed with patient.     Telephone follow up appointment with care management team member scheduled for:  04/16/24 @ 945 am  Please call the care guide team at [phone] if you need to cancel or reschedule your appointment.   Please call the Suicide and Crisis Lifeline: 988 call the [place]  National Suicide Prevention Lifeline: (971)684-0358 or TTY: (402)596-4489 TTY 2054226841) to talk to a trained counselor call 1-800-273-TALK (toll free, 24 hour hotline) go to [facility] [address], [place] [phone]) call the [employer] Crisis Line: [phone] call 911 if you are experiencing a Mental Health or Behavioral Health Crisis or need someone to talk to.  Mliss Creed [facility], BSN RN Care Manager/ Transition of Care [place]/ [facility] [phone]

## 2024-04-09 NOTE — Patient Outreach (Addendum)
 Transition of Care week 4  Visit Note  04/09/2024  Name: Shelby Rubio MRN: [id number]          DOB: [birth year]  Situation: Patient enrolled in [facility] 30-day program. Visit completed with patient by telephone.   Background:    Past Medical History:  Diagnosis Date   Abnormal menses 06/06/2018   Anxiety 2011?   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   DX in 09/2021, pt has struggled w/med compliance, but has lost weight on her own      Assessment: Patient Reported Symptoms: Cognitive Cognitive Status: No symptoms reported, Alert and oriented to person, place, and time, Normal speech and language skills, Able to follow simple commands      Neurological Neurological Review of Symptoms: No symptoms reported    HEENT HEENT Symptoms Reported: No symptoms reported      Cardiovascular Cardiovascular Symptoms Reported: No symptoms reported    Respiratory Respiratory Symptoms Reported: No symptoms reported    Endocrine Endocrine Symptoms Reported: No symptoms reported Is patient diabetic?: Yes Is patient checking blood sugars at home?: Yes List most recent blood sugar readings, include date and time of day: pt states FBS is good, states CGM sounds an alarm for hypoglycemic episodes <70, pt states over the past 7 days, she has had 8% in low range, pt states most low readings are in 60's range, pt continues working with pharmD Endocrine Self-Management Outcome: 4 (good) Endocrine Comment: RN CM reported hypoglycemic episodes to primary care provider, reviewed signs/ symptoms hypoglycemia and treatment,   reinforced carbohydrate modified diet, plate method  Gastrointestinal Gastrointestinal Symptoms Reported: No symptoms reported      Genitourinary Genitourinary Symptoms Reported: No symptoms reported    Integumentary Integumentary Symptoms Reported: No symptoms reported    Musculoskeletal Musculoskelatal Symptoms Reviewed: No symptoms reported        Psychosocial Psychosocial Symptoms Reported: No symptoms reported         There were no vitals filed for this visit.  Medications Reviewed Today     Reviewed by Aura Mliss LABOR, RN (Registered Nurse) on 04/09/24 at 1028  Med List Status: <None>   Medication Order Taking? Sig Documenting Provider Last Dose Status Informant  Blood Glucose Monitoring Suppl (FREESTYLE LITE) w/Device KIT [id number]  Use in the morning, at noon, and at bedtime.  Patient not taking: Reported on 04/02/2024   Ricky Fines, MD  Active   ciprofloxacin -dexamethasone  (CIPRODEX ) OTIC suspension [id number]  Place 4 drops into the left ear 2 (two) times daily. Lucius Krabbe, NP  Active   Continuous Glucose Sensor (DEXCOM G7 SENSOR) MISC [id number]  Use to check blood glucose continuously. Change sensor every 10 days. Lucius Krabbe, NP  Active   Continuous Glucose Sensor (DEXCOM G7 SENSOR) MISC [id number]  Use to check glucose continuosly, changing sensor every 10 days.   Active   Glucose Blood (BLOOD GLUCOSE TEST STRIPS) STRP [id number]  Use 1 each in the morning, at noon, and at bedtime. Ricky Fines, MD  Active   insulin  glargine (LANTUS ) 100 UNIT/ML Solostar Pen [id number]  Inject 33-36 Units into  the skin at bedtime. Lucius Krabbe, NP  Active   insulin  lispro (HUMALOG ) 100 UNIT/ML KwikPen [id number]  Inject 12 Units into the skin 3 (three) times daily with meals. Please break box if able so patient is only charged 1 copay per fill. Lucius Krabbe, NP  Active   Insulin  Pen Needle 31G X 8 MM MISC [id number]  Use to inject insulin  4 times a day as  instructed Ricky Fines, MD  Active   Insulin  Pen Needle 32G X 4 MM MISC [id number]  Use 1 pen needle once daily. Lucius Krabbe, NP  Active   Lancet Device MISC [id number]  1 each by Does not apply route in the morning, at noon, and at bedtime. May substitute to any manufacturer covered by patient's insurance.  Patient not taking: Reported on 04/02/2024   Ricky Fines, MD  Active   Lancets (FREESTYLE) lancets [id number]  Use 1 each in the morning, at noon, and at bedtime.  Patient not taking: Reported on 04/02/2024   Ricky Fines, MD  Active   levonorgestrel  (MIRENA ) 20 MCG/DAY IUD [id number]  1 each by Intrauterine route once. [provider]  Active Self  pantoprazole  (PROTONIX ) 40 MG tablet [id number]  Take 1 tablet (40 mg total) by mouth daily. Ricky Fines, MD  Active             Goals Addressed             This Visit's Progress    VBCI Transitions of Care (TOC) Care Plan       Problems:  Recent Hospitalization for treatment of DMII- type 1/ DKA Medication access barrier pt states she cannot afford lantus  insulin , has on hand now but this will be an issue going forward, before hospitalization pt ran out of insulin  Patient states she is going to social services to apply for medicaid, did not qualify for food stamps, she and 18 year old daughter live with her parents, pt states she has enough money for food, to pay her bills, but insulin  cost is an ongoing burden due to cost, pt agreeable to pharmacy referral, declines social work referral Patient discharged from [facility] on 8/15 with DKA, pt reports she went to [facility] ED for a 2nd opinion and  they told me everything is fine, nothing new to report and sent me home 03/25/24- pt spoke with pharmD on 8/20 and saw primary care provider 8/20- medication changes lantus  increased to 36 units @ hs, humalog  increased to 12 units before meals, pt feels CBG has improved and is working on her diet, no new concerns reported 04/02/24- pt  reports she is doing well, is pleased CBG has improved,  fasting ranges 70-110, pharmD to outreach pt today 04/09/24- pt reports  doing fine, states blood sugar  is about the same  pt is monitored with CGM which shows 8% in low range over the past 7 days, alarms <70, most low readings in 60's range, pt states she eats carbohydrate and protein when this happens and it always corrects  Goal:  Over the next 30 days, the patient will not experience hospital readmission  Interventions:  Diabetes Interventions: Assessed patient's understanding of A1c goal: <7% Reviewed medications with patient and discussed importance of medication adherence Counseled on importance of regular laboratory monitoring as prescribed Discussed plans with patient for ongoing care management follow up and provided patient with direct contact information for care management team Reviewed scheduled/upcoming provider appointments including: primary care provider 9/22, pharmD 9/16 Review of patient  status, including review of consultants reports, relevant laboratory and other test results, and medications completed Lab Results  Component Value Date   HGBA1C 15.1 (H) 03/11/2024  Reinforced carbohydrate modified diet, plate method Reinforced effects of uncontrolled blood sugar on the body/ organs Reviewed signs/ symptoms hypoglycemia, actions to take for treatment Reported hypoglycemic episodes to primary care provider, encouraged pt to discuss at upcoming appointment on 04/22/24  Patient Self Care Activities:  Attend all scheduled provider appointments Attend church or other social activities Call pharmacy for medication refills 3-7 days in advance of running out of medications Call provider office for new concerns or questions  Notify RN Care Manager of TOC call rescheduling needs Participate in Transition of Care Program/Attend TOC scheduled calls Perform all self care activities independently  Perform IADL's  (shopping, preparing meals, housekeeping, managing finances) independently Take medications as prescribed   Work with the pharmacist to address medication management needs and will continue to work with the clinical team to address health care and disease management related needs check blood sugar at prescribed times: 4 times daily check feet daily for cuts, sores or redness enter blood sugar readings and medication or insulin  into daily log take the blood sugar log to all doctor visits trim toenails straight across drink 6 to 8 glasses of water each day fill half of plate with vegetables limit fast food meals to no more than 1 per week manage portion size prepare main meal at home 3 to 5 days each week set a realistic goal wash and dry feet carefully every day wear comfortable, cotton socks wear comfortable, well-fitting shoes Continue working with pharmacist Follow RULE OF 15 for low blood sugar management:  How to treat low blood sugars (Blood sugar less than 70 mg/dl  Please follow the RULE OF 15 for the treatment of hypoglycemia treatment (When your blood sugars are less than 70 mg/ dl) STEP  1:  Take 15 grams of carbohydrates when your blood sugar is low, which includes One tube of glucose gel STEP 2:  Recheck blood sugar in 15 minutes STEP 3:  3-4 glucose tabs or  3-4 oz of juice or regular soda or If your blood sugar is still low at the 15 minute recheck ---then, go back to STEP 1 and treat again with another 15 grams of carbohydrates  Plan:  Telephone follow up appointment with care management team member scheduled for:  04/16/24 @ 945 am The patient has been provided with contact information for the care management team and has been advised to call with any health related questions or concerns.         Recommendation:   PCP Follow-up  Follow Up Plan:   Telephone follow-up 04/16/24 @ 945 am  Mliss Creed [facility], BSN RN Care Manager/ Transition of Care [place]/ [facility] [phone]

## 2024-04-15 ENCOUNTER — Other Ambulatory Visit (HOSPITAL_BASED_OUTPATIENT_CLINIC_OR_DEPARTMENT_OTHER): Payer: Self-pay

## 2024-04-16 ENCOUNTER — Other Ambulatory Visit: Payer: Self-pay | Admitting: Pharmacist

## 2024-04-16 ENCOUNTER — Other Ambulatory Visit: Payer: Self-pay | Admitting: *Deleted

## 2024-04-16 DIAGNOSIS — E1069 Type 1 diabetes mellitus with other specified complication: Secondary | ICD-10-CM

## 2024-04-16 NOTE — Progress Notes (Signed)
 04/16/2024 Name: Shelby Rubio MRN: [id number] DOB: 2001-01-15  Chief Complaint  Patient presents with   Medication Management   Diabetes    Shelby Rubio is a 23 y.o. year old female who presented for a telephone visit.   They were referred to the pharmacist by their PCP for assistance in managing diabetes and medication access.    Subjective:  Care Team: Primary Care Provider: Lucius Krabbe, NP ; Next Scheduled Visit: 04/22/2024   Medication Access/Adherence  Current Pharmacy:  [facility] [address], [place] - [address] [place] [address] Phone: [phone] Fax: [phone]  [facility] [address] [place] [address] Phone: [phone] Fax: [phone]   Patient reports affordability concerns with their medications: Yes  - patient had stopped insulin  therapy due to cost. One hospitalization and 1 ED visits for DKA / hyperglycemia.  Since our initial visit, patient has been using [facility] pharmacy in [place] who will break a box of insulin  if needed. This allows her to fill for #3 pens = 30 day supply and cost of Lantus  is $25 / month. Cost of Humalog  is $25 / month. Patient states this is affordable.   Patient reports access/transportation concerns to their pharmacy: No  Patient reports adherence concerns with their medications:  No     Diabetes:  Current medications: Lantus  / insulin  glargine - inject 33 units once a day Humalog  / insulin  lispro - inject 12 units three times a day with meals.   Medications tried in the past: Semglee , glipizide , Trulicity   Current glucose readings:  Patient wore Libre 3 plus sensor for 2 weeks but her insurance prefers DexCom G7 so switched. [name]   CGM Documentation:  Days Worn: 14 (recommend 14 days) % Time CGM is active: 93% (goal >=70%) Average Glucose: 126 mg/dL Glucose Management Indicator: 6.3% Glucose Variability: 32.9% (goal  <36%) Time in Range:  - Time above range >250: 0% (typical goal: <5%) - Time above range 181-250: 12% (typical goal <20%) - Time in range 70-180 83% (typical goal >=70%) - Time below range 54-69: 5% (typical goal <4%) - Time below range: <1% (typical goal <1%)        Patient reports hypoglycemic s/sx including dizziness and lightheadedness. Hypoglycemia has decreased over the last week with Lantus  insulin  adjustment. Patient does use fingerstick blood glucose monitor to verify lows and calibrates Continuous Glucose Monitor / sensor as needed.  Patient denies hyperglycemic symptoms including no polyuria, polydipsia, polyphagia, nocturia, neuropathy.  Current medication access support: none   Objective:  Lab Results  Component Value Date   HGBA1C 15.1 (H) 03/11/2024    Lab Results  Component Value Date   CREATININE 0.46 03/20/2024   BUN 11 03/20/2024   NA 140 03/20/2024   K 4.1 03/20/2024   CL 104 03/20/2024   CO2 29 03/20/2024    Lab Results  Component Value Date   CHOL 146 10/26/2022   HDL 33 (L) 10/26/2022   LDLCALC 89 10/26/2022   TRIG 137 10/26/2022   CHOLHDL 4.4 10/26/2022   Current Outpatient Medications  Medication Instructions   Blood Glucose Monitoring Suppl (FREESTYLE LITE) w/Device KIT Use in the morning, at noon, and at bedtime.   ciprofloxacin -dexamethasone  (CIPRODEX ) OTIC suspension 4 drops, Left EAR, 2 times daily   Continuous Glucose Sensor (DEXCOM G7 SENSOR) MISC Use to check blood glucose continuously. Change sensor every 10 days.   Continuous  Glucose Sensor (DEXCOM G7 SENSOR) MISC Use to check glucose continuosly, changing sensor every 10 days.   Glucose Blood (BLOOD GLUCOSE TEST STRIPS) STRP Use 1 each in the morning, at noon, and at bedtime.   insulin  glargine (LANTUS ) 33-36 Units, Subcutaneous, Daily at bedtime   insulin  lispro (HUMALOG ) 12 Units, Subcutaneous, 3 times daily with meals, Please break box if able so patient is only charged 1  copay per fill.   Insulin  Pen Needle 31G X 8 MM MISC Use to inject insulin  4 times a day as instructed   Insulin  Pen Needle 32G X 4 MM MISC Use 1 pen needle once daily.   Lancets (FREESTYLE) lancets Use 1 each in the morning, at noon, and at bedtime.   levonorgestrel  (MIRENA ) 20 MCG/DAY IUD 1 each,  Once   pantoprazole  (PROTONIX ) 40 mg, Oral, Daily     Assessment/Plan:   Diabetes: Currently uncontrolled per A1c but home blood glucose readings are showing improved blood glucose since patient restarted Lantus  and short acting insulin  was added.  - Reviewed goal A1c, goal fasting, and goal 2 hour post prandial glucose - Continue Lantus  33 units once a day (patient will continue to get insulin  at [facility] since they can break boxes of insulin ) - Continue insulin  lispro 12 units 3 times a day.  - Reviewed how to treat low blood glucose events - Continue to use DexCom G7 sensor - prior authorization was approved thru 03/20/2024   Follow Up Plan: sees PCP 9/22; Nurse case manager will follow up with patient. Patient will contact me if she has any medication related questions or issues.   Madelin Ray, PharmD Clinical Pharmacist [facility]  Population Health [phone]

## 2024-04-16 NOTE — Patient Outreach (Signed)
 Transition of Care week 5  Visit Note  04/16/2024  Name: Shelby Rubio MRN: [id number]          DOB: 01-23-2001  Situation: Patient enrolled in [facility] 30-day program. Visit completed with patient by telephone.   Background:    Past Medical History:  Diagnosis Date   Abnormal menses 06/06/2018   Anxiety 2011?   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   DX in 09/2021, pt has struggled w/med compliance, but has lost weight on her own      Assessment: Patient Reported Symptoms: Cognitive Cognitive Status: No symptoms reported, Alert and oriented to person, place, and time, Normal speech and language skills, Able to follow simple commands      Neurological Neurological Review of Symptoms: No symptoms reported    HEENT HEENT Symptoms Reported: No symptoms reported      Cardiovascular Cardiovascular Symptoms Reported: No symptoms reported    Respiratory Respiratory Symptoms Reported: No symptoms reported    Endocrine Endocrine Symptoms Reported: No symptoms reported Is patient diabetic?: Yes Is patient checking blood sugars at home?: Yes List most recent blood sugar readings, include date and time of day: FBS ranges 90-130,  pt states rarely been having low blood sugar  CGM shows 1% of readings in low range over this past week,  pt continues working with pharm D Endocrine Self-Management Outcome: 4 (good) Endocrine Comment: reinforced signs/ symptoms hypoglycemia and treatment, reviewed carbohydrate modified diet, plate method  Gastrointestinal Gastrointestinal Symptoms Reported: No symptoms  reported      Genitourinary Genitourinary Symptoms Reported: No symptoms reported    Integumentary Integumentary Symptoms Reported: No symptoms reported    Musculoskeletal Musculoskelatal Symptoms Reviewed: No symptoms reported        Psychosocial Psychosocial Symptoms Reported: No symptoms reported         There were no vitals filed for this visit.  Medications Reviewed Today     Reviewed by Aura Mliss LABOR, RN (Registered Nurse) on 04/16/24 at [phone]  Med List Status: <None>   Medication Order Taking? Sig Documenting Provider Last Dose Status Informant  Blood Glucose Monitoring Suppl (FREESTYLE LITE) w/Device KIT [id number]  Use in the morning, at noon, and at bedtime.  Patient not taking: Reported on 04/02/2024   Ricky Fines, MD  Active   ciprofloxacin -dexamethasone  (CIPRODEX ) OTIC suspension [id number]  Place 4 drops into the left ear 2 (two) times daily. Lucius Krabbe, NP  Active   Continuous Glucose Sensor (DEXCOM G7 SENSOR) MISC [id number]  Use to check blood glucose continuously. Change sensor every 10 days. Lucius Krabbe, NP  Active   Continuous Glucose Sensor (DEXCOM G7 SENSOR) MISC [id number]  Use to check glucose continuosly, changing sensor every 10 days.   Active   Glucose Blood (BLOOD GLUCOSE TEST STRIPS) STRP [id number]  Use 1 each in the morning, at noon, and at bedtime. Ricky Fines, MD  Active   insulin  glargine (LANTUS ) 100 UNIT/ML Solostar Pen [id number]  Inject 33-36 Units into the skin at bedtime. Lucius Krabbe, NP  Active   insulin  lispro (HUMALOG ) 100 UNIT/ML KwikPen [id number]  Inject  12 Units into the skin 3 (three) times daily with meals. Please break box if able so patient is only charged 1 copay per fill. Lucius Krabbe, NP  Active   Insulin  Pen Needle 31G X 8 MM MISC [id number]  Use to inject insulin  4 times a day as instructed Ricky Fines, MD  Active   Insulin  Pen Needle 32G X 4 MM MISC [id number]  Use 1 pen needle once daily. Lucius Krabbe, NP  Active   Lancets (FREESTYLE) lancets [id number]  Use 1 each in the morning, at noon, and at bedtime.  Patient not taking: Reported on 04/02/2024   Ricky Fines, MD  Active   levonorgestrel  (MIRENA ) 20 MCG/DAY IUD [id number]  1 each by Intrauterine route once. [provider]  Active Self  pantoprazole  (PROTONIX ) 40 MG tablet [id number]  Take 1 tablet (40 mg total) by mouth daily. Ricky Fines, MD  Active             Goals Addressed             This Visit's Progress    COMPLETED: VBCI Transitions of Care (TOC) Care Plan       Problems:  Recent Hospitalization for treatment of DMII- type 1/ DKA Medication access barrier pt states she cannot afford lantus  insulin , has on hand now but this will be an issue going forward, before hospitalization pt ran out of insulin  Patient states she is going to social services to apply for medicaid, did not qualify for food stamps, she and 81 year old daughter live with her parents, pt states she has enough money for food, to pay her bills, but insulin  cost is an ongoing burden due to cost, pt agreeable to pharmacy referral, declines social work referral Patient discharged from [facility] on 8/15 with DKA, pt reports she went to [facility] ED for a 2nd opinion and  they told me everything is fine, nothing new to report and sent me home 03/25/24- pt spoke with pharmD on 8/20 and saw primary care provider 8/20- medication changes lantus  increased to 36 units @ hs, humalog  increased to 12 units before meals, pt feels CBG has improved and is working on her diet, no new concerns reported 04/02/24- pt reports she is doing well, is pleased CBG has improved,  fasting ranges 70-110, pharmD to outreach pt today 04/09/24- pt reports  doing fine, states blood sugar  is about the same  pt is monitored with CGM which shows 8% in low range over the past 7 days, alarms <70, most low readings in 60's range, pt states she eats carbohydrate and protein when this  happens and it always corrects 04/16/24- pt reports she is doing well, FBS ranges 90-130, hypoglycemia has improved with 1% of readings in low range over the past 7 days, pt agreeable to transfer to RN longitudinal case manager  Goal:  Over the next 30 days, the patient will not experience hospital readmission  Interventions:  Diabetes Interventions: Assessed patient's understanding of A1c goal: <7% Reviewed medications with patient and discussed importance of medication adherence Counseled on importance of regular laboratory monitoring as prescribed Discussed plans with patient for ongoing care management follow up and provided patient with direct contact information for care management team Reviewed scheduled/upcoming provider appointments including: primary care provider 9/22, pharmD 9/16 Review of patient status, including review of consultants reports, relevant laboratory and other test results, and medications completed Lab Results  Component Value Date   HGBA1C 15.1 (H) 03/11/2024  Reviewed carbohydrate  modified diet, plate method Reviewed effects of uncontrolled blood sugar on the body/ organs Reinforced signs/ symptoms hypoglycemia, actions to take for treatment Placed order for referral to RN longitudinal case manager  Patient Self Care Activities:  Attend all scheduled provider appointments Attend church or other social activities Call pharmacy for medication refills 3-7 days in advance of running out of medications Call provider office for new concerns or questions  Notify RN Care Manager of TOC call rescheduling needs Participate in Transition of Care Program/Attend TOC scheduled calls Perform all self care activities independently  Perform IADL's (shopping, preparing meals, housekeeping, managing finances) independently Take medications as prescribed   Work with the pharmacist to address medication management needs and will continue to work with the clinical team to  address health care and disease management related needs check blood sugar at prescribed times: 4 times daily check feet daily for cuts, sores or redness enter blood sugar readings and medication or insulin  into daily log take the blood sugar log to all doctor visits trim toenails straight across drink 6 to 8 glasses of water each day fill half of plate with vegetables limit fast food meals to no more than 1 per week manage portion size prepare main meal at home 3 to 5 days each week set a realistic goal wash and dry feet carefully every day wear comfortable, cotton socks wear comfortable, well-fitting shoes Continue working with pharmacist Care guide will outreach you to schedule a telephone visit with RN Case Manager Follow RULE OF 15 for low blood sugar management:  How to treat low blood sugars (Blood sugar less than 70 mg/dl  Please follow the RULE OF 15 for the treatment of hypoglycemia treatment (When your blood sugars are less than 70 mg/ dl) STEP  1:  Take 15 grams of carbohydrates when your blood sugar is low, which includes One tube of glucose gel STEP 2:  Recheck blood sugar in 15 minutes STEP 3:  3-4 glucose tabs or  3-4 oz of juice or regular soda or If your blood sugar is still low at the 15 minute recheck ---then, go back to STEP 1 and treat again with another 15 grams of carbohydrates  Plan:  The patient has been provided with contact information for the care management team and has been advised to call with any health related questions or concerns.  Care guide will contact you to schedule outreach with RN Case Manager         Recommendation:   PCP Follow-up  Follow Up Plan:   Closing From:  Transitions of Care Program  Mliss Creed [facility], BSN RN Care Manager/ Transition of Care [place]/ [facility] Population Health [phone]

## 2024-04-16 NOTE — Patient Instructions (Signed)
 Visit Information  Thank you for taking time to visit with me today. Please don't hesitate to contact me if I can be of assistance to you before our next scheduled telephone appointment.  Following is a copy of your care plan:   Goals Addressed             This Visit's Progress    COMPLETED: VBCI Transitions of Care (TOC) Care Plan       Problems:  Recent Hospitalization for treatment of DMII- type 1/ DKA Medication access barrier pt states she cannot afford lantus  insulin , has on hand now but this will be an issue going forward, before hospitalization pt ran out of insulin  Patient states she is going to social services to apply for medicaid, did not qualify for food stamps, she and 5 year old daughter live with her parents, pt states she has enough money for food, to pay her bills, but insulin  cost is an ongoing burden due to cost, pt agreeable to pharmacy referral, declines social work referral Patient discharged from [facility] on 8/15 with DKA, pt reports she went to [facility] ED for a 2nd opinion and  they told me everything is fine, nothing new to report and sent me home 03/25/24- pt spoke with pharmD on 8/20 and saw primary care provider 8/20- medication changes lantus  increased to 36 units @ hs, humalog  increased to 12 units before meals, pt feels CBG has improved and is working on her diet, no new concerns reported 04/02/24- pt reports she is doing well, is pleased CBG has improved,  fasting ranges 70-110, pharmD to outreach pt today 04/09/24- pt reports  doing fine, states blood sugar  is about the same  pt is monitored with CGM which shows 8% in low range over the past 7 days, alarms <70, most low readings in 60's range, pt states she eats carbohydrate and protein when this happens and it always corrects 04/16/24- pt reports she is doing well, FBS ranges 90-130, hypoglycemia has improved with 1% of readings in low range over the past 7 days, pt agreeable to transfer to RN longitudinal case  manager  Goal:  Over the next 30 days, the patient will not experience hospital readmission  Interventions:  Diabetes Interventions: Assessed patient's understanding of A1c goal: <7% Reviewed medications with patient and discussed importance of medication adherence Counseled on importance of regular laboratory monitoring as prescribed Discussed plans with patient for ongoing care management follow up and provided patient with direct contact information for care management team Reviewed scheduled/upcoming provider appointments including: primary care provider 9/22, pharmD 9/16 Review of patient status, including review of consultants reports, relevant laboratory and other test results, and medications completed Lab Results  Component Value Date   HGBA1C 15.1 (H) 03/11/2024  Reviewed carbohydrate modified diet, plate method Reviewed effects of uncontrolled blood sugar on the body/ organs Reinforced signs/ symptoms hypoglycemia, actions to take for treatment Placed order for referral to RN longitudinal case manager  Patient Self Care Activities:  Attend all scheduled provider appointments Attend church or other social activities Call pharmacy for medication refills 3-7 days in advance of running out of medications Call provider office for new concerns or questions  Notify RN Care Manager of TOC call rescheduling needs Participate in Transition of Care Program/Attend TOC scheduled calls Perform all self care activities independently  Perform IADL's (shopping, preparing meals, housekeeping, managing finances) independently Take medications as prescribed   Work with the pharmacist to address medication management needs and will continue  to work with the clinical team to address health care and disease management related needs check blood sugar at prescribed times: 4 times daily check feet daily for cuts, sores or redness enter blood sugar readings and medication or insulin  into daily  log take the blood sugar log to all doctor visits trim toenails straight across drink 6 to 8 glasses of water each day fill half of plate with vegetables limit fast food meals to no more than 1 per week manage portion size prepare main meal at home 3 to 5 days each week set a realistic goal wash and dry feet carefully every day wear comfortable, cotton socks wear comfortable, well-fitting shoes Continue working with pharmacist Care guide will outreach you to schedule a telephone visit with RN Case Manager Follow RULE OF 15 for low blood sugar management:  How to treat low blood sugars (Blood sugar less than 70 mg/dl  Please follow the RULE OF 15 for the treatment of hypoglycemia treatment (When your blood sugars are less than 70 mg/ dl) STEP  1:  Take 15 grams of carbohydrates when your blood sugar is low, which includes One tube of glucose gel STEP 2:  Recheck blood sugar in 15 minutes STEP 3:  3-4 glucose tabs or  3-4 oz of juice or regular soda or If your blood sugar is still low at the 15 minute recheck ---then, go back to STEP 1 and treat again with another 15 grams of carbohydrates  Plan:  The patient has been provided with contact information for the care management team and has been advised to call with any health related questions or concerns.  Care guide will contact you to schedule outreach with RN Case Manager        Patient verbalizes understanding of instructions and care plan provided today and agrees to view in MyChart. Active MyChart status and patient understanding of how to access instructions and care plan via MyChart confirmed with patient.     The patient has been provided with contact information for the care management team and has been advised to call with any health related questions or concerns.   Please call the care guide team at [phone] if you need to cancel or reschedule your appointment.   Please call the Suicide and Crisis Lifeline: 988 call  the [place]  National Suicide Prevention Lifeline: 323-429-1662 or TTY: 573 057 0495 TTY 775-080-7835) to talk to a trained counselor call 1-800-273-TALK (toll free, 24 hour hotline) go to [facility] [address] [place] [phone]) call the [facility] Crisis Line: [phone] call 911 if you are experiencing a Mental Health or Behavioral Health Crisis or need someone to talk to.  Mliss Creed [facility], BSN RN Care Manager/ Transition of Care / [facility] [phone]

## 2024-04-22 ENCOUNTER — Other Ambulatory Visit (HOSPITAL_BASED_OUTPATIENT_CLINIC_OR_DEPARTMENT_OTHER): Payer: Self-pay

## 2024-04-22 ENCOUNTER — Telehealth: Payer: Self-pay | Admitting: *Deleted

## 2024-04-22 ENCOUNTER — Encounter: Payer: Self-pay | Admitting: Family

## 2024-04-22 ENCOUNTER — Telehealth: Admitting: Family

## 2024-04-22 DIAGNOSIS — L700 Acne vulgaris: Secondary | ICD-10-CM

## 2024-04-22 DIAGNOSIS — E1065 Type 1 diabetes mellitus with hyperglycemia: Secondary | ICD-10-CM | POA: Diagnosis not present

## 2024-04-22 NOTE — Progress Notes (Signed)
 Complex Care Management Note  Care Guide Note 04/22/2024 Name: [name] MRN: [id number] DOB: [birth year]  Shelby Rubio is a 23 y.o. year old female who sees Lucius Krabbe, NP for primary care. I reached out to Alan G Temme by phone today to offer complex care management services.  [name] was given information about Complex Care Management services today including:   The Complex Care Management services include support from the care team which includes your Nurse Care Manager, Clinical Social Worker, or Pharmacist.  The Complex Care Management team is here to help remove barriers to the health concerns and goals most important to you. Complex Care Management services are voluntary, and the patient may decline or stop services at any time by request to their care team member.   Complex Care Management Consent Status: Patient agreed to services and verbal consent obtained.   Follow up plan:  Telephone appointment with complex care management team member scheduled for:  05/07/2024  Encounter Outcome:  Patient Scheduled  Thedford Franks, CMA [place]  [facility] Guide Direct Dial : [phone]  Fax: [phone] Website: [email or web address]

## 2024-04-22 NOTE — Progress Notes (Signed)
 MyChart Video Visit    Virtual Visit via Video Note   This format is felt to be most appropriate for this patient at this time. Physical exam was limited by quality of the video and audio technology used for the visit. CMA was able to get the patient set up on a video visit.  Patient location: Home. Patient and provider in visit Provider location: Office  I discussed the limitations of evaluation and management by telemedicine and the availability of in person appointments. The patient expressed understanding and agreed to proceed.  Visit Date: 04/22/2024  Today's healthcare provider: Lucius Krabbe, NP     Subjective:   Patient ID: Shelby Rubio, female    DOB: September 22, 2000, 23 y.o.   MRN: [id number]  Chief Complaint  Patient presents with   Type 1 diabetes mellitus with hyperglycemia  HPI: F/U for T1DM. Taking Lantus  33-36units qhs, based on CBG reading prior to taking and Humalog  12u preprandial. Was seeing PharmD weekly and able to see Dexcom readings through online reporting.  Most readings are 70-150 over the last 14d, with just a few spikes up to 180. Reports noticing some mornings her # will rise 30 points, between checking fasting after wakening to time just before she eats breakfast. Reports acne and skin rash have improved since taking the Doxy and getting blood sugar under better control.  Assessment and Plan  Type 1 diabetes mellitus with hyperglycemia (HCC) Assessment & Plan: Type 1 diabetes mellitus doing better. under [facility] pharmacy in [place].  Taking Lantus  33-36units qhs, based on CBG readings prior to taking and Humalog  12u preprandial. Was seeing PharmD weekly, now will be prn, and we are able to see Dexcom readings through online reporting.  Most readings are 70-150 over the last 14d, with just a few spikes up to 180.  Dexcom CGM working well, but she says it gives too high or low readings about 1-2d prior to switching the sensor. - Continue same Lantus  dose  at bedtime. - Continue Humalog  12 units before meals. - No refills needed today, pharm is refilling Dexcom - Instruct to check fasting blood sugar with a goal of less than 120 mg/dL. - Instruct to check blood sugar before each meal with a goal of less than 180 mg/dL. - Advise to administer Humalog  before meals unless blood sugar is below 100 mg/dL. - F/U  in 3 months in office   Past Medical History:  Diagnosis Date   Abnormal menses 06/06/2018   Anxiety 2011?   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   DX in 09/2021, pt has struggled w/med compliance, but has lost weight on her own      Past Surgical History:  Procedure Laterality Date   ADENOIDECTOMY     TONSILLECTOMY     tubes in ears      Outpatient Medications Prior to Visit  Medication Sig Dispense Refill   Continuous Glucose Sensor (DEXCOM G7 SENSOR) MISC Use to check blood glucose continuously. Change sensor every 10 days. 3 each 2   Continuous Glucose Sensor (DEXCOM G7 SENSOR) MISC  Use to check glucose continuosly, changing sensor every 10 days. 3 each 2   Glucose Blood (BLOOD GLUCOSE TEST STRIPS) STRP Use 1 each in the morning, at noon, and at bedtime. 100 each 3   insulin  glargine (LANTUS ) 100 UNIT/ML Solostar Pen Inject 33-36 Units into the skin at bedtime. 9 mL 1   insulin  lispro (HUMALOG ) 100 UNIT/ML KwikPen Inject 12 Units into the skin 3 (three) times daily with meals. Please break box if able so patient is only charged 1 copay per fill. 9 mL 1   Insulin  Pen Needle 31G X 8 MM MISC Use to inject insulin  4 times a day as instructed 150  each 3   Insulin  Pen Needle 32G X 4 MM MISC Use 1 pen needle once daily. 100 each 2   levonorgestrel  (MIRENA ) 20 MCG/DAY IUD 1 each by Intrauterine route once.     Blood Glucose Monitoring Suppl (FREESTYLE LITE) w/Device KIT Use in the morning, at noon, and at bedtime. 1 kit 0   Lancets (FREESTYLE) lancets Use 1 each in the morning, at noon, and at bedtime. (Patient not taking: Reported on 04/02/2024) 100 each 0   ciprofloxacin -dexamethasone  (CIPRODEX ) OTIC suspension Place 4 drops into the left ear 2 (two) times daily. 7.5 mL 0   pantoprazole  (PROTONIX ) 40 MG tablet Take 1 tablet (40 mg total) by mouth daily. 30 tablet 1   No facility-administered medications prior to visit.    Allergies  Allergen Reactions   Penicillins Hives   Amoxil [Amoxicillin] Hives       Objective:   Physical Exam Vitals and nursing note reviewed.  Constitutional:      General: Pt is not in acute distress.    Appearance: Normal appearance.  HENT:     Head: Normocephalic.  Pulmonary:     Effort: No respiratory distress.  Musculoskeletal:     Cervical back: Normal range of motion.  Skin:    General: Skin is dry.     Coloration: Skin is not pale.  Neurological:     Mental Status: Pt is alert and oriented to person, place, and time.  Psychiatric:        Mood and Affect: Mood normal.   LMP 04/17/2024 (Approximate)   Wt Readings from Last 3 Encounters:  03/20/24 157 lb 6.4 oz (71.4 kg)  03/11/24 143 lb 4.8 oz (65 kg)  09/19/23 156 lb 12.8 oz (71.1 kg)      I discussed the assessment and treatment plan with the patient. The patient was provided an opportunity to ask questions and all were answered. The patient agreed with the plan and demonstrated an understanding of the instructions.   The patient was advised to call back or seek an in-person evaluation if the symptoms worsen or if the condition fails to improve as anticipated.  Lucius Krabbe, NP [facility] [phone] (phone) [phone] (fax)  [facility]

## 2024-04-22 NOTE — Assessment & Plan Note (Signed)
 Type 1 diabetes mellitus doing better. under [facility] pharmacy in [place].  Taking Lantus  33-36units qhs, based on CBG readings prior to taking and Humalog  12u preprandial. Was seeing PharmD weekly, now will be prn, and we are able to see Dexcom readings through online reporting.  Most readings are 70-150 over the last 14d, with just a few spikes up to 180.  Dexcom CGM working well, but she says it gives too high or low readings about 1-2d prior to switching the sensor. - Continue same Lantus  dose at bedtime. - Continue Humalog  12 units before meals. - No refills needed today, pharm is refilling Dexcom - Instruct to check fasting blood sugar with a goal of less than 120 mg/dL. - Instruct to check blood sugar before each meal with a goal of less than 180 mg/dL. - Advise to administer Humalog  before meals unless blood sugar is below 100 mg/dL. - F/U  in 3 months in office

## 2024-05-07 ENCOUNTER — Other Ambulatory Visit: Payer: Self-pay

## 2024-05-07 NOTE — Patient Instructions (Addendum)
 Visit Information  Thank you for taking time to visit with me today. Please don't hesitate to contact me if I can be of assistance to you before our next scheduled telephone appointment.  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  Goal: Over the next 3 months the Patient will continue to work with RN Care Manager and/or Social Worker to address care management and care coordination needs related to DM1 with hyperglycemia as evidenced by adherence to care management team scheduled appointments      Interventions:   Diabetes Interventions: Assessed patient's understanding of A1c goal: <7% Provided education to patient about basic DM disease process Reviewed medications with patient and discussed importance of medication adherence Counseled on importance of regular laboratory monitoring as prescribed Discussed plans with patient for ongoing care management follow up and provided patient with direct contact information for care management team Review of patient status, including review of consultants reports, relevant laboratory and other test results, and medications completed Screening for signs and symptoms of depression related to chronic disease state  Assessed social determinant of health barriers Lab Results  Component Value Date   HGBA1C 15.1 (H) 03/11/2024    Patient Self-Care Activities:  Attend all scheduled provider appointments Attend church or other social activities Call pharmacy for medication refills 3-7 days in advance of running out of medications Call provider office for new concerns or questions  Perform all self care activities independently  Perform IADL's (shopping, preparing meals, housekeeping, managing finances)  independently Take medications as prescribed   Work with the pharmacist to address medication management needs and will continue to work with the clinical team to address health care and disease management related needs check blood sugar at prescribed times: before meals and at bedtime check feet daily for cuts, sores or redness set goal weight drink 6 to 8 glasses of water each day eat fish at least once per week fill half of plate with vegetables limit fast food meals to no more than 1 per week manage portion size switch to low-fat or skim milk switch to sugar-free drinks keep feet up while sitting wash and dry feet carefully every day wear comfortable, cotton socks wear comfortable, well-fitting shoes  Plan:  The patient has been provided with contact information for the care management team and has been advised to call with any health related questions or concerns.              Patient verbalizes understanding of instructions and care plan provided today and agrees to view in MyChart. Active MyChart status and patient understanding of how to access instructions and care plan via MyChart confirmed with patient.     The patient has been provided with contact information for the care management team and has been advised to call with any health related questions or concerns.   Please call the care guide team at [phone] if you need to cancel or reschedule your appointment.   Please call 1-800-273-TALK (toll free, 24 hour hotline) if you are experiencing a Mental Health or Behavioral Health Crisis or need someone to talk to.  Vidit Boissonneault A. Gordy RN, BA, [facility], CRRN [facility]  [facility] Population Health RN Care Manager Direct Dial : [phone]  Fax: [phone]

## 2024-05-07 NOTE — Patient Outreach (Signed)
 Complex Care Management   Visit Note  05/07/2024  Name:  Shelby Rubio MRN: [id number] DOB: Jan 26, 2001  Situation: Referral received for Complex Care Management related to Diabetes with Complications I obtained verbal consent from Patient.  Visit completed with Patient  on the phone  Background:   Past Medical History:  Diagnosis Date   Abnormal menses 06/06/2018   Anxiety 2011?   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   DX in 09/2021, pt has struggled w/med compliance, but has lost weight on her own      Assessment: Patient Reported Symptoms:  Cognitive Cognitive Status: No symptoms reported, Alert and oriented to person, place, and time, Normal speech and language skills, Insightful and able to interpret abstract concepts Cognitive/Intellectual Conditions Management [RPT]: None reported or documented in medical history or problem list   Health Maintenance Behaviors: Annual physical exam Healing Pattern: Unsure Health Facilitated by: Healthy diet  Neurological Neurological Review of Symptoms: No symptoms reported    HEENT HEENT Symptoms Reported: No symptoms reported      Cardiovascular Cardiovascular Symptoms Reported: No symptoms reported    Respiratory Respiratory Symptoms Reported: No symptoms reported    Endocrine Endocrine Symptoms Reported: No symptoms reported Is patient diabetic?: Yes Is patient checking blood sugars at home?: Yes Endocrine Self-Management Outcome: 4 (good)  Gastrointestinal Gastrointestinal Symptoms Reported: No symptoms reported       Genitourinary Genitourinary Symptoms Reported: No symptoms reported    Integumentary Integumentary Symptoms Reported: No symptoms reported    Musculoskeletal Musculoskelatal Symptoms Reviewed: No symptoms reported        Psychosocial Psychosocial Symptoms Reported: No symptoms reported Behavioral Health Self-Management Outcome: 4 (good)   Quality of Family Relationships: supportive Do you feel physically threatened by others?: No    05/07/2024    PHQ2-9 Depression Screening   Little interest or pleasure in doing things Not at all  Feeling down, depressed, or hopeless Not at all  PHQ-2 - Total Score 0  Trouble falling or staying asleep, or sleeping too much    Feeling tired or having little energy    Poor appetite or overeating     Feeling bad about yourself - or that you are a failure or have let yourself or your family down    Trouble concentrating on things, such as reading the newspaper or watching television    Moving or speaking so slowly that other people could have noticed.  Or the opposite - being so fidgety or restless that you have been moving around a lot more than usual    Thoughts that you would be better off dead, or hurting yourself in some way    PHQ2-9 Total Score    If you checked off any problems, how difficult have these problems made it for you to do your work, take care of things at home, or get along with other people    Depression Interventions/Treatment      There were no vitals filed for this visit.  Medications Reviewed Today     Reviewed by Gordy Channing LABOR, RN (Registered  Nurse) on 05/07/24 at 1123  Med List Status: <None>   Medication Order Taking? Sig Documenting Provider Last Dose Status Informant  Blood Glucose Monitoring Suppl (FREESTYLE LITE) w/Device KIT [id number]  Use in the morning, at noon, and at bedtime. Ricky Fines, MD  Active   Continuous Glucose Sensor (DEXCOM G7 SENSOR) [place] [id number] Yes Use to check blood glucose  continuously. Change sensor every 10 days. Lucius Krabbe, NP  Active   Continuous Glucose Sensor (DEXCOM G7 SENSOR) MISC [id number] Yes Use to check glucose continuosly, changing sensor every 10 days.   Active   Glucose Blood (BLOOD GLUCOSE TEST STRIPS) STRP [id number]  Use 1 each in the morning, at noon, and at bedtime. Ricky Fines, MD  Active   insulin  glargine (LANTUS ) 100 UNIT/ML Solostar Pen [id number] Yes Inject 33-36 Units into the skin at bedtime. Lucius Krabbe, NP  Active   insulin  lispro (HUMALOG ) 100 UNIT/ML KwikPen [id number] Yes Inject 12 Units into the skin 3 (three) times daily with meals. Please break box if able so patient is only charged 1 copay per fill. Lucius Krabbe, NP  Active   Insulin  Pen Needle 31G X 8 MM MISC [id number] Yes Use to inject insulin  4 times a day as instructed Ricky Fines, MD  Active   Insulin  Pen Needle 32G X 4 MM MISC [id number] Yes Use 1 pen needle once daily. Lucius Krabbe, NP  Active   Lancets (FREESTYLE) lancets [id number]  Use 1 each in the morning, at noon, and at bedtime.  Patient not taking: Reported on 05/07/2024   Ricky Fines, MD  Active   levonorgestrel  (MIRENA ) 20 MCG/DAY IUD [id number] Yes 1 each by Intrauterine route once. [provider]  Active Self            Recommendation:   Continue Current Plan of Care  Follow Up Plan:   Telephone follow up appointment date/time:  06/05/24 at 12:30 pm.  Channing A. Gordy RN, BA, [facility], CRRN [place] RN Care Manager Direct Dial : [phone]  Fax: [phone]

## 2024-05-10 ENCOUNTER — Other Ambulatory Visit (HOSPITAL_BASED_OUTPATIENT_CLINIC_OR_DEPARTMENT_OTHER): Payer: Self-pay

## 2024-05-10 DIAGNOSIS — E1165 Type 2 diabetes mellitus with hyperglycemia: Secondary | ICD-10-CM | POA: Insufficient documentation

## 2024-05-10 DIAGNOSIS — E1065 Type 1 diabetes mellitus with hyperglycemia: Secondary | ICD-10-CM | POA: Diagnosis not present

## 2024-05-10 MED ORDER — INSULIN LISPRO (1 UNIT DIAL) 100 UNIT/ML (KWIKPEN)
PEN_INJECTOR | SUBCUTANEOUS | 5 refills | Status: AC
  Filled 2024-05-10: qty 15, 30d supply, fill #0

## 2024-05-10 MED ORDER — DEXCOM G7 SENSOR MISC
5 refills | Status: AC
  Filled 2024-05-20: qty 3, 30d supply, fill #0
  Filled 2024-06-17: qty 3, 30d supply, fill #1

## 2024-05-10 MED ORDER — INSULIN GLARGINE SOLOSTAR 100 UNIT/ML ~~LOC~~ SOPN
PEN_INJECTOR | SUBCUTANEOUS | 5 refills | Status: AC
  Filled 2024-05-10: qty 15, 30d supply, fill #0
  Filled 2024-06-17: qty 15, 30d supply, fill #1

## 2024-05-16 ENCOUNTER — Telehealth: Payer: Self-pay | Admitting: Pharmacist

## 2024-05-16 NOTE — Progress Notes (Signed)
 05/16/2024 Name: Shelby Rubio MRN: [id number] DOB: 2000/09/30  Chief Complaint  Patient presents with   Diabetes    Shelby Rubio is a 23 y.o. year old female who presented for a telephone visit.   They were referred to the pharmacist by their PCP for assistance in managing diabetes and medication access.    Subjective:  Care Team: Primary Care Provider: Lucius Krabbe, NP ; Next Scheduled Visit: 07/23/2024 Patient saw Shelby Rubio initial consult this week. She will be meeting with diabetes educator to discuss starting an insulin  pump next week.    Medication Access/Adherence  Current Pharmacy:  [facility] [address], [address] [place] [address] Phone: [phone] Fax: [phone]   Patient reports affordability concerns with their medications: Yes  - patient had stopped insulin  therapy due to cost. One hospitalization and 1 ED visits for DKA / hyperglycemia.  Since our initial visit, patient has been using [facility] in [place] who will break a box of insulin  if needed. This allows her to fill for #3 pens = 30 day supply and cost of Lantus  is $25 / month. Cost of Humalog  is $25 / month. Patient states this is affordable.   Patient reports access/transportation concerns to their pharmacy: No  Patient reports adherence concerns with their medications:  No     Diabetes:  Current medications: Lantus  / insulin  glargine - inject 27 units once a day Humalog  / insulin  lispro - inject 12 units with breakfast and lunch; 10 units with eveing meal   Medications tried in the past: Semglee , glipizide , Trulicity   Current glucose readings:  Patient wore Libre 3 plus sensor for 2 weeks but her insurance prefers DexCom G7 so switched. Shelby Rubio   CGM Documentation:  Days Worn: 14 (recommend 14 days) % Time CGM is active: 97% (goal >=70%) Average Glucose: 119 mg/dL Glucose Management Indicator: 6.2% Glucose Variability: 38.2% (goal  <36%) Time in Range:  - Time above range >250: 1% (typical goal: <5%) - Time above range 181-250: 9% (typical goal <20%) - Time in range 70-180: 81% (typical goal >=70%) - Time below range 54-69: 8% (typical goal <4%) - Time below range: 1% (typical goal <1%)        Patient reports her Dexcom sensor has reported low blood glucose but when she checks with fingerstick blood glucose is in normal range - sometimes as much as 40 point higher.  Shelby Rubio had a pattern of significant lows between 3:25 AM and 3:50 AM. 5 low events contributed to this pattern. None of the contributing events were rebound lows.  Current medication access support: none   Objective:  Lab Results  Component Value Date   HGBA1C 15.1 (H) 03/11/2024    Lab Results  Component Value Date   CREATININE 0.46 03/20/2024   BUN 11 03/20/2024   NA 140 03/20/2024   K 4.1 03/20/2024   CL 104 03/20/2024   CO2 29 03/20/2024    Lab Results  Component Value Date   CHOL 146 10/26/2022   HDL 33 (L) 10/26/2022   LDLCALC 89 10/26/2022   TRIG 137 10/26/2022   CHOLHDL 4.4 10/26/2022   Current Outpatient Medications  Medication Instructions   Blood Glucose Monitoring Suppl (FREESTYLE LITE) w/Device KIT Use in the morning, at noon, and at bedtime.   Continuous Glucose Sensor (DEXCOM G7 SENSOR) MISC Use to check blood glucose continuously. Change sensor every 10 days.   Continuous Glucose Sensor (DEXCOM G7  SENSOR) MISC Use to check glucose continuosly, changing sensor every 10 days.   Continuous Glucose Sensor (DEXCOM G7 SENSOR) MISC Apply 1 sensor every 10 days   Glucose Blood (BLOOD GLUCOSE TEST STRIPS) STRP Use 1 each in the morning, at noon, and at bedtime.   insulin  glargine (LANTUS ) 33-36 Units, Subcutaneous, Daily at bedtime   Insulin  Glargine Solostar (LANTUS ) 100 UNIT/ML Solostar Pen Inject 27 units every night, max daily dose of 50 units   insulin  lispro (HUMALOG ) 100 UNIT/ML KwikPen Take 12 units prior to  breakfast and lunch, take 10 units before dinner.  Max daily dose of 50 units/day   insulin  lispro (HUMALOG ) 12 Units, Subcutaneous, 3 times daily with meals, Please break box if able so patient is only charged 1 copay per fill.   Insulin  Pen Needle 31G X 8 MM MISC Use to inject insulin  4 times a day as instructed   Insulin  Pen Needle 32G X 4 MM MISC Use 1 pen needle once daily.   Lancets (FREESTYLE) lancets Use 1 each in the morning, at noon, and at bedtime.   levonorgestrel  (MIRENA ) 20 MCG/DAY IUD 1 each,  Once     Assessment/Plan:   Diabetes: Currently uncontrolled per A1c but home blood glucose readings are showing improved blood glucose since patient restarted Lantus  and short acting insulin  was added.  - Reviewed goal A1c, goal fasting, and goal 2 hour post prandial glucose - Provided technical support number for her to contact DexCom about questionable sensor readings. Also provided information on how to get 1 time free sensor coupon on line so that she can go ahead and get a new sensor since she cannot refill on insurance until next week.  - Continue insulin  per endocrinologist office  - Reviewed how to treat low blood glucose events  Follow Up Plan: will check in with patient from time to time to check on any medication issues but since she is seeing endocrinology will not make any adjustments in diabetes therapy.  Shelby Rubio, PharmD Clinical Pharmacist [facility]  Population Health [phone]

## 2024-05-20 ENCOUNTER — Other Ambulatory Visit (HOSPITAL_BASED_OUTPATIENT_CLINIC_OR_DEPARTMENT_OTHER): Payer: Self-pay

## 2024-05-20 DIAGNOSIS — E1065 Type 1 diabetes mellitus with hyperglycemia: Secondary | ICD-10-CM | POA: Diagnosis not present

## 2024-05-22 ENCOUNTER — Other Ambulatory Visit (HOSPITAL_BASED_OUTPATIENT_CLINIC_OR_DEPARTMENT_OTHER): Payer: Self-pay

## 2024-05-22 MED ORDER — BAQSIMI TWO PACK 3 MG/DOSE NA POWD
NASAL | 11 refills | Status: AC
  Filled 2024-05-22: qty 2, 30d supply, fill #0

## 2024-05-26 ENCOUNTER — Encounter: Payer: Self-pay | Admitting: Family

## 2024-05-28 ENCOUNTER — Other Ambulatory Visit (HOSPITAL_BASED_OUTPATIENT_CLINIC_OR_DEPARTMENT_OTHER): Payer: Self-pay

## 2024-05-28 ENCOUNTER — Ambulatory Visit (INDEPENDENT_AMBULATORY_CARE_PROVIDER_SITE_OTHER): Admitting: Family

## 2024-05-28 ENCOUNTER — Encounter: Payer: Self-pay | Admitting: Family

## 2024-05-28 VITALS — BP 122/80 | HR 90 | Temp 98.0°F | Ht 64.0 in | Wt 169.2 lb

## 2024-05-28 DIAGNOSIS — M79671 Pain in right foot: Secondary | ICD-10-CM

## 2024-05-28 DIAGNOSIS — M2141 Flat foot [pes planus] (acquired), right foot: Secondary | ICD-10-CM | POA: Diagnosis not present

## 2024-05-28 DIAGNOSIS — Z794 Long term (current) use of insulin: Secondary | ICD-10-CM | POA: Diagnosis not present

## 2024-05-28 DIAGNOSIS — E1165 Type 2 diabetes mellitus with hyperglycemia: Secondary | ICD-10-CM | POA: Diagnosis not present

## 2024-05-28 DIAGNOSIS — M79672 Pain in left foot: Secondary | ICD-10-CM

## 2024-05-28 DIAGNOSIS — M2142 Flat foot [pes planus] (acquired), left foot: Secondary | ICD-10-CM

## 2024-05-28 MED ORDER — GABAPENTIN 100 MG PO CAPS
100.0000 mg | ORAL_CAPSULE | Freq: Every day | ORAL | 5 refills | Status: AC
  Filled 2024-05-28: qty 30, 30d supply, fill #0
  Filled 2024-07-04: qty 30, 30d supply, fill #1
  Filled 2024-08-14: qty 30, 30d supply, fill #2

## 2024-05-28 NOTE — Progress Notes (Signed)
 Patient ID: Shelby Rubio, female    DOB: Jan 21, 2001, 23 y.o.   MRN: [id number]  Chief Complaint  Patient presents with   Foot Pain    Pt c/o bilateral foot pain and burning sensations, present for a couple of weeks.   Discussed the use of AI scribe software for clinical note transcription with the patient, who gave verbal consent to proceed.  History of Present Illness Shelby Rubio is a 23 year old female with diabetes who presents with burning and pain in her feet.  She experiences burning sensations in the balls of her feet, particularly towards the front and top part, more pronounced at night and upon waking. She also has pain is more noticeable during her 11-hour work shifts. Symptoms are more severe in the left foot but affect both feet. There is no tingling, pins and needles sensation, or numbness. She manages her diabetes with an average A1C of 6.3. She has ordered new shoes from [employer] for better support due to being flat-footed but has not tried cushion gel pads or insoles for flat feet. She works 11-hour shifts, which involve prolonged standing.  Assessment & Plan Foot pain and burning, possible neuropathy Intermittent foot pain and burning, primarily in the balls of the feet, more in the left. Symptoms worsen with standing and walking, with burning at night and morning. No tingling or numbness. Differential includes diabetic neuropathy, not typical of plantar fasciitis. Blood sugar control is crucial. - Advise wearing shoes with good arch support for flat feet. - Recommend replacing insoles with those designed for flat feet, with cushion, such as Super Feet or Dr. Heriberto. - Prescribe low-dose gabapentin 100mg  at bedtime for burning sensation. - Consider podiatry referral if symptoms persist after trying new shoes and insoles.  Type 2 diabetes mellitus Blood sugar levels well-controlled at 6.3, average daily BS of 123 per Dexcom report review. Continued management essential to  prevent complications like neuropathy. - Continue regular blood sugar monitoring. - Emphasize wearing shoes to prevent foot injuries and complications. - F/U as previously planned  Flat feet (pes planus) Flat feet confirmed, contributing to foot pain. Proper footwear and insoles important for symptom management. - Advise wearing shoes designed for flat feet. - Recommend using insoles designed for flat feet for additional support.   Subjective:    Outpatient Medications Prior to Visit  Medication Sig Dispense Refill   Blood Glucose Monitoring Suppl (FREESTYLE LITE) w/Device KIT Use in the morning, at noon, and at bedtime. 1 kit 0   Continuous Glucose Sensor (DEXCOM G7 SENSOR) MISC Use to check blood glucose continuously. Change sensor every 10 days. 3 each 2   Continuous Glucose Sensor (DEXCOM G7 SENSOR) MISC Use to check glucose continuosly, changing sensor every 10 days. 3 each 2   Continuous Glucose Sensor (DEXCOM G7 SENSOR) MISC Apply 1 sensor every 10 days 3 each 5   Glucagon (BAQSIMI TWO PACK) 3 MG/DOSE POWD Use 1 spray in 1 nostril once as directed for severe low blood sugar (hypoglycemia). 1 each 11   Glucose Blood (BLOOD GLUCOSE TEST STRIPS) STRP Use 1 each in the morning, at noon, and at bedtime. 100 each 3   insulin  glargine (LANTUS ) 100 UNIT/ML Solostar Pen Inject 33-36 Units into the skin at bedtime. 9 mL 1   Insulin  Glargine Solostar (LANTUS ) 100 UNIT/ML Solostar Pen Inject 27 units every night, max daily dose of 50 units 15 mL 5   insulin  lispro (HUMALOG ) 100 UNIT/ML KwikPen Inject 12  Units into the skin 3 (three) times daily with meals. Please break box if able so patient is only charged 1 copay per fill. 9 mL 1   insulin  lispro (HUMALOG ) 100 UNIT/ML KwikPen Take 12 units prior to breakfast and lunch, take 10 units before dinner.  Max daily dose of 50 units/day 15 mL 5   Insulin  Pen Needle 31G X 8 MM MISC Use to inject insulin  4 times a day as instructed 150 each 3   Insulin   Pen Needle 32G X 4 MM MISC Use 1 pen needle once daily. 100 each 2   Lancets (FREESTYLE) lancets Use 1 each in the morning, at noon, and at bedtime. 100 each 0   levonorgestrel  (MIRENA ) 20 MCG/DAY IUD 1 each by Intrauterine route once.     No facility-administered medications prior to visit.   Past Medical History:  Diagnosis Date   Abnormal menses 06/06/2018   Anxiety 2011?   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  Constitutional:      Appearance: Normal appearance.  Cardiovascular:     Rate and Rhythm: Normal rate and regular rhythm.  Pulmonary:     Effort: Pulmonary effort is normal.     Breath sounds: Normal breath sounds.  Musculoskeletal:        General: Normal range of motion.  Skin:    General: Skin is warm and dry.  Neurological:     Mental Status: She is alert.  Psychiatric:        Mood and Affect: Mood normal.        Behavior: Behavior normal.    BP 122/80 (BP Location: Left Arm, Patient Position: Sitting, Cuff Size: Normal)   Pulse 90   Temp 98 F (36.7 C) (Temporal)   Ht 5' 4 (1.626 m)   Wt 169 lb 3.2 oz (76.7  kg)   SpO2 98%   BMI 29.04 kg/m  Wt Readings from Last 3 Encounters:  05/28/24 169 lb 3.2 oz (76.7 kg)  03/20/24 157 lb 6.4 oz (71.4 kg)  03/11/24 143 lb 4.8 oz (65 kg)      Lucius Krabbe, NP

## 2024-06-05 ENCOUNTER — Other Ambulatory Visit: Payer: Self-pay

## 2024-06-07 ENCOUNTER — Other Ambulatory Visit (HOSPITAL_BASED_OUTPATIENT_CLINIC_OR_DEPARTMENT_OTHER): Payer: Self-pay

## 2024-06-11 ENCOUNTER — Other Ambulatory Visit: Payer: Self-pay

## 2024-06-11 ENCOUNTER — Other Ambulatory Visit (HOSPITAL_BASED_OUTPATIENT_CLINIC_OR_DEPARTMENT_OTHER): Payer: Self-pay

## 2024-06-17 ENCOUNTER — Other Ambulatory Visit (HOSPITAL_BASED_OUTPATIENT_CLINIC_OR_DEPARTMENT_OTHER): Payer: Self-pay

## 2024-06-18 ENCOUNTER — Telehealth: Payer: Self-pay | Admitting: Pharmacist

## 2024-06-18 NOTE — Telephone Encounter (Signed)
 Called to check in with patient. She is seeing endo with [facility].  Since she has been back on insulin  her blood glucose and A1c has improved.  See Continuous Glucose Monitor report below.  Shelby Rubio report she is not having any issues with getting insulin . She is hoping to transition over to using an insulin  pump if her insurance approves it.   No additional intervention needed at this time.

## 2024-07-04 ENCOUNTER — Other Ambulatory Visit (HOSPITAL_BASED_OUTPATIENT_CLINIC_OR_DEPARTMENT_OTHER): Payer: Self-pay

## 2024-07-05 ENCOUNTER — Other Ambulatory Visit (HOSPITAL_BASED_OUTPATIENT_CLINIC_OR_DEPARTMENT_OTHER): Payer: Self-pay

## 2024-07-05 MED ORDER — INSULIN LISPRO 100 UNIT/ML IJ SOLN
INTRAMUSCULAR | 5 refills | Status: AC
  Filled 2024-07-05: qty 30, 30d supply, fill #0

## 2024-07-08 ENCOUNTER — Other Ambulatory Visit (HOSPITAL_BASED_OUTPATIENT_CLINIC_OR_DEPARTMENT_OTHER): Payer: Self-pay

## 2024-07-09 DIAGNOSIS — E1065 Type 1 diabetes mellitus with hyperglycemia: Secondary | ICD-10-CM | POA: Diagnosis not present

## 2024-07-15 ENCOUNTER — Other Ambulatory Visit (HOSPITAL_BASED_OUTPATIENT_CLINIC_OR_DEPARTMENT_OTHER): Payer: Self-pay

## 2024-07-23 ENCOUNTER — Ambulatory Visit (INDEPENDENT_AMBULATORY_CARE_PROVIDER_SITE_OTHER): Admitting: Family

## 2024-07-23 ENCOUNTER — Other Ambulatory Visit (HOSPITAL_BASED_OUTPATIENT_CLINIC_OR_DEPARTMENT_OTHER): Payer: Self-pay

## 2024-07-23 ENCOUNTER — Encounter: Payer: Self-pay | Admitting: Family

## 2024-07-23 ENCOUNTER — Ambulatory Visit: Admitting: Family

## 2024-07-23 VITALS — BP 102/60 | HR 77 | Temp 98.6°F | Ht 64.0 in | Wt 171.2 lb

## 2024-07-23 DIAGNOSIS — E1165 Type 2 diabetes mellitus with hyperglycemia: Secondary | ICD-10-CM | POA: Diagnosis not present

## 2024-07-23 DIAGNOSIS — Z794 Long term (current) use of insulin: Secondary | ICD-10-CM

## 2024-07-23 DIAGNOSIS — F411 Generalized anxiety disorder: Secondary | ICD-10-CM

## 2024-07-23 MED ORDER — BUSPIRONE HCL 5 MG PO TABS
5.0000 mg | ORAL_TABLET | Freq: Three times a day (TID) | ORAL | 1 refills | Status: AC | PRN
  Filled 2024-07-23: qty 30, 5d supply, fill #0
  Filled 2024-08-14: qty 30, 5d supply, fill #1

## 2024-07-23 NOTE — Progress Notes (Signed)
 "  Patient ID: Shelby Rubio, female    DOB: 12-07-2000, 23 y.o.   MRN: [id number]  Chief Complaint  Patient presents with   Type 1 diabetes mellitus with hyperglycemia    Follow up   Discussed the use of AI scribe software for clinical note transcription with the patient, who gave verbal consent to proceed.  History of Present Illness Shelby Rubio is a 23 year old female with diabetes who presents with issues related to her insulin  pump and glucose monitoring.  She started using an insulin  pump two weeks ago and since then her glucose levels have been as high as 300 mg/dL despite unchanged diet. She reports repeated cannula problems, including failure to fill, bending, and peeling off, which led her to resume insulin  pens. On pens her glucose has been more stable. She remains in contact with her dietitian and endocrinology team and has followed their pump instructions and suggested adjustments, but the hyperglycemia persisted. She is unsure if the problem is the pump tubing or if her carbohydrate-to-insulin  ratio needs changing. For the past three days her Dexcom has shown inaccurate readings compared with fingersticks, sometimes higher and sometimes lower, and calibration has not corrected the issue. Her last A1c was 6.8% in October. She is currently managing her diabetes with insulin  pens and notes increased stress related to the holidays and her daughter's upcoming birthday.  Assessment & Plan Type 2 diabetes mellitus Insulin  pump and Dexcom sensor malfunctioning, affecting glucose control. A1c at 6.8% in October. Stress impacting diabetes management. - Continue insulin  pens until pump issues resolved. - Contact endocrinology and/or dietician for pump and sensor assistance. - Sample for Dexcom-7 provided to replace current defective sensor. - Follow up with dietitian next Tuesday. - Continue to follow with ENDO  Generalized anxiety disorder Increased anxiety due to stressors and ineffective  previous medications. Buspirone  considered. - Prescribed Buspirone  5-10 mg tid as needed, daily morning dose recommended. - Monitor for headache and dizziness. - Call if any SE or med not working.   Subjective:    Outpatient Medications Prior to Visit  Medication Sig Dispense Refill   Blood Glucose Monitoring Suppl (FREESTYLE LITE) w/Device KIT Use in the morning, at noon, and at bedtime. 1 kit 0   Continuous Glucose Sensor (DEXCOM G7 SENSOR) MISC Use to check blood glucose continuously. Change sensor every 10 days. 3 each 2   Continuous Glucose Sensor (DEXCOM G7 SENSOR) MISC Use to check glucose continuosly, changing sensor every 10 days. 3 each 2   Continuous Glucose Sensor (DEXCOM G7 SENSOR) MISC Apply 1 sensor every 10 days 3 each 5   gabapentin  (NEURONTIN ) 100 MG capsule Take 1 capsule (100 mg total) by mouth at bedtime. 30 capsule 5   Glucagon  (BAQSIMI  TWO PACK) 3 MG/DOSE POWD Use 1 spray in 1 nostril once as directed for severe low blood sugar (hypoglycemia). 1 each 11   Glucose Blood (BLOOD GLUCOSE TEST STRIPS) STRP Use 1 each in the morning, at noon, and at bedtime. 100 each 3   insulin  glargine (LANTUS ) 100 UNIT/ML Solostar Pen Inject 33-36 Units into the skin at bedtime. 9 mL 1   Insulin  Glargine Solostar (LANTUS ) 100 UNIT/ML Solostar Pen Inject 27 units every night, max daily dose of 50 units 15 mL 5   insulin  lispro (HUMALOG ) 100 UNIT/ML injection To be used with insulin  pump up to 100 units a day 30 mL 5   insulin  lispro (HUMALOG ) 100 UNIT/ML KwikPen Inject 12 Units into the skin  3 (three) times daily with meals. 9 mL 1   insulin  lispro (HUMALOG ) 100 UNIT/ML KwikPen Take 12 units prior to breakfast and lunch, take 10 units before dinner.  Max daily dose of 50 units/day 15 mL 5   Insulin  Pen Needle 31G X 8 MM MISC Use to inject insulin  4 times a day as instructed 150 each 3   Insulin  Pen Needle 32G X 4 MM MISC Use 1 pen needle once daily. 100 each 2   Lancets (FREESTYLE) lancets  Use 1 each in the morning, at noon, and at bedtime. 100 each 0   levonorgestrel  (MIRENA ) 20 MCG/DAY IUD 1 each by Intrauterine route once.     No facility-administered medications prior to visit.   Past Medical History:  Diagnosis Date   Abnormal menses 06/06/2018   Anxiety 2011?   When diagnosed depressed   Asthma    When i was little   Behavior disorder    Diabetes mellitus without complication (HCC) 09/2021   Type 2   Dizziness 03/01/2019   History of hypoglycemia 03/01/2019   Hyperosmolar hyperglycemic state (HHS) (HCC) 03/27/2022   Hypokalemia 03/15/2024   Lower abdominal pain 06/06/2018   Obesity (BMI 30-39.9) 03/27/2022   Refused influenza vaccine 06/06/2018   Type 2 diabetes mellitus with hyperglycemia, with long-term current use of insulin  (HCC) 05/10/2024   Type 2 diabetes mellitus with obesity 09/09/2021   DX in 09/2021, pt has struggled w/med compliance, but has lost weight on her own     Past Surgical History:  Procedure Laterality Date   ADENOIDECTOMY     TONSILLECTOMY     tubes in ears     Allergies[1]    Objective:    Physical Exam Vitals and nursing note reviewed.  Constitutional:      Appearance: Normal appearance.  Cardiovascular:     Rate and Rhythm: Normal rate and regular rhythm.  Pulmonary:     Effort: Pulmonary effort is normal.     Breath sounds: Normal breath sounds.  Musculoskeletal:        General: Normal range of motion.  Skin:    General: Skin is warm and dry.  Neurological:     Mental Status: She is alert.  Psychiatric:        Mood and Affect: Mood normal.        Behavior: Behavior normal.    BP 102/60 (BP Location: Left Arm, Patient Position: Sitting, Cuff Size: Normal)   Pulse 77   Temp 98.6 F (37 C) (Temporal)   Ht 5' 4 (1.626 m)   Wt 171 lb 3.2 oz (77.7 kg)   LMP 07/02/2024 (Exact Date)   SpO2 99%   BMI 29.39 kg/m  Wt Readings from Last 3 Encounters:  07/23/24 171 lb 3.2 oz (77.7 kg)  05/28/24 169 lb 3.2 oz (76.7  kg)  03/20/24 157 lb 6.4 oz (71.4 kg)      Leonilda Cozby, NP      [1]  Allergies Allergen Reactions   Penicillins Hives   Amoxil [Amoxicillin] Hives   "

## 2024-08-08 ENCOUNTER — Other Ambulatory Visit (HOSPITAL_BASED_OUTPATIENT_CLINIC_OR_DEPARTMENT_OTHER): Payer: Self-pay

## 2024-08-08 ENCOUNTER — Other Ambulatory Visit: Payer: Self-pay

## 2024-08-14 ENCOUNTER — Other Ambulatory Visit (HOSPITAL_BASED_OUTPATIENT_CLINIC_OR_DEPARTMENT_OTHER): Payer: Self-pay

## 2024-08-19 ENCOUNTER — Other Ambulatory Visit (HOSPITAL_BASED_OUTPATIENT_CLINIC_OR_DEPARTMENT_OTHER): Payer: Self-pay

## 2025-01-21 ENCOUNTER — Ambulatory Visit: Admitting: Family
